# Patient Record
Sex: Female | Born: 1975
Health system: Southern US, Community
[De-identification: ages and names within clinical notes are randomized; demographics above are authoritative.]

## PROBLEM LIST (undated history)

## (undated) DIAGNOSIS — E039 Hypothyroidism, unspecified: Secondary | ICD-10-CM

## (undated) DIAGNOSIS — E119 Type 2 diabetes mellitus without complications: Secondary | ICD-10-CM

## (undated) DIAGNOSIS — C8190 Hodgkin lymphoma, unspecified, unspecified site: Secondary | ICD-10-CM

## (undated) DIAGNOSIS — J984 Other disorders of lung: Secondary | ICD-10-CM

## (undated) DIAGNOSIS — C801 Malignant (primary) neoplasm, unspecified: Secondary | ICD-10-CM

## (undated) DIAGNOSIS — I509 Heart failure, unspecified: Secondary | ICD-10-CM

## (undated) DIAGNOSIS — Z9889 Other specified postprocedural states: Secondary | ICD-10-CM

## (undated) DIAGNOSIS — B9689 Other specified bacterial agents as the cause of diseases classified elsewhere: Secondary | ICD-10-CM

## (undated) DIAGNOSIS — T4145XA Adverse effect of unspecified anesthetic, initial encounter: Secondary | ICD-10-CM

## (undated) DIAGNOSIS — T8859XA Other complications of anesthesia, initial encounter: Secondary | ICD-10-CM

## (undated) DIAGNOSIS — N760 Acute vaginitis: Secondary | ICD-10-CM

## (undated) DIAGNOSIS — I10 Essential (primary) hypertension: Secondary | ICD-10-CM

## (undated) DIAGNOSIS — R112 Nausea with vomiting, unspecified: Secondary | ICD-10-CM

## (undated) DIAGNOSIS — Z9289 Personal history of other medical treatment: Secondary | ICD-10-CM

## (undated) DIAGNOSIS — Z87898 Personal history of other specified conditions: Secondary | ICD-10-CM

## (undated) DIAGNOSIS — K635 Polyp of colon: Secondary | ICD-10-CM

## (undated) DIAGNOSIS — F329 Major depressive disorder, single episode, unspecified: Secondary | ICD-10-CM

## (undated) DIAGNOSIS — C4490 Unspecified malignant neoplasm of skin, unspecified: Secondary | ICD-10-CM

## (undated) DIAGNOSIS — E559 Vitamin D deficiency, unspecified: Secondary | ICD-10-CM

## (undated) DIAGNOSIS — F32A Depression, unspecified: Secondary | ICD-10-CM

## (undated) HISTORY — DX: Hypothyroidism, unspecified: E03.9

## (undated) HISTORY — DX: Polyp of colon: K63.5

## (undated) HISTORY — DX: Personal history of other specified conditions: Z87.898

## (undated) HISTORY — DX: Other disorders of lung: J98.4

## (undated) HISTORY — PX: INTRAUTERINE DEVICE (IUD) INSERTION: SHX5877

## (undated) HISTORY — DX: Other specified bacterial agents as the cause of diseases classified elsewhere: B96.89

## (undated) HISTORY — PX: APPENDECTOMY: SHX54

## (undated) HISTORY — DX: Other specified bacterial agents as the cause of diseases classified elsewhere: N76.0

## (undated) HISTORY — DX: Vitamin D deficiency, unspecified: E55.9

## (undated) HISTORY — PX: THYROIDECTOMY: SHX17

## (undated) HISTORY — PX: CHOLECYSTECTOMY: SHX55

## (undated) HISTORY — DX: Malignant (primary) neoplasm, unspecified: C80.1

## (undated) HISTORY — PX: SPLENECTOMY: SUR1306

## (undated) HISTORY — DX: Personal history of other medical treatment: Z92.89

## (undated) HISTORY — DX: Unspecified malignant neoplasm of skin, unspecified: C44.90

## (undated) HISTORY — DX: Depression, unspecified: F32.A

## (undated) HISTORY — DX: Major depressive disorder, single episode, unspecified: F32.9

---
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
## 2011-04-09 ENCOUNTER — Ambulatory Visit: Payer: Self-pay | Admitting: Family Medicine

## 2011-06-08 DIAGNOSIS — Z87898 Personal history of other specified conditions: Secondary | ICD-10-CM

## 2011-06-08 HISTORY — DX: Personal history of other specified conditions: Z87.898

## 2011-09-09 ENCOUNTER — Inpatient Hospital Stay: Payer: Self-pay | Admitting: Psychiatry

## 2011-09-09 LAB — CBC
HCT: 44.6 % (ref 35.0–47.0)
MCHC: 33.3 g/dL (ref 32.0–36.0)
MCV: 93 fL (ref 80–100)
Platelet: 381 10*3/uL (ref 150–440)
RDW: 13.4 % (ref 11.5–14.5)
WBC: 11.6 10*3/uL — ABNORMAL HIGH (ref 3.6–11.0)

## 2011-09-09 LAB — BASIC METABOLIC PANEL
Anion Gap: 10 (ref 7–16)
BUN: 9 mg/dL (ref 7–18)
Calcium, Total: 8.7 mg/dL (ref 8.5–10.1)
Chloride: 101 mmol/L (ref 98–107)
Creatinine: 0.45 mg/dL — ABNORMAL LOW (ref 0.60–1.30)
EGFR (African American): >60
EGFR (Non-African Amer.): >60
Glucose: 145 mg/dL — ABNORMAL HIGH (ref 65–99)
Osmolality: 273 (ref 275–301)

## 2012-09-25 ENCOUNTER — Ambulatory Visit: Payer: Self-pay | Admitting: Family Medicine

## 2013-03-15 LAB — TSH: TSH: 0.09 u[IU]/mL — AB (ref 0.41–5.90)

## 2013-04-05 DIAGNOSIS — C8110 Nodular sclerosis classical Hodgkin lymphoma, unspecified site: Secondary | ICD-10-CM | POA: Insufficient documentation

## 2013-04-05 DIAGNOSIS — E039 Hypothyroidism, unspecified: Secondary | ICD-10-CM | POA: Insufficient documentation

## 2014-03-25 ENCOUNTER — Ambulatory Visit: Payer: Self-pay | Admitting: Family Medicine

## 2014-04-08 LAB — TROPONIN I: Troponin-I: <0.02 ng/mL

## 2014-04-08 LAB — BASIC METABOLIC PANEL
Anion Gap: 9 (ref 7–16)
BUN: 12 mg/dL (ref 7–18)
CREATININE: 0.65 mg/dL (ref 0.60–1.30)
Calcium, Total: 8.6 mg/dL (ref 8.5–10.1)
Chloride: 105 mmol/L (ref 98–107)
Co2: 24 mmol/L (ref 21–32)
EGFR (African American): >60
EGFR (Non-African Amer.): >60
GLUCOSE: 173 mg/dL — AB (ref 65–99)
Osmolality: 280 (ref 275–301)
Potassium: 4.2 mmol/L (ref 3.5–5.1)
Sodium: 138 mmol/L (ref 136–145)

## 2014-04-08 LAB — CBC
HCT: 40.4 %
HGB: 13 g/dL
MCH: 29.3 pg
MCHC: 32.2 g/dL
MCV: 91 fL
Platelet: 481 x10 3/mm 3 — ABNORMAL HIGH
RBC: 4.44 X10 6/mm 3
RDW: 15 % — ABNORMAL HIGH
WBC: 20.4 x10 3/mm 3 — ABNORMAL HIGH

## 2014-04-08 LAB — D-DIMER(ARMC): D-Dimer: 1044 ng/ml

## 2014-04-09 ENCOUNTER — Inpatient Hospital Stay: Payer: Self-pay | Admitting: Internal Medicine

## 2014-04-09 LAB — INFLUENZA A,B,H1N1 - PCR (ARMC)
H1N1 flu by pcr: NOT DETECTED
Influenza A By PCR: NEGATIVE
Influenza B By PCR: NEGATIVE

## 2014-04-09 LAB — PRO B NATRIURETIC PEPTIDE: B-Type Natriuretic Peptide: 323 pg/mL — ABNORMAL HIGH (ref 0–125)

## 2014-04-10 DIAGNOSIS — I351 Nonrheumatic aortic (valve) insufficiency: Secondary | ICD-10-CM

## 2014-04-10 LAB — CBC WITH DIFFERENTIAL/PLATELET
BASOS PCT: 0.4 %
Basophil #: 0.1 10*3/uL (ref 0.0–0.1)
EOS PCT: 0 %
Eosinophil #: 0 10*3/uL (ref 0.0–0.7)
HCT: 35.8 % (ref 35.0–47.0)
HGB: 11.6 g/dL — ABNORMAL LOW (ref 12.0–16.0)
Lymphocyte #: 2.4 10*3/uL (ref 1.0–3.6)
Lymphocyte %: 6.5 %
MCH: 29.1 pg (ref 26.0–34.0)
MCHC: 32.4 g/dL (ref 32.0–36.0)
MCV: 90 fL (ref 80–100)
MONO ABS: 1.4 x10 3/mm — AB (ref 0.2–0.9)
Monocyte %: 4 %
NEUTROS ABS: 32.6 10*3/uL — AB (ref 1.4–6.5)
Neutrophil %: 89.1 %
Platelet: 444 10*3/uL — ABNORMAL HIGH (ref 150–440)
RBC: 3.99 10*6/uL (ref 3.80–5.20)
RDW: 14.9 % — ABNORMAL HIGH (ref 11.5–14.5)
WBC: 36.5 10*3/uL — AB (ref 3.6–11.0)

## 2014-04-10 LAB — COMPREHENSIVE METABOLIC PANEL
ALK PHOS: 74 U/L
ALT: 32 U/L
Albumin: 3 g/dL — ABNORMAL LOW (ref 3.4–5.0)
Anion Gap: 8 (ref 7–16)
BILIRUBIN TOTAL: 0.4 mg/dL (ref 0.2–1.0)
BUN: 14 mg/dL (ref 7–18)
CHLORIDE: 108 mmol/L — AB (ref 98–107)
Calcium, Total: 8.1 mg/dL — ABNORMAL LOW (ref 8.5–10.1)
Co2: 25 mmol/L (ref 21–32)
Creatinine: 0.65 mg/dL (ref 0.60–1.30)
EGFR (Non-African Amer.): >60
Glucose: 169 mg/dL — ABNORMAL HIGH (ref 65–99)
Osmolality: 286 (ref 275–301)
Potassium: 3.7 mmol/L (ref 3.5–5.1)
SGOT(AST): 19 U/L (ref 15–37)
Sodium: 141 mmol/L (ref 136–145)
Total Protein: 6.9 g/dL (ref 6.4–8.2)

## 2014-04-10 LAB — MAGNESIUM: Magnesium: 2 mg/dL

## 2014-04-10 LAB — PHOSPHORUS: PHOSPHORUS: 2.5 mg/dL (ref 2.5–4.9)

## 2014-04-11 ENCOUNTER — Other Ambulatory Visit: Payer: Self-pay | Admitting: Physician Assistant

## 2014-04-11 DIAGNOSIS — I5023 Acute on chronic systolic (congestive) heart failure: Secondary | ICD-10-CM

## 2014-04-11 DIAGNOSIS — J9600 Acute respiratory failure, unspecified whether with hypoxia or hypercapnia: Secondary | ICD-10-CM

## 2014-04-11 LAB — PHOSPHORUS: Phosphorus: 2.9 mg/dL (ref 2.5–4.9)

## 2014-04-11 LAB — BASIC METABOLIC PANEL
ANION GAP: 7 (ref 7–16)
BUN: 18 mg/dL (ref 7–18)
CO2: 28 mmol/L (ref 21–32)
Calcium, Total: 8.3 mg/dL — ABNORMAL LOW (ref 8.5–10.1)
Chloride: 103 mmol/L (ref 98–107)
Creatinine: 0.59 mg/dL — ABNORMAL LOW (ref 0.60–1.30)
EGFR (Non-African Amer.): >60
GLUCOSE: 297 mg/dL — AB (ref 65–99)
OSMOLALITY: 289 (ref 275–301)
Potassium: 4.3 mmol/L (ref 3.5–5.1)
SODIUM: 138 mmol/L (ref 136–145)

## 2014-04-11 LAB — EXPECTORATED SPUTUM ASSESSMENT W REFEX TO RESP CULTURE

## 2014-04-11 LAB — VANCOMYCIN, TROUGH: VANCOMYCIN, TROUGH: 9 ug/mL — AB (ref 10–20)

## 2014-04-11 LAB — MAGNESIUM: Magnesium: 1.9 mg/dL

## 2014-04-12 LAB — CBC WITH DIFFERENTIAL/PLATELET
Basophil #: 0.1 10*3/uL (ref 0.0–0.1)
Basophil %: 0.6 %
EOS PCT: 0 %
Eosinophil #: 0 10*3/uL (ref 0.0–0.7)
HCT: 36.5 % (ref 35.0–47.0)
HGB: 11.8 g/dL — ABNORMAL LOW (ref 12.0–16.0)
Lymphocyte #: 1.6 10*3/uL (ref 1.0–3.6)
Lymphocyte %: 6.5 %
MCH: 28.5 pg (ref 26.0–34.0)
MCHC: 32.2 g/dL (ref 32.0–36.0)
MCV: 89 fL (ref 80–100)
Monocyte #: 1.5 x10 3/mm — ABNORMAL HIGH (ref 0.2–0.9)
Monocyte %: 5.8 %
NEUTROS ABS: 22 10*3/uL — AB (ref 1.4–6.5)
Neutrophil %: 87.1 %
PLATELETS: 452 10*3/uL — AB (ref 150–440)
RBC: 4.12 10*6/uL (ref 3.80–5.20)
RDW: 14.5 % (ref 11.5–14.5)
WBC: 25.3 10*3/uL — ABNORMAL HIGH (ref 3.6–11.0)

## 2014-04-12 LAB — CULTURE, BLOOD (SINGLE)

## 2014-04-12 LAB — HEMOGLOBIN A1C: HEMOGLOBIN A1C: 8.3 % — AB (ref 4.2–6.3)

## 2014-04-13 LAB — CBC WITH DIFFERENTIAL/PLATELET
BASOS PCT: 0.3 %
Basophil #: 0.1 10*3/uL (ref 0.0–0.1)
EOS ABS: 0.1 10*3/uL (ref 0.0–0.7)
Eosinophil %: 0.5 %
HCT: 34 % — ABNORMAL LOW (ref 35.0–47.0)
HGB: 11.3 g/dL — AB (ref 12.0–16.0)
Lymphocyte #: 6.9 10*3/uL — ABNORMAL HIGH (ref 1.0–3.6)
Lymphocyte %: 30.1 %
MCH: 29 pg (ref 26.0–34.0)
MCHC: 33.4 g/dL (ref 32.0–36.0)
MCV: 87 fL (ref 80–100)
MONOS PCT: 11 %
Monocyte #: 2.5 x10 3/mm — ABNORMAL HIGH (ref 0.2–0.9)
NEUTROS PCT: 58.1 %
Neutrophil #: 13.3 10*3/uL — ABNORMAL HIGH (ref 1.4–6.5)
Platelet: 466 10*3/uL — ABNORMAL HIGH (ref 150–440)
RBC: 3.9 10*6/uL (ref 3.80–5.20)
RDW: 14.7 % — ABNORMAL HIGH (ref 11.5–14.5)
WBC: 22.9 10*3/uL — ABNORMAL HIGH (ref 3.6–11.0)

## 2014-04-13 LAB — BASIC METABOLIC PANEL
Anion Gap: 6 — ABNORMAL LOW (ref 7–16)
BUN: 20 mg/dL — AB (ref 7–18)
CHLORIDE: 100 mmol/L (ref 98–107)
CREATININE: 0.6 mg/dL (ref 0.60–1.30)
Calcium, Total: 8.1 mg/dL — ABNORMAL LOW (ref 8.5–10.1)
Co2: 35 mmol/L — ABNORMAL HIGH (ref 21–32)
EGFR (African American): >60
Glucose: 142 mg/dL — ABNORMAL HIGH (ref 65–99)
OSMOLALITY: 286 (ref 275–301)
POTASSIUM: 3.1 mmol/L — AB (ref 3.5–5.1)
Sodium: 141 mmol/L (ref 136–145)

## 2014-04-14 LAB — CBC WITH DIFFERENTIAL/PLATELET
Basophil #: 0.1 x10 3/mm 3
Basophil %: 0.4 %
Eosinophil #: 0.5 x10 3/mm 3
Eosinophil %: 2.7 %
HCT: 37.7 %
HGB: 12.4 g/dL
Lymphocyte %: 35.8 %
Lymphs Abs: 6.8 x10 3/mm 3 — ABNORMAL HIGH
MCH: 29.2 pg
MCHC: 33 g/dL
MCV: 89 fL
Monocyte #: 1.9 "x10 3/mm " — ABNORMAL HIGH
Monocyte %: 9.7 %
Neutrophil #: 9.8 x10 3/mm 3 — ABNORMAL HIGH
Neutrophil %: 51.4 %
Platelet: 503 x10 3/mm 3 — ABNORMAL HIGH
RBC: 4.26 X10 6/mm 3
RDW: 14.5 %
WBC: 19.1 x10 3/mm 3 — ABNORMAL HIGH

## 2014-04-14 LAB — BASIC METABOLIC PANEL WITH GFR
Anion Gap: 8
BUN: 18 mg/dL
Calcium, Total: 8.3 mg/dL — ABNORMAL LOW
Chloride: 98 mmol/L
Co2: 32 mmol/L
Creatinine: 0.61 mg/dL
EGFR (African American): >60
EGFR (Non-African Amer.): >60
Glucose: 199 mg/dL — ABNORMAL HIGH
Osmolality: 283
Potassium: 4.1 mmol/L
Sodium: 138 mmol/L

## 2014-04-14 LAB — CULTURE, BLOOD (SINGLE)

## 2014-05-20 LAB — BASIC METABOLIC PANEL
BUN: 18 mg/dL (ref 4–21)
Creatinine: 0.7 mg/dL (ref 0.5–1.1)
Glucose: 180 mg/dL
POTASSIUM: 5 mmol/L (ref 3.4–5.3)
Sodium: 137 mmol/L (ref 137–147)

## 2014-05-20 LAB — HEPATIC FUNCTION PANEL
ALT: 17 U/L (ref 7–35)
AST: 19 U/L (ref 13–35)
Alkaline Phosphatase: 92 U/L (ref 25–125)
BILIRUBIN, TOTAL: 0.2 mg/dL

## 2014-05-20 LAB — CBC AND DIFFERENTIAL
HEMATOCRIT: 39 % (ref 36–46)
HEMOGLOBIN: 13.8 g/dL (ref 12.0–16.0)
NEUTROS ABS: 11 /uL
PLATELETS: 474 10*3/uL — AB (ref 150–399)
WBC: 16.7 10*3/mL

## 2014-05-23 DIAGNOSIS — I427 Cardiomyopathy due to drug and external agent: Secondary | ICD-10-CM | POA: Insufficient documentation

## 2014-05-23 DIAGNOSIS — I5022 Chronic systolic (congestive) heart failure: Secondary | ICD-10-CM | POA: Insufficient documentation

## 2014-09-28 NOTE — Discharge Summary (Signed)
PATIENT NAME:  Lori [name], Lori [name] MR#:  [id number] DATE OF BIRTH:  07/20/1975  DATE OF ADMISSION:  04/09/2014 DATE OF DISCHARGE:  04/14/2014  PRESENTING COMPLAINT: Shortness of breath and wheezing.   DISCHARGE DIAGNOSES:  1.  Community-acquired multifocal pneumonia.  2.  Acute ventilatory-dependent respiratory failure, resolved.  3.  Congestive heart failure, systolic, with an ejection fraction of 35%.  4.  History of Hodgkin lymphoma status post radiation therapy 25 years ago, followed at [facility].  5.  Diabetes mellitus type 2. A1c 8.4.  6.  Hypothyroidism.  7.  Anxiety and depression.   CONSULTATIONS: 1.  Mariane Duval, MD, pulmonology.  2.  Minna Merritts, MD, cardiology.  3.  Muhammad A. Fletcher Anon, MD, cardiology.  4.  Minus Breeding, MD, cardiology.   PROCEDURES: 1.  Chest x-ray November 2 shows bibasilar interstitial and alveolar infiltrates suggesting pneumonia.  2.  CT angiography of the chest for PE 04/09/2014 shows no evidence for pulmonary embolus. Patchy bilateral air-space disease with small bilateral pleural effusions. Findings suggestive for multifocal pneumonia. Mediastinal, hilar, and supraclavicular lymphadenopathy. Unclear if this chest lymphadenopathy is chronic or represents an acute change. Cannot exclude recurrent neoplastic process based on this image.  3.  Chest x-ray 04/10/2014 shows significant improvement in the degree of central vascular congestion.  4.  A 2-D echocardiogram November 4 shows left ventricular ejection fraction 30% to 35%. Moderately to severely decreased global left ventricular systolic function. Elevated left atrial and left ventricular end-diastolic pressures. Pseudonormal pattern of left ventricular diastolic filling. Moderate concentric left ventricular hypertrophy. Moderate to severe mitral valve regurgitation.   HISTORY OF PRESENT ILLNESS: This very pleasant 39 year old woman with past medical history of Hodgkin lymphoma status post  radiation 25 years ago in complete remission presents with complaint of shortness of breath and wheezing ongoing for approximately 3 months. Prior to presentation, she had been given a course of oral antibiotics by her oncologist and was scheduled to have a pulmonary workup. In the Emergency Room, she was found to have an elevated white blood cell count of 21,000 and positive D-dimer at 1,044. Chest x-ray was positive for pneumonia and CT of the chest supported pneumonia with possible pulmonary vascular congestion. She was started on IV antibiotics and also received 3 L of IV fluids. She then became acutely hypoxic and in respiratory distress. She was started on BiPAP but did not tolerate BiPAP. She was placed on mechanical ventilation and admitted to the ICU. The family requested transfer to [facility], transfer request was made. Unfortunately, at the time there were no beds available.  HOSPITAL COURSE BY PROBLEM:  1.  Community-acquired multifocal pneumonia: Blood and sputum cultures were negative. The patient was treated initially with Rocephin and azithromycin and is discharged on Levaquin. She will complete a full 10-day course of antibiotics. At the time of discharge she is afebrile, oxygen saturation is greater than 90% on room air. Her white count is trending downwards. She shows no signs of continued infection. She will be following up with pulmonology at [facility] next week. 2.  Acute ventilatory-dependent respiratory failure with hypoxia: This was most likely due to volume overload in the setting of systolic congestive heart failure with an ejection fraction of 35%. She responded well to diuresis. She was intubated on November 3 and extubated on November 5 without complication. She was followed by pulmonology, critical care. At the time of discharge, she is breathing easily on room air with oxygen saturations above 90%. She does not need  supplemental oxygen. 3.  Congestive heart failure, chronic, with an  ejection fraction of 35% by 2-D echocardiogram performed this admission: The patient was followed by cardiology during her admission. She did have acute on chronic systolic heart failure, likely due to volume resuscitation in the Emergency Room. Her left ventricular function has decreased since 2014, when her EF was 50%. A recent echocardiogram on 04/04/2014 showed ejection fraction of 35%, which was consistent with the echocardiogram performed during this admission. Her systolic dysfunction is likely due to history of mantle radiation and anthracycline exposure for Hodgkin lymphoma. She will continue with Lasix 40 mg b.i.d., lisinopril 10 mg daily, Coreg 6.25 mg b.i.d. She will follow up with [facility] next week. She will likely need an ischemic evaluation as an outpatient. 4.  History of Hodgkin's lymphoma status post radiation. The patient will continue to follow up with [facility]. Note that the CTA performed during this admission showed multiple reactive lymph nodes, most likely related to community-acquired pneumonia but malignant process cannot be excluded.  5.  Diabetes mellitus type 2 with A1c of 8.4: In the hospital she was covered with Levemir and sliding scale. Due to her fairly well-controlled blood sugars as an outpatient and decreasing blood sugars at the time of discharge, she is discharged with no changes to her home regimen. She will follow up with her endocrinologist.  6.  Hypothyroidism: Stable, no changes.  7.  Anxiety and depression: Stable, no changes to home regimen.   DISCHARGE PHYSICAL EXAMINATION:  VITAL SIGNS: Temperature 97.5, pulse 97, respirations 20, blood pressure 102/68, oxygenation 96% on room air.  GENERAL: No acute distress.  CARDIOVASCULAR: Regular rate and rhythm. No murmurs, rubs, or gallops. No peripheral edema. Peripheral pulses are 2+.  PULMONARY: Lungs are clear to auscultation bilaterally with good air movement. No respiratory distress.  ABDOMEN: Soft,  nontender, nondistended. Bowel sounds are normal.  PSYCHIATRIC: Alert, oriented, good insight into her clinical condition. No signs of uncontrolled anxiety or depression.   DISCHARGE LABORATORY DATA: Sodium 138, potassium 4.1, chloride 98, bicarbonate 32, BUN 18, creatinine 0.61, blood glucose 141. White blood cells 19.1, hemoglobin 12.4, hematocrit 37.7, platelets 503,000, MCV 89.  PERTINENT LABORATORY DURING ADMISSION: Influenza A, B, H1N1 negative. Blood and sputum cultures negative. Hemoglobin A1c 8.4. BNP on presentation 323.   DISCHARGE MEDICATIONS:  1.  Levothyroxine 125 mcg 1 tablet daily.  2.  Trazodone 100 mg 1 tablet daily at bedtime.  3.  Sertraline 100 mg 2 tablets once a day.  4.  Metformin 1000 mg 1 tablet twice a day.  5.  Sitagliptin 50 mg 1 tablet twice a day.  6.  Lisinopril 10 mg 1 tablet once a day.  7.  Carvedilol 6.25 mg 1 tablet twice a day.  8.  Furosemide 40 mg 1 tablet twice a day.  9.  Guaifenesin 60 mg 1 tablet every 12 hours as needed for cough.  10.  Victoza 18 mg/3 mL subcutaneous solution 1.2 mg subcutaneously once a day.  11.  Levaquin 500 mg 1 tablet every 24 hours for 5 days.   CONDITION ON DISCHARGE: Stable.   DISPOSITION: Discharged to home with no home health needs.   DISCHARGE INSTRUCTIONS:  DIET: Low-sodium, low-fat, low-cholesterol diet.   ACTIVITY: No specific limitations: Return to work after 1 week.  TIMEFRAME FOR FOLLOWUP: The patient has followup appointments scheduled within the week at [facility] and [facility]. She will follow up within 1 to 2 weeks with her primary care  physician.   She will need to have BMP drawn this week to check renal function and electrolytes in the setting of new medications.  TIME SPENT ON DISCHARGE: 45 minutes.     ____________________________ Earleen Newport. Volanda Napoleon, MD cpw:ST D: 04/14/2014 20:58:31 ET T: 04/14/2014 22:00:44 ET JOB#: [id number]  cc: Earleen Newport. Volanda Napoleon, MD, <Dictator> Aldean Jewett MD ELECTRONICALLY SIGNED 04/19/2014 11:45

## 2014-09-28 NOTE — H&P (Signed)
PATIENT NAME:  Lori [name], Lori [name] MR#:  [id number] DATE OF BIRTH:  [birth year]  DATE OF ADMISSION:  04/09/2014  PRIMARY CARE PHYSICIAN:  Meredith Leeds, MD   ONCOLOGIST:  At [facility]  REFERRAL PHYSICIAN:  ED physician, Tally Due, MD   ADMITTING DOCTOR:  Juluis Mire, MD   CHIEF COMPLAINT: 1.  Shortness of breath with wheezing with cough and chest discomfort ongoing for the past 3 months, which worsened since yesterday.  2.  Expectoration of minimal sputum of light yellow color.   HISTORY OF PRESENT ILLNESS:  This is a 39 year old Caucasian female with a past medical history significant for Hodgkin lymphoma status post radiation therapy about 25 years ago with complete remission, history of hypothyroidism, diabetes mellitus type 2, psychiatric disorder with generalized anxiety disorder/depression, who presents to the Emergency Room accompanied by her husband with the complaints of shortness of breath with wheezing ongoing for the past 3 months. According to the patient's husband, the patient has been having shortness of breath with wheezing for the past 3 months, and she has also been having cough with minimal expectoration of light yellow sputum, for which she has seen her oncologist at [facility] last week, was given some oral antibiotics, and is scheduled to have some pulmonary workup next week. Meanwhile, her respiratory symptoms got worse, hence came to the Emergency Room for further evaluation. No history of any fever. She does have some diffuse chest pain because of coughing, otherwise denies any palpitations, dizziness, or loss of consciousness. The patient was acutely sick with acute respiratory distress, but on presentation by the ED physician's evaluation, the patient was not hypoxic. She was saturating at 94% on room air on presentation. The patient was evaluated by the ED physician and was found to have tachycardia with elevated blood pressure with a normal temperature, and  workup revealed a white blood cell count of 21,000 with D-dimer positive for 1044, chest x-ray significant for bilateral multifocal pneumonia, and  CT of the chest significant for bilateral multifocal pneumonia with some possible pulmonary vascular congestion. The patient was given Xopenex nebulizers by the ED physician and was started on IV antibiotics , Rocephin and azithromycin after blood cultures. Pt also received 3 litres of iv fluids in the ER. The hospitalist service was consulted for further evaluation and management. By the time I went to see the patient, the patient was in acute respiratory distress and having hypoxia on room air. She was getting oxygen through nasal cannula but still hypoxic, and she was anxious and agitated and her blood pressure was also elevated with tachycardia, for which she received Lopressor 2.5 mg. She continued to be agitated and hypoxic and did not tolerate the oxygen supplementation through nasal cannula. On examination, the patient was found to have diffuse rhonchi bilaterally with some rales at the bases. Hence, Solu-Medrol 125 mg IV was given, and also the patient was given 40 mg of IV Lasix. The patient was continued on Xopenex nebulizers and continued on oxygen supplementation through nasal cannula but continued to deteriorate with further respiratory distress; hence, respiratory therapist was consulted to start the patient on BiPAP. The patient was started on BiPAP, following which her oxygen saturations improved temporarily, but she could not tolerate BiPAP because of severe anxiety. Stat repeat chest x-ray was obtained in the Emergency Room, which showed bilateral pulmonary vascular congestion, and she was given 40 mg of Lasix IV as noted earlier. The patient continued to have significant respiratory distress  with hypoxia and desaturation; hence, a decision was made to intubate the patient and place on mechanical ventilation. ED physician discussed the patient's  condition with the patient's family and updated the status and they agreed for intubation. The patient's family wanted the patient to be transferred to [facility] for further management since all of her consultants are at [facility] and also up until now she has been getting her care at [facility]. ED physician contacted [facility] on-call and apprised of the patient's condition, and according to the ED physician, [facility] does not have any beds available at this time and they might have a bed available later this afternoon to accept the patient. The same was conveyed to the patient's family by the ED physician and recommended to continue care at [facility] in the ICU for now, for which the patient's family agreed. The patient is placed on mechanical ventilation. Her condition is stable at this time, and she is sedated.   PAST MEDICAL HISTORY: 1.  Hodgkin lymphoma status post radiation treatment about 25 years ago with complete resolution, under care of [facility] oncologist.  2.  Hypothyroidism.  3.  Diabetes mellitus type 2.  4.  History of psychiatric disorder with anxiety and depression.   PAST SURGICAL HISTORY:  Status post appendectomy, status post cholecystectomy, status post splenectomy, status post thyroidectomy, status post lymph node removal.   ALLERGIES:  No known drug allergies.   HOME MEDICATIONS: 1.  Levothyroxine 125 mcg tablet 1 tablet a day.  2.  Metformin 1000 mg tablet 1 tablet orally 2 times a day.  3.  Sertraline 100 mg oral tablet 2 tablets orally once a day.  4.  Sitagliptin 50 mg tablet 1 tablet orally 2 times a day.  5.  Trazodone 100 mg tablet 1 tablet orally once a day.  6.  Azithromycin and Bactrim.   SOCIAL HISTORY:  Married and lives with her husband. History of smoking about 3 to 4 cigarettes per day. Consumes alcohol on social basis. Denies any substance abuse.   FAMILY HISTORY:  Father and sister significant for psychiatric disorder.   REVIEW OF  SYSTEMS:   CONSTITUTIONAL:  Positive for weakness and fatigue for the past 3 months, which kind of worsened in the last 24 to 48 hours.  EYES:  Negative for blurred vision or double vision. No pain. No redness. No inflammation.  EARS, NOSE, AND THROAT:  Negative for tinnitus, ear pain, hearing loss, epistaxis, nasal discharge, or difficulty swallowing.  RESPIRATORY:  Ongoing cough with wheezing and shortness of breath for the past 3 months, which worsened in the past 24 to 48 hours. Chest hurts because of cough. Productive sputum with minimal sputum of light yellow color.  CARDIOVASCULAR:  Chest hurts secondary due to coughing; otherwise, denies any exertional chest pain. No pedal edema. No palpitations. No dizziness or loss of consciousness.  GASTROINTESTINAL:  Negative for nausea, vomiting, diarrhea, abdominal pain, GERD symptoms, melena, or rectal bleeding.  GENITOURINARY:  Negative for dysuria, hematuria, or frequency.  ENDOCRINE:  Negative for polyuria or polydipsia. History of hypothyroidism and takes levothyroxine.  INTEGUMENTARY:  Negative for acne, skin rash, or lesions.  MUSCULOSKELETAL:  Negative for back pain, arthritis, or swelling.  NEUROLOGICAL:  Negative for focal weakness or numbness. No history of CVA, TIA, or seizure disorder.  PSYCHIATRIC:  Positive for depression and anxiety disorder, for which she takes medications on a regular basis.  PHYSICAL EXAMINATION: VITAL SIGNS:  On arrival to the Emergency Room:  Temperature 98.1 degrees Fahrenheit, pulse rate 133 per minute, respirations 22, blood pressure 158/97, oxygen saturation 91% on room air. Further vital signs while in the Emergency Room with hypoxia:  Oxygen saturations on room air were 74% at one time with a heart rate of 152 per minute and respirations of 32 per minute. Current vital signs once intubated:  Pulse rate 129 per minute, respirations 22, blood pressure 102/71, and oxygen saturation 93% on mechanical ventilation.   GENERAL:  Well developed, well nourished. She is in acute respiratory distress, currently intubated. Examination prior to intubation as below. HEAD:  Atraumatic, normocephalic.  EYES:  Pupils are equal and react to light. No conjunctival pallor. No scleral icterus.  NOSE:  There is no drainage.  EARS:  No drainage. No external lesions.  ORAL CAVITY:  No mucosal lesions. No exudates.  NECK:  Supple. No JVD. No thyromegaly. No carotid bruit. Range of motion is normal.  RESPIRATORY:  The patient is in acute respiratory distress, using accessory muscles of respiration. Bilateral rhonchi present. Bilateral rales at the bases present.  CARDIOVASCULAR:  Tachycardia present. No murmurs appreciated. Pulses are equal at carotid, femoral, and pedal pulses. No pedal edema.  GASTROINTESTINAL:  Abdomen is soft and nontender. No hepatosplenomegaly. No tenderness, rigidity, or guarding. Bowel sounds present and equal in all 4 quadrants.  MUSCULOSKELETAL: Moving all 4 limbs well. Range of motion is normal. Strength and tone are equal bilaterally.  SKIN:  Within normal limits.  LYMPHATIC:  No lymphadenopathy.  VASCULAR:  Good dorsalis pedis and posterior tibial pulses.  NEUROLOGIC:  Cranial nerves II through XII are grossly intact. DTRs are 2+ bilaterally. No focal neurological deficits.  PSYCHIATRIC:  The patient is anxious and agitated and in respiratory distress because of hypoxia.   LABORATORY DATA:  Serum glucose 173, BNP 323, BUN 12, creatinine 0.65, sodium 138, potassium 4.2, chloride 105, bicarbonate 24, total calcium 8.6. Troponin less than 0.02. WBC 20.4, hemoglobin 13.0, hematocrit 40.4, platelet count 481,000, MCV 91. D-dimer 1044.    IMAGING:  Initial chest x-ray shows basilar interstitial and alveolar infiltrates suggestive of pneumonia or less likely edema.   Followup chest x-ray in the Emergency Room after intubation shows the endotracheal tube is 5.7 cm above the carina, vascular congestion  with bilateral airspace opacities most consistent with moderate diffuse pulmonary edema. Previously identified multifocal patchy airspace opacities are grossly stable. Small left pleural effusion.   CT of the chest shows no evidence of pulmonary embolism. Patchy bilateral airspace disease with small bilateral pleural effusions suggestive for multifocal pneumonia. Mediastinal, hilar, and supraclavicular lymphadenopathy present.   EKG:  Sinus tachycardia with ventricular rate of 134 beats per minute. No acute ST-T changes.   ASSESSMENT AND PLAN:  This is a 40 year old Caucasian female with a past medical history of Hodgkin lymphoma status post radiation treatment 25 years ago in remission, history of diabetes mellitus type 2, hypothyroidism, and generalized anxiety disorder/depression, who presents with ongoing shortness of breath, wheezing, and cough with productive sputum for the past 3 months, which worsened in the past 1 to 2 days, found to have elevated WBC with chest x-ray and CT of the chest consistent with bilateral multifocal pneumonia. While in the Emergency Room, developed acute hypoxic respiratory failure, could not tolerate BiPAP, and required intubation and mechanical ventilation.   1.  Bilateral multifocal pneumonia. Admit to the intensive care unit. Blood and sputum cultures, IV antibiotics ceftriaxone and azithromycin, oxygen supplementation, Xopenex nebulizers.  2.  Acute hypoxic respiratory failure  2/2 combinatin of b/l pneumonia, bronchospasm and fluid overload. Initially, she was maintained on nasal cannula but put on BiPAP, could not tolerate BiPAP, and had further worsening of respiratory failure with hypoxia. Hence, the patient was intubated and placed on mechanical ventilation in the Emergency Room. Continue mechanical ventilation and follow up ABGs. Pulmonary and critical care consultation for further management with the ventilator.  3.  Fluid overload, acute pulmonary edema  secondary due to IV fluids given in the Emergency Room, causing acute respiratory distress. The patient received IV Lasix. Continue IV Lasix and follow up chest x-rays.  4.  Diabetes mellitus on oral medications Victoza at home. We will continue the sliding scale insulin for now since the patient is intubated.  5.  History of Hodgkin lymphoma status post radiation treatment 25 years ago, in remission, being followed at [facility]. Continue care per oncology.  6.  Hypothyroidism on levothyroxine. Continue same.  7.  History of anxiety and depression on sertraline and trazodone. Continue same.  7.  Deep vein thrombosis prophylaxis with subcutaneous Lovenox.  8.  Gastrointestinal prophylaxis with Protonix.   CODE STATUS:  Full code.   TIME SPENT:  More than 1 hour in critical care time. Expalined to the pts family about pts critical health condition.   ____________________________ Juluis Mire, MD enr:nb D: 04/09/2014 05:59:58 ET T: 04/09/2014 06:34:47 ET JOB#: [id number]  cc: Juluis Mire, MD, <Dictator> Meredith Leeds, NP  Juluis Mire MD ELECTRONICALLY SIGNED 04/09/2014 18:29

## 2014-09-28 NOTE — Consult Note (Signed)
General Aspect Primary Cardiologist: To be established with [place] (Dr. Delora Fuel or Dr. Joeseph Amor) ____________________  39 year old female with history of stage IIB nodular Hodgkin's Lymphoma off treatment since 1988, post surgical hypothyroidism - radiation induced, DM2, multinodular goiter, HLD, chronic systolic CHF- echo 0932 EF 50%, echo 04/04/2014 EF 35% (possibly 2/2 mantle radiation vs antracycline) & depression who presented to [facility] on 11/3 with increased SOB and wheezing for the past 3 months. Cardiology is consulted for further management.  ____________________  PMH: 1. Stage IIB nodular Hodgkin's Lymphoma off treatment since 1988 2. Post surgical hypothyrodism - radiation induced 3. DM2 4. HLD 5. Chronic systolic CHF 6. Depression ____________________   Present Illness 39 year old female with the above complex problem list who presented to [facility] on 04/09/2014 with a 3 month history of progressive worsening SOB and wheezing.   Patient is s/p treatment of stage IIB nodular Hodgkin's Lymphoma off treatment since 1988 and has done well since. She did have her thyroid removed 2/2 radiation induced hypothyroidism. Her thyroid levels have remained well controlled on therapy. She is followed at [facility] regularly.   She last saw her hem/onc 10/29 and had a routine post-chemo echo done at that [facility]. Prior echo in 2014 showed EF 50% with mild LVH, and GR1DD . At her follow up on 04/04/2014 she reported a several month h/o wheezing, SOB, and a globus sensation. Echo at that time showed a further decline in LV function to EF of 35% & moderate to severe MR. She was referred to one of two [facility] cardiologists but has not yet been able to see them (Dr. Delora Fuel and Dr. Joeseph Amor). It was felt that her symptoms may be 2/2 her prior mantle radiation/anthracycline exposure. She has denied any angia type symptoms.   She presented to [facility] with the above 3 month history of SOB and DOE. Upon her  presentation she was found to have a pulse ox of 94%, WBC count 21, D-dimer 1044, CXR significant for bilateral multifocal pneumonia, and CT of the chest significant for bilateral multifocal pneumonia with some possible pulmonary vascular congestion. Blood cultures were drawn. She received IV antibiotics, inhalers, and steroids. She was placed on IV fluids. She remained tachycardic and was given Lopressor 2.5 mg IV. She also received Lasix 40 mg IV x 1. Her respiratory status continued to decline, thus she was placed on BiPAP, however she did not tolerate this and was intubated. Transfer request was made to [facility] [place]. Echo here confirms here on 04/10/2014 recent echo at [facility] on 04/04/2014 with an EF 30-35%, elevated LA and LV end-diastolic pressures, mild aortic valve sclerosis w/o stenosis, cannot adequately asses PA/RV pressures. She was extubated 11/4 AM and doing well on [place]. WBC continues to climb - on empiric ABX cultures negative. Cardiology was consulted for further management.   Physical Exam:  GEN well developed, well nourished, no acute distress, on 3L [place]   HEENT pale conjunctivae, PERRL, hearing intact to voice   NECK supple   RESP normal resp effort  rhonchi   CARD Regular rate and rhythm  Murmur   Murmur Systolic  2/6   ABD denies tenderness  no hernia  soft   EXTR negative edema   SKIN normal to palpation   NEURO cranial nerves intact   PSYCH alert, A+O to time, place, person, good insight   Review of Systems:  General: Fatigue  Weakness   Skin: No Complaints   ENT: No Complaints  Eyes: No Complaints   Neck: No Complaints   Respiratory: Short of breath  Wheezing   Cardiovascular: Dyspnea   Gastrointestinal: No Complaints   Genitourinary: No Complaints   Vascular: No Complaints   Musculoskeletal: No Complaints   Neurologic: No Complaints   Hematologic: No Complaints   Endocrine: No Complaints   Psychiatric: No Complaints   Review of Systems: All other  systems were reviewed and found to be negative   Medications/Allergies Reviewed Medications/Allergies reviewed   Family & Social History:  Family and Social History:  Family History Hypertension  psych   Social History positive tobacco (Greater than 1 year), negative ETOH, negative Illicit drugs   Place of Living Home     Extubated: Patient and order verified.  RN present during extubation   hodgkins:    diabetes:    lymphnodes removed:    appendectomy:    cholecystectomy:    spleenectomy:    thyroidectomy:   Home Medications: Medication Instructions Status  levothyroxine 125 mcg (0.125 mg) oral tablet 1 tab(s) orally once a day Active  trazodone 100 mg oral tablet 1 tab(s) orally once a day (at bedtime) Active  sertraline 100 mg oral tablet 2 tab(s) orally once a day Active  azithromycin 250 mg oral tablet 1 tab(s) orally once a day for 4 days Active  metformin 1000 mg oral tablet 1 tab(s) orally 2 times a day with meals Active  sitagliptin 50 mg oral tablet 1 tab(s) orally 2 times a day at 9:00 am and 5:00 pm Active  sulfamethoxazole-trimethoprim DS 1 tab(s) orally every 12 hours for 3 days Active   Lab Results:  Routine Micro:  03-Nov-15 00:38   Micro Text Report BLOOD CULTURE   COMMENT                   NO GROWTH IN 48 HOURS   ANTIBIOTIC                       Culture Comment NO GROWTH IN 48 HOURS  Result(s) reported on 11 Apr 2014 at 01:00AM.    00:53   Micro Text Report BLOOD CULTURE   COMMENT                   NO GROWTH IN 48 HOURS   ANTIBIOTIC                       Culture Comment NO GROWTH IN 48 HOURS  Result(s) reported on 11 Apr 2014 at 01:00AM.    05:48   Micro Text Report SPUTUM CULTURE   COMMENT                   NO GROWTH IN 48 HOURS   GRAM STAIN                FEW WHITE BLOOD CELLS   GRAM STAIN                NO ORGANISMS SEEN   GRAM STAIN                GOOD SPECIMEN-80-90% WBC   ANTIBIOTIC    Routine Chem:  05-Nov-15 04:40    Glucose, Serum  297  BUN 18  Creatinine (comp)  0.59  Sodium, Serum 138  Potassium, Serum 4.3  Chloride, Serum 103  CO2, Serum 28  Calcium (Total), Serum  8.3  Anion Gap 7  Osmolality (calc) 289  eGFR (African American) >60  eGFR (Non-African American) >60 (eGFR values <82m/min/1.73 m2 may be an indication of chronic kidney disease (CKD). Calculated eGFR, using the MRDR Study equation, is useful in  patients with stable renal function. The eGFR calculation will not be reliable in acutely ill patients when serum creatinine is changing rapidly. It is not useful in patients on dialysis. The eGFR calculation may not be applicable to patients at the low and high extremes of body sizes, pregnant women, and vegetarians.)  Magnesium, Serum 1.9 (1.8-2.4 THERAPEUTIC RANGE: 4-7 mg/dL TOXIC: > 10 mg/dL  -----------------------)  Phosphorus, Serum 2.9 (Result(s) reported on 11 Apr 2014 at 05:22AM.)  Cardiac:  02-Nov-15 22:52   Troponin I < 0.02 (0.00-0.05 0.05 ng/mL or less: NEGATIVE  Repeat testing in 3-6 hrs  if clinically indicated. >0.05 ng/mL: POTENTIAL  MYOCARDIAL INJURY. Repeat  testing in 3-6 hrs if  clinically indicated. NOTE: An increase or decrease  of 30% or more on serial  testing suggests a  clinically important change)   EKG:  EKG Interp. by me   Interpretation sinus tachycardia, 134, left atrial enlargement   Radiology Results: XRay:    04-Nov-15 06:22, Chest Portable Single View  Chest Portable Single View   REASON FOR EXAM:    vent, respiratory failure  COMMENTS:       PROCEDURE: DXR - DXR PORTABLE CHEST SINGLE VIEW  - Apr 10 2014  6:22AM     CLINICAL DATA:  Respiratory failure, current history of bilateral  pneumonia    EXAM:  PORTABLE CHEST - 1 VIEW    COMPARISON:  04/09/2014    FINDINGS:  Cardiac shadow is stable. An endotracheal tube and nasogastric  catheter are again seen. The endotracheal tube lies 5.3 cm above the  carina. The central  vascular congestion seen on the prior exam has  improved significantly. Mild bibasilar atelectatic changes are  noted. There is likely small pleural fluid present as well.     IMPRESSION:  Tubes and lines as described.    Bibasilar changes.    Significant improvement in the degree of central vascular  congestion.      Electronically Signed    By: MInez CatalinaM.D.    On: 04/10/2014 08:16         Verified By: MEverlene Farrier M.D.,  Cardiology:    04-Nov-15 09:20, Echo Doppler  Echo Doppler   REASON FOR EXAM:      COMMENTS:       [facility]TRANSTHOR)  - Apr 10 2014  9:20AM     RESULT: Echocardiogram Report    Patient Name:   Lori CrumblyDate of Exam: 04/10/2014  Medical Rec #:  [id number]       Custom1:  Date ofBirth:  [birth year]    Height:       66.0 in  Patient Age:    89years      Weight:       155.0 lb  Patient Gender: F             BSA:          1.79 m??    Indications: Respiratory Failure  Sonographer:    JSherrie SportRDCS  Referring Phys: RAzucena Freed N    Summary:   1. Sinus tachycardia - makes true estimation of EF & wall motion   difficult.   2. Left ventricular ejection fraction, by visual estimation, is 30 to  35%.   3. Moderately to severely decreased global left ventricular systolic   function.   4. Elevated left atrial and left ventricular end-diastolic pressures.   5. Moderate concentric left ventricular hypertrophy.   6. Pseudonormal pattern of LV diastolic filling.   7. Mildly dilated left atrium.   8. Moderate to severe mitral valve regurgitation.   9. Mildly increased left ventricular internal cavity size.  10. Mild aortic regurgitation.  11. Mild aortic valve sclerosis without stenosis.  12. Not visualized pulmonary artery.  13. Mildly increased left ventricular posteriorwall thickness.  14. Cannot adequately assess PA / RV pressures due to inadequate TR jet.  2D AND M-MODE MEASUREMENTS (normal ranges within  parentheses):  Left Ventricle:          Normal  IVSd (2D):      0.91 cm (0.7-1.1)  LVPWd (2D):     1.20 cm (0.7-1.1) Aorta/LA:                  Normal  LVIDd (2D):     5.10 cm (3.4-5.7) Aortic Root (2D): 2.70 cm (2.4-3.7)  LVIDs (2D):     4.19 cm           Left Atrium (2D): 3.80 cm (1.9-4.0)  LV FS (2D):     17.8 %   (>25%)  LV EF (2D):     36.9 %   (>50%)                                  Right Ventricle:                                    RVd (2D):        6.78 cm  LV SYSTOLIC FUNCTION BY 2D PLANIMETRY (MOD):  EF-A4C View: 30.2 % EF-A2C View: 30.0 % EF-Biplane: 93.8 %  LV DIASTOLIC FUNCTION:  MV Peak E:1.65 m/s E/e' Ratio: 31.40                      Decel Time: 144 msec  SPECTRAL DOPPLER ANALYSIS (where applicable):  Mitral Valve:  MV P1/2 Time: 41.76 msec  MV Area, PHT: 5.27 cm??  Aortic Valve: AoV Max Vel: 1.40 m/s AoV Peak PG: 7.9 mmHg AoV Mean PG:  LVOT Vmax: 0.95 m/s LVOT VTI:  LVOT Diameter: 2.00 cm  AoV Area, Vmax: 2.12 cm?? AoV Area, VTI:  AoV Area, Vmn:  Tricuspid Valve and PA/RV Systolic Pressure: TR Max Velocity: 2.08 m/s RA   Pressure: 10 mmHg RVSP/PASP: 27.2 mmHg  Pulmonic Valve:  PVMax Velocity: 1.09 m/s PV Max PG: 4.8 mmHg PV Mean PG:    PHYSICIAN INTERPRETATION:  Left Ventricle: The left ventricular internal cavity size was mildly   increased. LV septal wall thickness was normal. LV posterior wall   thickness was mildly increased. Moderate concentric left ventricular   hypertrophy. The left ventricular hypertrophy involves Based upon LV Mass   Index and posterior walls. Global LV systolic function was moderately to   severely decreased. Left ventricular ejection fraction,by visual   estimation, is 30 to 35%. LV Diastology was not fully evaluated. Spectral   Doppler shows pseudonormal pattern of LV diastolic filling. Elevated left   atrial and left ventricular end-diastolic pressures.  Right Ventricle: The right ventricle was not well seen. The right   ventricular  size  is normal. Global RV systolic function is hyperdynamic.  Left Atrium: The left atrium is mildly dilated.  Right Atrium: The right atrium is normal in size.  Mitral Valve: Mobility is mildly decreased for the anterior leaflet. No   evidence of mitral valve stenosis. Moderate to severe mitral valve   regurgitation is seen.  Aortic Valve: Mild aortic valve sclerosis is present, with no evidence of   aortic valve stenosis. Mild aortic valve regurgitation is seen.  Pulmonic Valve: The pulmonic valve is not well seen. Trace pulmonic valve   regurgitation.  Aorta: The aortic root is normal in size and structure.  Pulmonary Artery: The pulmonary artery is not visualized. Cannot   adequately assess PA / RV pressures due to inadequate TR jet.  Venous: The inferior vena cava was normal sized with respiratory size   variation less than 50%.    [place]  Electronically signed by [id number] Glenetta Hew  Signature Date/Time: 04/10/2014/7:01:56 PM    *** Final ***    IMPRESSION: .        Verified By: Leonie Green. HARDING, M.D.,    No Known Allergies:   Vital Signs/Nurse's Notes: **Vital Signs.:   05-Nov-15 10:00  Vital Signs Type Routine  Pulse Pulse 130  Respirations Respirations 30  Systolic BP Systolic BP 062  Diastolic BP (mmHg) Diastolic BP (mmHg) 376  Mean BP 121  Pulse Ox % Pulse Ox % 94  Pulse Ox Activity Level  At rest  Oxygen Delivery 3L; Nasal Cannula    Impression 39 year old female with history of stage IIB nodular Hodgkin's Lymphoma off treatment since 1988, post surgical hypothyroidism - radiation induced, DM2, multinodular goiter, HLD, chronic systolic CHF- echo 2831 EF 50%, echo 04/04/2014 EF 35% (possibly 2/2 mantle radiation vs antracycline) & depression who presented to [facility] on 11/3 with increased SOB and wheezing for the past 3 months. Cardiology is consulted for further management.   1. Acute on chronic systolic CHF: -Patient has had a progressive decline  in her LV function since 2014 (echo 2014, EF 50%; echo 04/04/2014, EF 35%; echo 04/10/2014, EF 30-35%) -She is s/p mantle radiation and anthracycline exposure for her nodular stage IIB Hodgkin's Lymphoma, off treatment since 1988  -She has been referred to two different [facility] cardiologist's (Dr. Delora Fuel and Dr. Joeseph Amor) for the above - yet to see either -She was placed on IV fluid upon her admisson and received 3 L IV fluids in the ED, currently -2222 -SCr is good at 0.59 -Treat with IV Lasix 40 mg bid -Limit PO intake, no IV fluids, no salt -TnI negative x 1, no angina symptoms - she will ultimately need an ischemic evaluation to r/o ischemia as a cause for her low EF, however would need to clear up infection prior  -Follow up with [facility] cardiology  2. Acute respiratory failure: -Status post extubation on 11/4 -2/2 bilateral PNA, bronchospasm, and volume overload -Doing well today -Continue diuresis as above -Inhalers and ABX per IM  3. PNA: -ABX per IM -Negative culture  4. DM: -SSI per IM  5. History of Hodgkin lymphoma status post radiation and chemo: -Being followed at [facility] per oncology  6. Post surgical hypothyroidism 2/2 radiation: -On levothyroxine   Electronic Signatures for Addendum Section:  Kathlyn Sacramento (MD) (Signed Addendum [phone] 16:44)  The patient was seen and examined. Agree with the above. 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 I switched Metoprolol to Coreg and added small dose Lisinopril.  I switched Lasix to po.  Outpatient stress test can be considered but the chance of CAD is very low.   Electronic Signatures: Kathlyn Sacramento (MD)  (Signed [phone] 16:44)  Co-Signer: General Aspect/Present Illness, History and Physical Exam, Review of System, Family & Social History, Past Medical History, Home Medications, Labs, EKG , Radiology, Allergies, Vital Signs/Nurse's Notes, Impression/Plan Rise Mu (PA-C)  (Signed  [phone] 11:06)  Authored: General Aspect/Present Illness, History and Physical Exam, Review of System, Family & Social History, Past Medical History, Home Medications, Labs, EKG , Radiology, Allergies, Vital Signs/Nurse's Notes, Impression/Plan   Last Updated: 05-Nov-15 16:44 by Kathlyn Sacramento (MD)

## 2014-09-29 NOTE — H&P (Signed)
PATIENT NAME:  Lori [name], Lori [name] [name] MR#:  [id number] DATE OF BIRTH:  04-05-1976  DATE OF ADMISSION:  09/09/2011  IDENTIFYING INFORMATION AND CHIEF COMPLAINT: This is a 39 year old woman who came voluntarily into the Emergency Room for somatic complaints as well as her psychiatric complaints.   CHIEF COMPLAINT: "I just can't stop the racing thoughts."   HISTORY OF PRESENT ILLNESS: She says that she actually was thinking she came into the Emergency Room because she was having a tingling feeling in her arm but the larger problem which she quickly admitted was that she has been having severe anxiety and depressive symptoms for at least the last few weeks. She has had racing and intrusive obsessive thoughts. These have varied from topic to topic but have included obsessive self negating thoughts, obsessive worries about her home, specific obsessions that if she were to vary her daily routine that something bad would happen to her family, Judithe Modest. They have been getting more persistent and taking up the majority of her day and preventing her normal activities. Her mood is getting more desperate, depressed and anxious. She has been having crying spells. Feeling fatigued. Has felt sick to her stomach and has not been able to eat. Not sleeping well. Only about four hours a night the last few days and not feeling rested. She has had some passive suicidal thoughts and wishes that she would just not wake up. She denies having auditory or visual hallucinations but says that at times her obsessions are so severe they almost feels like a voice in her head. She denies any homicidal thoughts. She does not know of any specific stress that might have triggered this acutely. She and her husband agree that her job has been more stressful for the past year or so but it is not clear that there is anything more recent than that that has triggered this. She has not been on any psychiatric medicine in probably nearly a year. She has been  compliant with her usual medications for her other medical problems.   PAST PSYCHIATRIC HISTORY: No previous psychiatric hospitalizations. No history of suicide attempts. She has been diagnosed with obsessive compulsive disorder in the past and has seen Dr. Nicolasa Ducking for outpatient treatment which was at least partially effective. Patient was getting both therapy and medication management. She remembers several medicines including Effexor, Zoloft, possibly Seroquel in the past. She cannot tell me which ones or which doses were most effective. She stopped seeing Dr. Nicolasa Ducking because she felt like she was better to the point of not needing any further treatment. She then discontinued her medication and her symptoms have obviously worsened since then.   PAST MEDICAL HISTORY: Patient has diabetes controlled by diet and oral medication. This developed when she was pregnant and did not get better. She is functionally hypothyroid and takes Synthroid. This is secondary to developing nodules in her thyroid which was removed. That itself could be related to a history of Hodgkin's disease several years ago successfully treated with radiation. No other specific known medical problems.   CURRENT MEDICATIONS:  1. Synthroid 125 mcg per day.  2. Janumet 50/1000 pill twice a day.   FAMILY HISTORY: Rather significant family history of mental illness. Father diagnosed with bipolar disorder and committed suicide about 10 years ago. Has at least one sister who has severe depression. Thinks that she has other uncles on her father's side who also have had severe mood disorders.   SOCIAL HISTORY: Married. Her husband is present  with her and their relationship at least from what I can see on the surface seems to be reasonably good. She has two teenage children, ages 52 and 55, who live at home. 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 She says she has had this job for years  and that generally she likes it. In the last year she got a new boss with whom she has had some conflicts but it does not sound like it has been acute or terribly severe.   SUBSTANCE ABUSE HISTORY: She says that she drinks alcohol about 4 times a week. She claims that it has never been a problem for her and no one else has ever thought it is a problem. She does admit that she has probably been drinking more frequently recently than she normally does. She denies the use of any other intoxicating drugs. She says she smokes "occasionally".   REVIEW OF SYSTEMS: Complains of fatigue, mild nausea, feeling not hungry. Feeling generally run down. Mood is feeling sad and tearful and somewhat hopeless. Has obsessive worries. Denies hallucinations. Denies suicidal intent or plan but has passive suicidal thoughts with hopelessness. Has some tingling and pain in her left arm of unclear etiology.   MENTAL STATUS EXAM: Patient was cooperative. Interviewed in the Emergency Room. Good eye contact. Psychomotor activity normal in the circumstances. Speech normal rate, tone, and volume. Affect tearful, sad, anxious. Mood stated as being anxious and bad. Thoughts appear generally lucid. A little bit halting at times. She does a pretty good job describing her symptoms. No evidence of bizarre thinking or loosening of associations. No evidence of paranoia. Denies suicidal or homicidal ideation acutely but has had some passive wishes that she was dead. Denies hallucinations. Judgment and insight seem pretty good.   PHYSICAL EXAMINATION:  GENERAL: Patient appears to be feeling a little bit sick but not in severe distress.   CURRENT VITAL SIGNS: Pulse 111, respirations 22, blood pressure 135/93, temperature 98.   GENERAL: On general examination I note that her face, neck and upper trunk are all flushed. There is no acute specific skin lesion but overall her skin appears to be dry and a little bit thickened seeming.    HEENT:  Pupils are equal and reactive. Face is symmetric. Cranial nerves all appear intact. Mucosa appeared dry.   MUSCULOSKELETAL: Strength is normal and symmetric throughout as are reflexes. She has normal sensation in her hands but a subjective feeling of tingling still in her left hand and forearm. Gait within normal limits.   LUNGS: Clear to auscultation throughout.   HEART: Tachycardic but normal sounds.   ABDOMEN: Bowel sounds normal. Nontender abdomen.    ASSESSMENT: 39 year old woman with severe depression and obsessive-compulsive symptoms. Worsening to the point of extreme loss of function, suicidal ideation passively. Not currently getting outpatient treatment and lacking immediate resources for it. Feeling out of control. Significant family history of suicide. Needs hospitalization for treatment initiation and stabilization.   TREATMENT PLAN: Admit to psychiatry. I am going to continue her on her thyroid medicine and Janumet. Labs will be checked. Blood sugars followed. For her probable diabetes we will follow her blood sugars, get a dietary consultation, continue her current diabetes medicine. For her thyroid problem, check thyroid tests, continue her current thyroid medicine. For her OCD I am going to start Zoloft 100 mg a day, also will start p.r.n. doses of Ativan and trazodone at night to help with  sleep and anxiety. She will be engaged in groups and activities on the unit, receive supportive and cognitive behavioral therapy and we will work on discharge planning. I think I am also going to go ahead and get a head CT just because of her history of Hodgkin's disease and tumors and the more acute severe onset of this illness.   DIAGNOSIS PRINCIPLE AND PRIMARY:   AXIS I: Obsessive-compulsive disorder.   SECONDARY DIAGNOSES:  AXIS I: Major depression, moderate to severe, recurrent.   AXIS II: No diagnosis.   AXIS III: Diabetes, hypothyroidism, nausea and anorexia of unclear etiology.    AXIS IV: Moderate-Chronic stress from burden of illness.   AXIS V: Functioning at time of assessment 30.    ____________________________ Gonzella Lex, MD jtc:cms D: 09/09/2011 11:07:29 ET T: 09/09/2011 11:51:02 ET JOB#: [id number]  cc: Gonzella Lex, MD, <Dictator> Gonzella Lex MD ELECTRONICALLY SIGNED 09/09/2011 14:53

## 2014-09-29 NOTE — Discharge Summary (Signed)
PATIENT NAME:  Lori [name], Lori [name] MR#:  [id number] DATE OF BIRTH:  May 15, 1976  DATE OF ADMISSION:  09/09/2011 DATE OF DISCHARGE:  09/13/2011  HOSPITAL COURSE: See the dictated History and Physical for details of admission. This 39 year old woman with a history of obsessive-compulsive disorder was admitted to the hospital with recent worsening of OCD symptoms which had become disabling to her, taking up large parts of her day, causing her a great deal of distress. In the hospital she was started back on serotonin reuptake inhibitors and the dose was titrated up quickly. She tolerated this without any side effects. She was given trazodone to assist with sleep. She attended groups and interacted appropriately. She reports now that her symptoms have greatly improved. She is having very little in the way of intrusive obsessive thoughts and no longer feels frightened or upset by them. We did inquire about her home situation and relationship with her family as sources of stress. The patient acknowledges that these have been problems in the past but currently feels like things are going fairly well. Medically she was continued on her usual medications for her diabetes and also thyroid. Blood sugars have run slightly high but not too bad. The patient will be discharged home back with her family with followup to be arranged by an outpatient psychiatrist, Dr. Ansel Bong, as well as going back to see a therapist.   LABORATORY, DIAGNOSTIC, AND RADIOLOGICAL DATA: Labs done on the fourth showed TSH 1.97, thyroxine 1.16, glucose 145, creatinine low at 0.45. CBC showed just a slightly high white count at 11.6. EKG: Possible left atrial enlargement, somewhat tachycardic. Follow-up TSH done today was elevated at 6.14.   DISCHARGE MEDICATIONS:  1. Trazodone 100 mg at bedtime.  2. Zoloft 200 mg per day.  3. Zithromax 250 mg a day for four more days.  4. Metformin 1000 mg twice a day.  5. Januvia 50 mg twice a day.   I should  mention that the Zithromax was started because of complaints of a sinus infection, which seems to have improved.   DIAGNOSIS PRINCIPLE AND PRIMARY:  AXIS I:  Obsessive-compulsive disorder.   SECONDARY DIAGNOSES:  AXIS I: Depression, not otherwise specified.   AXIS II: Deferred.   AXIS III: Diabetes, hypothyroidism, past history of Hodgkin's disease, sinus infection.   AXIS IV: Moderate. Chronic stress from burden of illness and usual family routine.   AXIS V: Functioning at time of discharge is 60.   ____________________________ Gonzella Lex, MD jtc:bjt D: 09/13/2011 11:40:00 ET T: 09/14/2011 10:27:10 ET JOB#: [id number]  cc: Gonzella Lex, MD, <Dictator> Gonzella Lex MD ELECTRONICALLY SIGNED 09/14/2011 23:02

## 2015-03-08 DIAGNOSIS — Z9289 Personal history of other medical treatment: Secondary | ICD-10-CM

## 2015-03-08 HISTORY — DX: Personal history of other medical treatment: Z92.89

## 2015-06-03 DIAGNOSIS — F329 Major depressive disorder, single episode, unspecified: Secondary | ICD-10-CM | POA: Insufficient documentation

## 2015-06-03 DIAGNOSIS — K645 Perianal venous thrombosis: Secondary | ICD-10-CM | POA: Insufficient documentation

## 2015-06-03 DIAGNOSIS — C8190 Hodgkin lymphoma, unspecified, unspecified site: Secondary | ICD-10-CM | POA: Insufficient documentation

## 2015-06-03 DIAGNOSIS — F32A Depression, unspecified: Secondary | ICD-10-CM | POA: Insufficient documentation

## 2015-06-03 DIAGNOSIS — J309 Allergic rhinitis, unspecified: Secondary | ICD-10-CM | POA: Insufficient documentation

## 2015-06-03 DIAGNOSIS — I5021 Acute systolic (congestive) heart failure: Secondary | ICD-10-CM | POA: Insufficient documentation

## 2015-06-03 DIAGNOSIS — R000 Tachycardia, unspecified: Secondary | ICD-10-CM | POA: Insufficient documentation

## 2015-06-03 DIAGNOSIS — J45909 Unspecified asthma, uncomplicated: Secondary | ICD-10-CM | POA: Insufficient documentation

## 2015-06-03 DIAGNOSIS — K219 Gastro-esophageal reflux disease without esophagitis: Secondary | ICD-10-CM | POA: Insufficient documentation

## 2015-06-03 DIAGNOSIS — Z9889 Other specified postprocedural states: Secondary | ICD-10-CM | POA: Insufficient documentation

## 2015-06-03 DIAGNOSIS — F419 Anxiety disorder, unspecified: Secondary | ICD-10-CM | POA: Insufficient documentation

## 2015-06-03 DIAGNOSIS — E119 Type 2 diabetes mellitus without complications: Secondary | ICD-10-CM | POA: Insufficient documentation

## 2015-06-04 ENCOUNTER — Encounter: Payer: Self-pay | Admitting: Physician Assistant

## 2015-06-04 ENCOUNTER — Ambulatory Visit (INDEPENDENT_AMBULATORY_CARE_PROVIDER_SITE_OTHER): Payer: BLUE CROSS/BLUE SHIELD | Admitting: Physician Assistant

## 2015-06-04 VITALS — BP 98/64 | HR 84 | Temp 98.1°F | Resp 14 | Wt 144.0 lb

## 2015-06-04 DIAGNOSIS — K219 Gastro-esophageal reflux disease without esophagitis: Secondary | ICD-10-CM | POA: Diagnosis not present

## 2015-06-04 DIAGNOSIS — F329 Major depressive disorder, single episode, unspecified: Secondary | ICD-10-CM | POA: Diagnosis not present

## 2015-06-04 DIAGNOSIS — F32A Depression, unspecified: Secondary | ICD-10-CM

## 2015-06-04 MED ORDER — ESCITALOPRAM OXALATE 10 MG PO TABS
ORAL_TABLET | ORAL | Status: DC
Start: 2015-06-04 — End: 2015-07-30

## 2015-06-04 MED ORDER — ESOMEPRAZOLE MAGNESIUM 40 MG PO CPDR: 40.0000 mg | DELAYED_RELEASE_CAPSULE | Freq: Every day | ORAL | Status: DC

## 2015-06-04 NOTE — Patient Instructions (Signed)
Major Depressive Disorder Major depressive disorder is a mental illness. It also may be called clinical depression or unipolar depression. Major depressive disorder usually causes feelings of sadness, hopelessness, or helplessness. Some people with this disorder do not feel particularly sad but lose interest in doing things they used to enjoy (anhedonia). Major depressive disorder also can cause physical symptoms. It can interfere with work, school, relationships, and other normal everyday activities. The disorder varies in severity but is longer lasting and more serious than the sadness we all feel from time to time in our lives. Major depressive disorder often is triggered by stressful life events or major life changes. Examples of these triggers include divorce, loss of your job or home, a move, and the death of a family member or close friend. Sometimes this disorder occurs for no obvious reason at all. People who have family members with major depressive disorder or bipolar disorder are at higher risk for developing this disorder, with or without life stressors. Major depressive disorder can occur at any age. It may occur just once in your life (single episode major depressive disorder). It may occur multiple times (recurrent major depressive disorder). SYMPTOMS People with major depressive disorder have either anhedonia or depressed mood on nearly a daily basis for at least 2 weeks or longer. Symptoms of depressed mood include:  Feelings of sadness (blue or down in the dumps) or emptiness.  Feelings of hopelessness or helplessness.  Tearfulness or episodes of crying (may be observed by others).  Irritability (children and adolescents). In addition to depressed mood or anhedonia or both, people with this disorder have at least four of the following symptoms:  Difficulty sleeping or sleeping too much.   Significant change (increase or decrease) in appetite or weight.   Lack of energy or  motivation.  Feelings of guilt and worthlessness.   Difficulty concentrating, remembering, or making decisions.  Unusually slow movement (psychomotor retardation) or restlessness (as observed by others).   Recurrent wishes for death, recurrent thoughts of self-harm (suicide), or a suicide attempt. People with major depressive disorder commonly have persistent negative thoughts about themselves, other people, and the world. People with severe major depressive disorder may experiencedistorted beliefs or perceptions about the world (psychotic delusions). They also may see or hear things that are not real (psychotic hallucinations). DIAGNOSIS Major depressive disorder is diagnosed through an assessment by your health care provider. Your health care provider will ask aboutaspects of your daily life, such as mood,sleep, and appetite, to see if you have the diagnostic symptoms of major depressive disorder. Your health care provider may ask about your medical history and use of alcohol or drugs, including prescription medicines. Your health care provider also may do a physical exam and blood work. This is because certain medical conditions and the use of certain substances can cause major depressive disorder-like symptoms (secondary depression). Your health care provider also may refer you to a mental health specialist for further evaluation and treatment. TREATMENT It is important to recognize the symptoms of major depressive disorder and seek treatment. The following treatments can be prescribed for this disorder:   Medicine. Antidepressant medicines usually are prescribed. Antidepressant medicines are thought to correct chemical imbalances in the brain that are commonly associated with major depressive disorder. Other types of medicine may be added if the symptoms do not respond to antidepressant medicines alone or if psychotic delusions or hallucinations occur.  Talk therapy. Talk therapy can be  helpful in treating major depressive disorder by providing   support, education, and guidance. Certain types of talk therapy also can help with negative thinking (cognitive behavioral therapy) and with relationship issues that trigger this disorder (interpersonal therapy). A mental health specialist can help determine which treatment is best for you. Most people with major depressive disorder do well with a combination of medicine and talk therapy. Treatments involving electrical stimulation of the brain can be used in situations with extremely severe symptoms or when medicine and talk therapy do not work over time. These treatments include electroconvulsive therapy, transcranial magnetic stimulation, and vagal nerve stimulation.   This information is not intended to replace advice given to you by your health care provider. Make sure you discuss any questions you have with your health care provider.   Document Released: 09/18/2012 Document Revised: 06/14/2014 Document Reviewed: 09/18/2012 Elsevier Interactive Patient Education 2016 [employer]  Escitalopram tablets What is this medicine? ESCITALOPRAM (es sye TAL oh pram) is used to treat depression and certain types of anxiety. This medicine may be used for other purposes; ask your health care provider or pharmacist if you have questions. What should I tell my health care provider before I take this medicine? They need to know if you have any of these conditions: -bipolar disorder or a family history of bipolar disorder -diabetes -glaucoma -heart disease -kidney or liver disease -receiving electroconvulsive therapy -seizures (convulsions) -suicidal thoughts, plans, or attempt by you or a family member -an unusual or allergic reaction to escitalopram, the related drug citalopram, other medicines, foods, dyes, or preservatives -pregnant or trying to become pregnant -breast-feeding How should I use this medicine? Take this medicine by mouth  with a glass of water. Follow the directions on the prescription label. You can take it with or without food. If it upsets your stomach, take it with food. Take your medicine at regular intervals. Do not take it more often than directed. Do not stop taking this medicine suddenly except upon the advice of your doctor. Stopping this medicine too quickly may cause serious side effects or your condition may worsen. A special MedGuide will be given to you by the pharmacist with each prescription and refill. Be sure to read this information carefully each time. Talk to your pediatrician regarding the use of this medicine in children. Special care may be needed. Overdosage: If you think you have taken too much of this medicine contact a poison control center or emergency room at once. NOTE: This medicine is only for you. Do not share this medicine with others. What if I miss a dose? If you miss a dose, take it as soon as you can. If it is almost time for your next dose, take only that dose. Do not take double or extra doses. What may interact with this medicine? Do not take this medicine with any of the following medications: -certain medicines for fungal infections like fluconazole, itraconazole, ketoconazole, posaconazole, voriconazole -cisapride -citalopram -dofetilide -dronedarone -linezolid -MAOIs like Carbex, Eldepryl, Marplan, Nardil, and Parnate -methylene blue (injected into a vein) -pimozide -thioridazine -ziprasidone This medicine may also interact with the following medications: -alcohol -aspirin and aspirin-like medicines -carbamazepine -certain medicines for depression, anxiety, or psychotic disturbances -certain medicines for migraine headache like almotriptan, eletriptan, frovatriptan, naratriptan, rizatriptan, sumatriptan, zolmitriptan -certain medicines for sleep -certain medicines that treat or prevent blood clots like warfarin, enoxaparin,  dalteparin -cimetidine -diuretics -fentanyl -furazolidone -isoniazid -lithium -metoprolol -NSAIDs, medicines for pain and inflammation, like ibuprofen or naproxen -other medicines that prolong the QT interval (cause an abnormal heart rhythm) -  procarbazine -rasagiline -supplements like St. John's wort, kava kava, valerian -tramadol -tryptophan This list may not describe all possible interactions. Give your health care provider a list of all the medicines, herbs, non-prescription drugs, or dietary supplements you use. Also tell them if you smoke, drink alcohol, or use illegal drugs. Some items may interact with your medicine. What should I watch for while using this medicine? Tell your doctor if your symptoms do not get better or if they get worse. Visit your doctor or health care professional for regular checks on your progress. Because it may take several weeks to see the full effects of this medicine, it is important to continue your treatment as prescribed by your doctor. Patients and their families should watch out for new or worsening thoughts of suicide or depression. Also watch out for sudden changes in feelings such as feeling anxious, agitated, panicky, irritable, hostile, aggressive, impulsive, severely restless, overly excited and hyperactive, or not being able to sleep. If this happens, especially at the beginning of treatment or after a change in dose, call your health care professional. You may get drowsy or dizzy. Do not drive, use machinery, or do anything that needs mental alertness until you know how this medicine affects you. Do not stand or sit up quickly, especially if you are an older patient. This reduces the risk of dizzy or fainting spells. Alcohol may interfere with the effect of this medicine. Avoid alcoholic drinks. Your mouth may get dry. Chewing sugarless gum or sucking hard candy, and drinking plenty of water may help. Contact your doctor if the problem does not go  away or is severe. What side effects may I notice from receiving this medicine? Side effects that you should report to your doctor or health care professional as soon as possible: -allergic reactions like skin rash, itching or hives, swelling of the face, lips, or tongue -confusion -feeling faint or lightheaded, falls -fast talking and excited feelings or actions that are out of control -hallucination, loss of contact with reality -seizures -suicidal thoughts or other mood changes -unusual bleeding or bruising Side effects that usually do not require medical attention (report to your doctor or health care professional if they continue or are bothersome): -blurred vision -changes in appetite -change in sex drive or performance -headache -increased sweating -nausea This list may not describe all possible side effects. Call your doctor for medical advice about side effects. You may report side effects to FDA at 1-800-FDA-1088. Where should I keep my medicine? Keep out of reach of children. Store at room temperature between 15 and 30 degrees C (59 and 86 degrees F). Throw away any unused medicine after the expiration date. NOTE: This sheet is a summary. It may not cover all possible information. If you have questions about this medicine, talk to your doctor, pharmacist, or health care provider.    2016, Elsevier/Gold Standard. (2012-12-19 12:32:55)   

## 2015-06-04 NOTE — Progress Notes (Signed)
Patient ID: Cindie Crumbly, female   DOB: [birth year], 39 y.o.   MRN: [id number]       Patient: Keitha Reichwein [facility] Female    DOB: Nov 05, 1975   39 y.o.   MRN: [id number] Visit Date: 06/04/2015  Today's Provider: Mar Daring, PA-C   Chief Complaint  Patient presents with  . Depression  . Emesis   Subjective:    HPI  Patient is here to discuss 2 concerns.  Depression: Patient use to be on Cymbalta which she stopped several months ago (around June) because she felt like she was on a lot of medication and wanted to see if she would be ok without this medication. She has noticed in the last month or so symptoms getting worse and thinks she probably needs to get back on it. Symptoms include: withdrawn from things and people, fatigue, excessive sleeping, no interest in things.  Depression screen PHQ 2/9 06/04/2015  Decreased Interest 3  Down, Depressed, Hopeless 3  PHQ - 2 Score 6  Altered sleeping 3  Tired, decreased energy 3  Change in appetite 3  Feeling bad or failure about yourself  2  Trouble concentrating 2  Moving slowly or fidgety/restless 2  Suicidal thoughts 1  PHQ-9 Score 22  Difficult doing work/chores Extremely dIfficult     Vomiting: In the past month especially she has noticed that she will vomit her food at least once a week, no pattern to wether it is with large portion or small portion of foods. She feels nauseas at times, decreased appetite therefore not drinking a lot of fluids. Denies any other stomach related symptoms. She has started to take OTC Prilosec-store brand about 2 weeks ago but is not sure if she has noticed a difference.    No Known Allergies Previous Medications   FARXIGA 10 MG TABS TABLET       FUROSEMIDE (LASIX) 40 MG TABLET    Take by mouth. Reported on 06/04/2015   GLIPIZIDE (GLUCOTROL XL) 5 MG 24 HR TABLET    Take by mouth.   LEVOTHYROXINE (SYNTHROID, LEVOTHROID) 100 MCG TABLET    TAKE 1 TABLET BY MOUTH ONCE DAILY ON EMPTY STOMACH 30 TO  60 MINUTES BEFORE BREAKFAST   LIRAGLUTIDE (VICTOZA) 18 MG/3ML SOPN    Inject into the skin.   LISINOPRIL (PRINIVIL,ZESTRIL) 2.5 MG TABLET    Take by mouth.   METFORMIN (GLUCOPHAGE) 1000 MG TABLET    Take by mouth.   METOPROLOL SUCCINATE (TOPROL-XL) 25 MG 24 HR TABLET    Take by mouth.   PRAVASTATIN (PRAVACHOL) 20 MG TABLET    Take by mouth.    Review of Systems  Constitutional: Positive for appetite change (not eating) and fatigue. Negative for fever, chills and unexpected weight change.  Respiratory: Negative.   Cardiovascular: Negative.   Gastrointestinal: Positive for nausea and vomiting. Negative for abdominal pain, diarrhea and constipation.  Neurological: Negative.   Psychiatric/Behavioral: Positive for sleep disturbance, dysphoric mood and agitation. Negative for suicidal ideas and self-injury. The patient is nervous/anxious.     Social History  Substance Use Topics  . Smoking status: Current Some Day Smoker -- 0.25 packs/day for 10 years    Types: Cigarettes  . Smokeless tobacco: Never Used  . Alcohol Use: Yes     Comment: occasional   Objective:   BP 98/64 mmHg  Pulse 84  Temp(Src) 98.1 F (36.7 C)  Resp 14  Wt 144 lb (65.318 kg)  LMP 06/01/2015  Physical Exam  Constitutional:  She is oriented to person, place, and time. She appears well-developed and well-nourished. No distress.  Cardiovascular: Normal rate, regular rhythm and normal heart sounds.  Exam reveals no gallop and no friction rub.   No murmur heard. Pulmonary/Chest: Effort normal and breath sounds normal. No respiratory distress. She has no wheezes. She has no rales.  Abdominal: Soft. Normal appearance and bowel sounds are normal. She exhibits no distension and no mass. There is no hepatosplenomegaly. There is no tenderness. There is no rebound, no guarding and no CVA tenderness. No hernia.  Neurological: She is alert and oriented to person, place, and time.  Skin: Skin is warm and dry. She is not  diaphoretic.  Psychiatric: Her speech is normal and behavior is normal. Judgment and thought content normal. Her mood appears not anxious. Cognition and memory are normal. She exhibits a depressed mood.        Assessment & Plan:     1. Gastroesophageal reflux disease, esophagitis presence not specified  she states that when she has the symptoms of the nausea she develops substernal epigastric pain. She states that this will occur why she is lying in bed but does occur after eating as well. She states that this sensation plus a gagging sensation in the back of her throat or what caused her to vomit. She states that she does not like to vomit so she has just abstain from eating like she normally does. This also has caused her to decrease her fluid intake as well. She feels that these things along with her depression have made her feel extremely fatigued and tired. She has been trying over-the-counter Prilosec but states she has still been having symptoms. I did discuss with her that also with not eating she may be causing worse consequences to her stomach it has the stomach still produces acid in expectation of food and without the food for the acid to start breakdown the acid just irritates the stomach lining. I will switch her from Prilosec to Nexium as below. This is so if she has started any gastric irritation there may be some healing qualities that are offered with the Nexium that or not there with the Prilosec. She voices understanding and agrees to the switch. I will see her back in 4 weeks to reevaluate. She is to call the office in the meantime if she has any adverse reactions to the medication, acute issues, questions or concerns. - esomeprazole (NEXIUM) 40 MG capsule; Take 1 capsule (40 mg total) by mouth daily.  Dispense: 30 capsule; Refill: 3  2. Depression She has been having worsening depression symptoms over the last month. She states that she has gotten where she does not like to go out,  she does not want to be around people. She states she does still continues to get up and go to work daily but that she is just faking it until she can get home and then she just wants to put on her pajamas and lay on the couch. She does state that she felt she was better controlled on the  Cymbalta but that she was still not 100% controlled. She also has tried Zoloft in the past. We did discuss that since she did not feel she was controlled completely that it may be beneficial to try another medication at this time. We will try Lexapro as below. I will slowly titrate her up on this medication to avoid adverse reactions. I will see her back in 4 weeks to reevaluate  how she is doing. She is to call the office in the meantime if she has any adverse reactions to the medications, worsening symptoms, questions or concerns. - escitalopram (LEXAPRO) 10 MG tablet; Take 1/2 tab PO q h.s x 1 week, take 1 tab PO q h.s x 1 week, then 2 tabs PO q h.s. thereafter  Dispense: 60 tablet; Refill: [place], PA-C  [facility]

## 2015-07-02 ENCOUNTER — Ambulatory Visit: Payer: BLUE CROSS/BLUE SHIELD | Admitting: Physician Assistant

## 2015-07-30 ENCOUNTER — Other Ambulatory Visit: Payer: Self-pay | Admitting: Physician Assistant

## 2015-07-31 ENCOUNTER — Ambulatory Visit: Payer: BLUE CROSS/BLUE SHIELD | Admitting: Physician Assistant

## 2015-08-04 LAB — HM PAP SMEAR: HM PAP: NEGATIVE

## 2015-08-06 ENCOUNTER — Ambulatory Visit: Payer: BLUE CROSS/BLUE SHIELD | Admitting: Physician Assistant

## 2015-09-30 ENCOUNTER — Ambulatory Visit (INDEPENDENT_AMBULATORY_CARE_PROVIDER_SITE_OTHER): Payer: BLUE CROSS/BLUE SHIELD | Admitting: Physician Assistant

## 2015-09-30 ENCOUNTER — Encounter: Payer: Self-pay | Admitting: Physician Assistant

## 2015-09-30 VITALS — BP 98/64 | HR 82 | Temp 98.2°F | Resp 16 | Wt 151.0 lb

## 2015-09-30 DIAGNOSIS — F419 Anxiety disorder, unspecified: Secondary | ICD-10-CM | POA: Diagnosis not present

## 2015-09-30 MED ORDER — ALPRAZOLAM 0.5 MG PO TABS: ORAL_TABLET | ORAL | Status: DC

## 2015-09-30 NOTE — Progress Notes (Signed)
Patient: Lori [name] Female    DOB: 17-Mar-1976   40 y.o.   MRN: [id number] Visit Date: 09/30/2015  Today's Provider: Mar Daring, PA-C   Chief Complaint  Patient presents with  . Anxiety   Subjective:    Anxiety Presents for initial visit. Onset was 1 to 4 weeks ago (She has a history of Anxiety.She has not been in any medication in past two yeats). The problem has been gradually worsening. Symptoms include confusion, decreased concentration, depressed mood, dizziness, dry mouth, excessive worry, insomnia, irritability, muscle tension, nausea, nervous/anxious behavior and restlessness. Patient reports no chest pain, feeling of choking, hyperventilation, palpitations, panic, shortness of breath or suicidal ideas. Symptoms occur most days. The severity of symptoms is severe. Nothing (No known cause she can pinpoint; worries about everyday stuff she has never worried about before) aggravates the symptoms. The patient sleeps 4 hours per night. The quality of sleep is poor. Nighttime awakenings: several (3-4).   There are no known risk factors. Her past medical history is significant for anxiety/panic attacks and depression. There is no history of anemia, arrhythmia, bipolar disorder or suicide attempts. Past treatments include benzodiazephines, lifestyle changes and SSRIs (previously used benzodiazepines but has been many years; currently on lexapro 20mg ). The treatment provided no relief. Compliance with prior treatments has been good. Compliance with medications is 76-100%.       No Known Allergies Previous Medications   ESCITALOPRAM (LEXAPRO) 10 MG TABLET    Take 2 tablets (20 mg total) by mouth at bedtime.   ESOMEPRAZOLE (NEXIUM) 40 MG CAPSULE    Take 1 capsule (40 mg total) by mouth daily.   FARXIGA 10 MG TABS TABLET       FUROSEMIDE (LASIX) 40 MG TABLET    Take by mouth. Reported on 06/04/2015   GLIPIZIDE (GLUCOTROL XL) 5 MG 24 HR TABLET    Take by mouth.   LANCET  DEVICES (ONE TOUCH DELICA LANCING DEV) MISC       LEVOTHYROXINE (SYNTHROID, LEVOTHROID) 100 MCG TABLET    TAKE 1 TABLET BY MOUTH ONCE DAILY ON EMPTY STOMACH 30 TO 60 MINUTES BEFORE BREAKFAST   LIRAGLUTIDE (VICTOZA) 18 MG/3ML SOPN    Inject into the skin.   LISINOPRIL (PRINIVIL,ZESTRIL) 2.5 MG TABLET    Take by mouth.   METFORMIN (GLUCOPHAGE) 1000 MG TABLET    Take by mouth.   METOPROLOL SUCCINATE (TOPROL-XL) 25 MG 24 HR TABLET    Take by mouth.   PRAVASTATIN (PRAVACHOL) 20 MG TABLET    Take by mouth.    Review of Systems  Constitutional: Positive for irritability. Negative for fever, appetite change and fatigue.  Respiratory: Negative for cough, chest tightness and shortness of breath.   Cardiovascular: Negative for chest pain, palpitations and leg swelling.  Gastrointestinal: Positive for nausea. Negative for vomiting and abdominal pain.  Neurological: Positive for dizziness and headaches.  Psychiatric/Behavioral: Positive for confusion, sleep disturbance and decreased concentration. Negative for suicidal ideas, self-injury and dysphoric mood. The patient is nervous/anxious and has insomnia.     Social History  Substance Use Topics  . Smoking status: Current Some Day Smoker -- 0.25 packs/day for 10 years    Types: Cigarettes  . Smokeless tobacco: Never Used  . Alcohol Use: Yes     Comment: occasional   Objective:   BP 98/64 mmHg  Pulse 82  Temp(Src) 98.2 F (36.8 C) (Oral)  Resp 16  Wt 151 lb (68.493 kg)  LMP 09/27/2015  Physical Exam  Constitutional: She appears well-developed and well-nourished. No distress.  Neck: Normal range of motion. Neck supple. No JVD present. No tracheal deviation present. No thyromegaly present.  Cardiovascular: Normal rate, regular rhythm and normal heart sounds.  Exam reveals no gallop and no friction rub.   No murmur heard. Pulmonary/Chest: Effort normal and breath sounds normal. No respiratory distress. She has no wheezes. She has no rales.    Lymphadenopathy:    She has no cervical adenopathy.  Skin: She is not diaphoretic.  Psychiatric: Her speech is normal and behavior is normal. Judgment and thought content normal. Her mood appears anxious. Cognition and memory are normal. She does not exhibit a depressed mood.  Vitals reviewed.       Assessment & Plan:     1. Acute anxiety Currently on lexapro 20mg  and feels well with exception of the high anxiety and constant worries. This is a new onset. No precipitating cause. She has had this in the past and had some medication but cannot remember the name. She states she has not had any medication for her anxiety for "a long time." Will give xanax as below. She is to take 1 full tab at bedtime. She will have another tablet that she can break in half for anxiety/panic attack during the day. She may take the second half of that tablet if needed. I will see her back in 4 weeks to evaluate how she is doing with the addition. - ALPRAZolam (XANAX) 0.5 MG tablet; Take one tab PO q h.s and 1/2 tab-1 tab PO prn for panic attack  Dispense: 60 tablet; Refill: 0       Mar Daring, PA-C  [facility]

## 2015-09-30 NOTE — Patient Instructions (Signed)
Generalized Anxiety Disorder Generalized anxiety disorder (GAD) is a mental disorder. It interferes with life functions, including relationships, work, and school. GAD is different from normal anxiety, which everyone experiences at some point in their lives in response to specific life events and activities. Normal anxiety actually helps [place] prepare for and get through these life events and activities. Normal anxiety goes away after the event or activity is over.  GAD causes anxiety that is not necessarily related to specific events or activities. It also causes excess anxiety in proportion to specific events or activities. The anxiety associated with GAD is also difficult to control. GAD can vary from mild to severe. People with severe GAD can have intense waves of anxiety with physical symptoms (panic attacks).  SYMPTOMS The anxiety and worry associated with GAD are difficult to control. This anxiety and worry are related to many life events and activities and also occur more days than not for 6 months or longer. People with GAD also have three or more of the following symptoms (one or more in children):  Restlessness.   Fatigue.  Difficulty concentrating.   Irritability.  Muscle tension.  Difficulty sleeping or unsatisfying sleep. DIAGNOSIS GAD is diagnosed through an assessment by your health care provider. Your health care provider will ask you questions aboutyour mood,physical symptoms, and events in your life. Your health care provider may ask you about your medical history and use of alcohol or drugs, including prescription medicines. Your health care provider may also do a physical exam and blood tests. Certain medical conditions and the use of certain substances can cause symptoms similar to those associated with GAD. Your health care provider may refer you to a mental health specialist for further evaluation. TREATMENT The following therapies are usually used to treat GAD:    Medication. Antidepressant medication usually is prescribed for long-term daily control. Antianxiety medicines may be added in severe cases, especially when panic attacks occur.   Talk therapy (psychotherapy). Certain types of talk therapy can be helpful in treating GAD by providing support, education, and guidance. A form of talk therapy called cognitive behavioral therapy can teach you healthy ways to think about and react to daily life events and activities.  Stress managementtechniques. These include yoga, meditation, and exercise and can be very helpful when they are practiced regularly. A mental health specialist can help determine which treatment is best for you. Some people see improvement with one therapy. However, other people require a combination of therapies.   This information is not intended to replace advice given to you by your health care provider. Make sure you discuss any questions you have with your health care provider.   Document Released: 09/18/2012 Document Revised: 06/14/2014 Document Reviewed: 09/18/2012 Elsevier Interactive Patient Education 2016 Elsevier Inc. Alprazolam tablets What is this medicine? ALPRAZOLAM (al PRAY zoe lam) is a benzodiazepine. It is used to treat anxiety and panic attacks. This medicine may be used for other purposes; ask your health care provider or pharmacist if you have questions. What should I tell my health care provider before I take this medicine? They need to know if you have any of these conditions: -an alcohol or drug abuse problem -bipolar disorder, depression, psychosis or other mental health conditions -glaucoma -kidney or liver disease -lung or breathing disease -myasthenia gravis -Parkinson's disease -porphyria -seizures or a history of seizures -suicidal thoughts -an unusual or allergic reaction to alprazolam, other benzodiazepines, foods, dyes, or preservatives -pregnant or trying to get  pregnant -breast-feeding How  should I use this medicine? Take this medicine by mouth with a glass of water. Follow the directions on the prescription label. Take your medicine at regular intervals. Do not take it more often than directed. If you have been taking this medicine regularly for some time, do not suddenly stop taking it. You must gradually reduce the dose or you may get severe side effects. Ask your doctor or health care professional for advice. Even after you stop taking this medicine it can still affect your body for several days. Talk to your pediatrician regarding the use of this medicine in children. Special care may be needed. Overdosage: If you think you have taken too much of this medicine contact a poison control center or emergency room at once. NOTE: This medicine is only for you. Do not share this medicine with others. What if I miss a dose? If you miss a dose, take it as soon as you can. If it is almost time for your next dose, take only that dose. Do not take double or extra doses. What may interact with this medicine? Do not take this medicine with any of the following medications: -certain medicines for HIV infection or AIDS -ketoconazole -itraconazole This medicine may also interact with the following medications: -birth control pills -certain macrolide antibiotics like clarithromycin, erythromycin, troleandomycin -cimetidine -cyclosporine -ergotamine -grapefruit juice -herbal or dietary supplements like kava kava, melatonin, dehydroepiandrosterone, DHEA, St. John's Wort or valerian -imatinib, STI-571 -isoniazid -levodopa -medicines for depression, anxiety, or psychotic disturbances -prescription pain medicines -rifampin, rifapentine, or rifabutin -some medicines for blood pressure or heart problems -some medicines for seizures like carbamazepine, oxcarbazepine, phenobarbital, phenytoin, primidone This list may not describe all possible interactions. Give  your health care provider a list of all the medicines, herbs, non-prescription drugs, or dietary supplements you use. Also tell them if you smoke, drink alcohol, or use illegal drugs. Some items may interact with your medicine. What should I watch for while using this medicine? Visit your doctor or health care professional for regular checks on your progress. Your body can become dependent on this medicine. Ask your doctor or health care professional if you still need to take it. You may get drowsy or dizzy. Do not drive, use machinery, or do anything that needs mental alertness until you know how this medicine affects you. To reduce the risk of dizzy and fainting spells, do not stand or sit up quickly, especially if you are an older patient. Alcohol may increase dizziness and drowsiness. Avoid alcoholic drinks. Do not treat yourself for coughs, colds or allergies without asking your doctor or health care professional for advice. Some ingredients can increase possible side effects. What side effects may I notice from receiving this medicine? Side effects that you should report to your doctor or health care professional as soon as possible: -allergic reactions like skin rash, itching or hives, swelling of the face, lips, or tongue -confusion, forgetfulness -depression -difficulty sleeping -difficulty speaking -feeling faint or lightheaded, falls -mood changes, excitability or aggressive behavior -muscle cramps -trouble passing urine or change in the amount of urine -unusually weak or tired Side effects that usually do not require medical attention (report to your doctor or health care professional if they continue or are bothersome): -change in sex drive or performance -changes in appetite This list may not describe all possible side effects. Call your doctor for medical advice about side effects. You may report side effects to FDA at 1-800-FDA-1088. Where should I keep my medicine? Keep  out of  the reach of children. This medicine can be abused. Keep your medicine in a safe place to protect it from theft. Do not share this medicine with anyone. Selling or giving away this medicine is dangerous and against the law. Store at room temperature between 20 and 25 degrees C (68 and 77 degrees F). This medicine may cause accidental overdose and death if taken by other adults, children, or pets. Mix any unused medicine with a substance like cat litter or coffee grounds. Then throw the medicine away in a sealed container like a sealed bag or a coffee can with a lid. Do not use the medicine after the expiration date. NOTE: This sheet is a summary. It may not cover all possible information. If you have questions about this medicine, talk to your doctor, pharmacist, or health care provider.    2016, Elsevier/Gold Standard. (2014-02-12 14:51:36)

## 2015-10-07 ENCOUNTER — Other Ambulatory Visit: Payer: Self-pay | Admitting: Physician Assistant

## 2015-10-07 DIAGNOSIS — K219 Gastro-esophageal reflux disease without esophagitis: Secondary | ICD-10-CM

## 2015-10-21 ENCOUNTER — Telehealth: Payer: Self-pay | Admitting: Physician Assistant

## 2015-10-21 DIAGNOSIS — F32A Depression, unspecified: Secondary | ICD-10-CM

## 2015-10-21 DIAGNOSIS — F329 Major depressive disorder, single episode, unspecified: Secondary | ICD-10-CM

## 2015-10-21 NOTE — Telephone Encounter (Signed)
Pt would like to speak with Jenni's nurse b/c she has a few questions about ALPRAZolam (XANAX) 0.5 MG tablet & escitalopram (LEXAPRO) 10 MG tablet. Thanks TNP

## 2015-10-21 NOTE — Telephone Encounter (Signed)
Spoke with Lori [name] and she is concern that she is not having any sexual desires. She reports she was already having this problem but now is worst and she is having problems at home do to this. Also with the Xanax she is taking an extra 1/2 dose to help with her sleep. She reports she take the whole tablet of Xanax before bedtime but is awake again a few hour later so that is when she takes the other half. She wants to know if you can switched her from the Lexapro to something else. I tried to scheduled patient to come and see Tawanna Sat but she declined and wants me to talk with Tawanna Sat first.Per patient she needs to work around her work schedule.  FYI: Last office visit for Depression was 06/04/15 And Anxiety was 09/30/15  Thanks,  -Joseline

## 2015-10-22 ENCOUNTER — Telehealth: Payer: Self-pay | Admitting: Physician Assistant

## 2015-10-22 MED ORDER — BUPROPION HCL ER (SR) 150 MG PO TB12: 150.0000 mg | ORAL_TABLET | Freq: Two times a day (BID) | ORAL | Status: DC

## 2015-10-22 NOTE — Telephone Encounter (Signed)
Patient advised as directed below. Voiced understanding.  Thanks,  -Zakia Sainato 

## 2015-10-22 NOTE — Telephone Encounter (Signed)
[facility] advised Lexapro was discontinued on 10/21/2015 and patient is switching to Wellbutrin.

## 2015-10-22 NOTE — Telephone Encounter (Signed)
[facility] called saying pt has Rx's for Lexapro and Wellbutrin.  Which is duplicate therapy.  Is she switching  Medication?  Please advise  [facility]  Thanks Con Memos

## 2015-10-22 NOTE — Telephone Encounter (Signed)
The only one that may not cause sexual side effects or less side effect is wellbutrin. I will have her stop taking lexapro and switch to wellbutrin. She needs to call or f/u in 2-4 weeks so we can make sure that medication is working better and that she is tolerating. I will send to pharmacy on file.

## 2015-10-24 ENCOUNTER — Encounter: Payer: Self-pay | Admitting: Physician Assistant

## 2015-10-24 ENCOUNTER — Ambulatory Visit (INDEPENDENT_AMBULATORY_CARE_PROVIDER_SITE_OTHER): Payer: BLUE CROSS/BLUE SHIELD | Admitting: Physician Assistant

## 2015-10-24 VITALS — BP 110/70 | HR 76 | Temp 97.8°F | Resp 16 | Wt 150.6 lb

## 2015-10-24 DIAGNOSIS — J0140 Acute pansinusitis, unspecified: Secondary | ICD-10-CM

## 2015-10-24 MED ORDER — AZITHROMYCIN 250 MG PO TABS: ORAL_TABLET | ORAL | Status: DC

## 2015-10-24 NOTE — Progress Notes (Signed)
Patient: Lori [name] Female    DOB: 26-Sep-1975   40 y.o.   MRN: [id number] Visit Date: 10/24/2015  Today's Provider: Mar Daring, PA-C   Chief Complaint  Patient presents with  . Sinusitis   Subjective:    Sinusitis This is a new problem. The current episode started in the past 7 days. The problem has been gradually worsening since onset. There has been no fever. Associated symptoms include congestion, coughing, headaches, a hoarse voice, sinus pressure, sneezing and a sore throat. Pertinent negatives include no chills, ear pain or shortness of breath. Treatments tried: Sudafed. The treatment provided no relief.   Her son did have similar symptoms as well. He was treated on Sunday and has improved.    No Known Allergies Previous Medications   ALPRAZOLAM (XANAX) 0.5 MG TABLET    Take one tab PO q h.s and 1/2 tab-1 tab PO prn for panic attack   BUPROPION (WELLBUTRIN SR) 150 MG 12 HR TABLET    Take 1 tablet (150 mg total) by mouth 2 (two) times daily.   ESOMEPRAZOLE (NEXIUM) 40 MG CAPSULE    TAKE 1 CAPSULE BY MOUTH DAILY.   FARXIGA 10 MG TABS TABLET       FUROSEMIDE (LASIX) 40 MG TABLET    Take by mouth. Reported on 06/04/2015   GLIPIZIDE (GLUCOTROL XL) 5 MG 24 HR TABLET    Take by mouth.   LANCET DEVICES (ONE TOUCH DELICA LANCING DEV) MISC       LEVOTHYROXINE (SYNTHROID, LEVOTHROID) 100 MCG TABLET    TAKE 1 TABLET BY MOUTH ONCE DAILY ON EMPTY STOMACH 30 TO 60 MINUTES BEFORE BREAKFAST   LIRAGLUTIDE (VICTOZA) 18 MG/3ML SOPN    Inject into the skin.   LISINOPRIL (PRINIVIL,ZESTRIL) 2.5 MG TABLET    Take by mouth.   METFORMIN (GLUCOPHAGE) 1000 MG TABLET    Take by mouth.   METOPROLOL SUCCINATE (TOPROL-XL) 25 MG 24 HR TABLET    Take by mouth.   PRAVASTATIN (PRAVACHOL) 20 MG TABLET    Take by mouth.    Review of Systems  Constitutional: Positive for fatigue. Negative for fever and chills.  HENT: Positive for congestion, hoarse voice, postnasal drip, sinus pressure,  sneezing and sore throat. Negative for ear pain and rhinorrhea.   Respiratory: Positive for cough. Negative for chest tightness, shortness of breath and wheezing.   Cardiovascular: Negative for chest pain.  Gastrointestinal: Negative for nausea, vomiting and abdominal pain.  Neurological: Positive for headaches. Negative for dizziness.    Social History  Substance Use Topics  . Smoking status: Current Some Day Smoker -- 0.25 packs/day for 10 years    Types: Cigarettes  . Smokeless tobacco: Never Used  . Alcohol Use: Yes     Comment: occasional   Objective:   BP 110/70 mmHg  Pulse 76  Temp(Src) 97.8 F (36.6 C) (Oral)  Resp 16  Wt 150 lb 9.6 oz (68.312 kg)  LMP 09/27/2015  Physical Exam  Constitutional: She appears well-developed and well-nourished. No distress.  HENT:  Head: Normocephalic and atraumatic.  Right Ear: Hearing, tympanic membrane, external ear and ear canal normal.  Left Ear: Hearing, external ear and ear canal normal. Tympanic membrane is not erythematous and not bulging. A middle ear effusion is present.  Nose: Mucosal edema and rhinorrhea present. Right sinus exhibits maxillary sinus tenderness and frontal sinus tenderness. Left sinus exhibits maxillary sinus tenderness and frontal sinus tenderness.  Mouth/Throat: Uvula is midline, oropharynx  is clear and moist and mucous membranes are normal. No oropharyngeal exudate, posterior oropharyngeal edema or posterior oropharyngeal erythema.  Neck: Normal range of motion. Neck supple. No tracheal deviation present. No thyromegaly present.  Cardiovascular: Normal rate, regular rhythm and normal heart sounds.  Exam reveals no gallop and no friction rub.   No murmur heard. Pulmonary/Chest: Effort normal and breath sounds normal. No stridor. No respiratory distress. She has no wheezes. She has no rales.  Lymphadenopathy:    She has no cervical adenopathy.  Skin: She is not diaphoretic.  Vitals reviewed.         Assessment & Plan:     1. Acute pansinusitis, recurrence not specified Worsening symptoms. I will treat with a Z-Pak as below. We also discussed use of Mucinex-D for congestion. Also discussed Delsym or Robitussin for nighttime cough if needed. She needs to make sure to stay well-hydrated and get plenty of rest. She is to call the office if symptoms fail to improve or worsen. - azithromycin (ZITHROMAX) 250 MG tablet; Take 2 tablets PO on day one, and one tablet PO daily thereafter until completed.  Dispense: 6 tablet; Refill: 0       Mar Daring, PA-C  [facility]

## 2015-10-24 NOTE — Patient Instructions (Signed)

## 2015-11-07 ENCOUNTER — Telehealth: Payer: Self-pay | Admitting: Physician Assistant

## 2015-11-07 NOTE — Telephone Encounter (Signed)
Pt called saying she is having side effects form the Wellbutrin,  Dizziness, dazed.  Please advise.[phone]  Thanks Con Memos

## 2015-11-07 NOTE — Telephone Encounter (Signed)
Pt advised-aa 

## 2015-11-07 NOTE — Telephone Encounter (Signed)
Have her take only one pill daily to see if this lessens that sensation for about a week. If not she needs to discontinue.

## 2015-11-07 NOTE — Telephone Encounter (Signed)
Please review-aa 

## 2015-11-08 ENCOUNTER — Other Ambulatory Visit: Payer: Self-pay | Admitting: Physician Assistant

## 2015-11-08 DIAGNOSIS — F419 Anxiety disorder, unspecified: Secondary | ICD-10-CM

## 2015-11-20 ENCOUNTER — Telehealth: Payer: Self-pay | Admitting: Physician Assistant

## 2015-11-20 DIAGNOSIS — F32A Depression, unspecified: Secondary | ICD-10-CM

## 2015-11-20 DIAGNOSIS — F329 Major depressive disorder, single episode, unspecified: Secondary | ICD-10-CM

## 2015-11-20 NOTE — Telephone Encounter (Signed)
Please advise. Thanks.  

## 2015-11-20 NOTE — Telephone Encounter (Signed)
LMTCB  Thanks,  -Lili Harts 

## 2015-11-20 NOTE — Telephone Encounter (Signed)
Have her stop wellbutrin. If she is already at half dose she can go to every other day x 1 week then stop. See if she is interested in starting any others or what she would like. I would probably try effexor with her next if she wants.

## 2015-11-20 NOTE — Telephone Encounter (Signed)
Pt called saying she thinks the Wellbutrin is still making her feel dizzy and in a fog, upset stomach.  She reduced the dose but still having side effects.  Her call back is [phone]  Thanks, Con Memos

## 2015-11-21 MED ORDER — SERTRALINE HCL 50 MG PO TABS: 50.0000 mg | ORAL_TABLET | Freq: Every day | ORAL | Status: DC

## 2015-11-21 NOTE — Telephone Encounter (Signed)
LMTCB  Thanks,  -Dameshia Seybold 

## 2015-11-21 NOTE — Telephone Encounter (Signed)
Patient advised as directed below by Rondall Allegra.  Thanks,  -Kasandra Fehr

## 2015-11-21 NOTE — Telephone Encounter (Signed)
Spoke with Joby advised as directed below. She said yes she would like  To try Effexor. She also wanted Tawanna Sat to know that several years ago she took Prozac and Zoloft and she did well with either of them and she got better and that's why she stopped taking those.  Thanks,  -Tyeson Tanimoto

## 2015-11-21 NOTE — Telephone Encounter (Signed)
Ok we can try zoloft again instead. I was just trying to find one with less sexual side effects. I will send zoloft to pharmacy on file for her.

## 2016-01-13 DIAGNOSIS — B373 Candidiasis of vulva and vagina: Secondary | ICD-10-CM | POA: Diagnosis not present

## 2016-01-23 ENCOUNTER — Other Ambulatory Visit: Payer: Self-pay | Admitting: Physician Assistant

## 2016-01-23 DIAGNOSIS — F32A Depression, unspecified: Secondary | ICD-10-CM

## 2016-01-23 DIAGNOSIS — F329 Major depressive disorder, single episode, unspecified: Secondary | ICD-10-CM

## 2016-01-23 NOTE — Telephone Encounter (Signed)
Last FU for anxiety was 09/30/2015. Renaldo Fiddler, CMA

## 2016-02-19 ENCOUNTER — Ambulatory Visit (INDEPENDENT_AMBULATORY_CARE_PROVIDER_SITE_OTHER): Payer: BLUE CROSS/BLUE SHIELD | Admitting: Family Medicine

## 2016-02-19 ENCOUNTER — Encounter: Payer: Self-pay | Admitting: Family Medicine

## 2016-02-19 VITALS — BP 98/58 | HR 86 | Temp 97.6°F | Resp 16 | Wt 149.2 lb

## 2016-02-19 DIAGNOSIS — J069 Acute upper respiratory infection, unspecified: Secondary | ICD-10-CM

## 2016-02-19 NOTE — Patient Instructions (Signed)
Switch to Mucinex D and add a nasal decongestant spray (3 days only). May use Delsym for cough. Let me know in 4 days if sinuses not improving. Expect cough to start soon.

## 2016-02-19 NOTE — Progress Notes (Signed)
Subjective:     Patient ID: Lori [name], female   DOB: [birth year], 40 y.o.   MRN: [id number]  HPI  Chief Complaint  Patient presents with  . Sinus Problem    Patient comes in office today with complaints of sinus pain and pressure over her entire head for the past 3 days. Patient reports that she has been taking otc Mucinex Sinus relief.   Reports scratchy throat with minimal cough. No fever or chills. States allergies not bothering her at the onset of symptoms.   Review of Systems     Objective:   Physical Exam  Constitutional: She appears well-developed and well-nourished. No distress.  Ears: T.M's intact without inflammation Sinuses: mild frontal sinus tenderness Throat: no tonsillar enlargement or exudate Neck: no cervical adenopathy Lungs: clear     Assessment:    1. Upper respiratory infection    Plan:    Discussed use of Mucinex D, nasal decongestant spray, and Delsym.

## 2016-02-23 ENCOUNTER — Other Ambulatory Visit: Payer: Self-pay | Admitting: Family Medicine

## 2016-02-23 ENCOUNTER — Telehealth: Payer: Self-pay | Admitting: Physician Assistant

## 2016-02-23 DIAGNOSIS — J019 Acute sinusitis, unspecified: Secondary | ICD-10-CM

## 2016-02-23 MED ORDER — AMOXICILLIN-POT CLAVULANATE 875-125 MG PO TABS: 1.0000 | ORAL_TABLET | Freq: Two times a day (BID) | ORAL | 0 refills | Status: DC

## 2016-02-23 NOTE — Telephone Encounter (Signed)
Update, please review over last office note and advise. KW

## 2016-02-23 NOTE — Telephone Encounter (Signed)
Reports purulent sinus drainage with PND and accompanying cough. Have sent in Augmentin.

## 2016-02-23 NOTE — Telephone Encounter (Signed)
Pt states she was seen last week for cough and congestion.  Pt states she is not feeling any better and is asking if there is something else she can take. [facility]  [id number]

## 2016-03-01 DIAGNOSIS — E890 Postprocedural hypothyroidism: Secondary | ICD-10-CM | POA: Diagnosis not present

## 2016-03-01 DIAGNOSIS — R350 Frequency of micturition: Secondary | ICD-10-CM | POA: Diagnosis not present

## 2016-03-01 DIAGNOSIS — R1031 Right lower quadrant pain: Secondary | ICD-10-CM | POA: Diagnosis not present

## 2016-03-01 DIAGNOSIS — N898 Other specified noninflammatory disorders of vagina: Secondary | ICD-10-CM | POA: Diagnosis not present

## 2016-03-01 DIAGNOSIS — E119 Type 2 diabetes mellitus without complications: Secondary | ICD-10-CM | POA: Diagnosis not present

## 2016-03-01 DIAGNOSIS — N760 Acute vaginitis: Secondary | ICD-10-CM | POA: Diagnosis not present

## 2016-03-09 DIAGNOSIS — E119 Type 2 diabetes mellitus without complications: Secondary | ICD-10-CM | POA: Diagnosis not present

## 2016-03-09 DIAGNOSIS — F17200 Nicotine dependence, unspecified, uncomplicated: Secondary | ICD-10-CM | POA: Diagnosis not present

## 2016-03-09 DIAGNOSIS — E890 Postprocedural hypothyroidism: Secondary | ICD-10-CM | POA: Diagnosis not present

## 2016-03-23 ENCOUNTER — Ambulatory Visit: Payer: BLUE CROSS/BLUE SHIELD | Admitting: Physician Assistant

## 2016-03-24 ENCOUNTER — Encounter: Payer: Self-pay | Admitting: Physician Assistant

## 2016-03-24 ENCOUNTER — Ambulatory Visit (INDEPENDENT_AMBULATORY_CARE_PROVIDER_SITE_OTHER): Payer: BLUE CROSS/BLUE SHIELD | Admitting: Physician Assistant

## 2016-03-24 VITALS — BP 112/74 | HR 88 | Temp 98.6°F | Resp 16 | Wt 151.0 lb

## 2016-03-24 DIAGNOSIS — C8110 Nodular sclerosis classical Hodgkin lymphoma, unspecified site: Secondary | ICD-10-CM

## 2016-03-24 DIAGNOSIS — R519 Headache, unspecified: Secondary | ICD-10-CM

## 2016-03-24 DIAGNOSIS — R51 Headache: Secondary | ICD-10-CM

## 2016-03-24 MED ORDER — KETOROLAC TROMETHAMINE 60 MG/2ML IM SOLN
60.0000 mg | Freq: Once | INTRAMUSCULAR | Status: AC
  Administered 2016-03-24: 60 mg via INTRAMUSCULAR

## 2016-03-24 NOTE — Patient Instructions (Signed)

## 2016-03-24 NOTE — Progress Notes (Signed)
Patient: Lori [name] Female    DOB: [birth year]   40 y.o.   MRN: [id number] Visit Date: 03/24/2016  Today's Provider: Trinna Post, PA-C   Chief Complaint  Patient presents with  . Headache   Subjective:    Headache   This is a new problem. The current episode started in the past 7 days (Started Early Sunday morning.). The problem occurs constantly. The problem has been unchanged. The pain is located in the frontal and bilateral (Also the back of her head/upper neck.) region. The pain radiates to the face (Below pt's eyes.). The pain quality is similar to prior headaches (It is a similar headache, but it has not eased up.). The quality of the pain is described as aching. The pain is at a severity of 7/10. Associated symptoms include sinus pressure. Pertinent negatives include no back pain, blurred vision, dizziness, ear pain, eye pain, eye redness, eye watering, fever, insomnia, loss of balance, neck pain, numbness, phonophobia, photophobia, scalp tenderness, seizures or weakness. Nothing aggravates the symptoms. She has tried acetaminophen, NSAIDs and cold packs for the symptoms. The treatment provided no relief.   Patient is a 40 year old female with remote history of Hodgkins lymphoma 28 years ago, 2.5 pack year smoking history quit two weeks ago, presenting today with headache not responding to OTC medications. Patient woke up at 2am on Sunday from headache. Patient reports history of headaches that respond to OTC medications, but reports having them more frequently recently. Patient thought it might be sinus infection, Took Augmentin 800/125 twice a day for three days. Iburofen 400 mg QD to TID. Goodys powder on Sunday/Monday. No relief from these interventions. Patient denies neurological symptoms to include vision change, loss of balance. No neck pain or stiffness. No incontinence. No nausea or vomiting. CT head from 2012 was negative for intracranial abnormalities. Patient  reports she sees her oncologist yearly.     No Known Allergies   Current Outpatient Prescriptions:  .  ALPRAZolam (XANAX) 0.5 MG tablet, TAKE 1 TABLET BY MOUTH AT BEDTIME AND HALF TO 1 TAB AS NEEDED FOR PANIC ATTACK, Disp: 60 tablet, Rfl: 3 .  amoxicillin-clavulanate (AUGMENTIN) 875-125 MG tablet, Take 1 tablet by mouth 2 (two) times daily., Disp: 20 tablet, Rfl: 0 .  esomeprazole (NEXIUM) 40 MG capsule, TAKE 1 CAPSULE BY MOUTH DAILY., Disp: 30 capsule, Rfl: 6 .  FARXIGA 10 MG TABS tablet, , Disp: , Rfl: 10 .  glipiZIDE (GLUCOTROL XL) 5 MG 24 hr tablet, Take by mouth., Disp: , Rfl:  .  Lancet Devices (ONE TOUCH DELICA LANCING DEV) MISC, , Disp: , Rfl:  .  levothyroxine (SYNTHROID, LEVOTHROID) 100 MCG tablet, TAKE 1 TABLET BY MOUTH ONCE DAILY ON EMPTY STOMACH 30 TO 60 MINUTES BEFORE BREAKFAST, Disp: , Rfl: 2 .  Liraglutide (VICTOZA) 18 MG/3ML SOPN, Inject into the skin., Disp: , Rfl:  .  lisinopril (PRINIVIL,ZESTRIL) 2.5 MG tablet, Take by mouth., Disp: , Rfl:  .  metFORMIN (GLUCOPHAGE-XR) 500 MG 24 hr tablet, TAKE 4 TABLETS (2,000 MG TOTAL) BY MOUTH ONCE DAILY., Disp: , Rfl: 12 .  metoprolol succinate (TOPROL-XL) 25 MG 24 hr tablet, Take by mouth., Disp: , Rfl:  .  pravastatin (PRAVACHOL) 20 MG tablet, Take by mouth., Disp: , Rfl:  .  sertraline (ZOLOFT) 50 MG tablet, TAKE 1 TABLET (50 MG TOTAL) BY MOUTH AT BEDTIME., Disp: 30 tablet, Rfl: 5  Review of Systems  Constitutional: Negative.  Negative for  fever.  HENT: Positive for sinus pressure. Negative for ear pain.   Eyes: Negative for blurred vision, photophobia, pain and redness.  Musculoskeletal: Negative for arthralgias, back pain, gait problem, joint swelling, myalgias, neck pain and neck stiffness.  Neurological: Positive for headaches. Negative for dizziness, tremors, seizures, syncope, speech difficulty, weakness, light-headedness, numbness and loss of balance.  Psychiatric/Behavioral: The patient does not have insomnia.      Social History  Substance Use Topics  . Smoking status: Former Smoker    Packs/day: 0.25    Years: 10.00    Types: Cigarettes    Quit date: 03/10/2016  . Smokeless tobacco: Never Used  . Alcohol use Yes     Comment: occasional   Objective:   BP 112/74 (BP Location: Left Arm, Patient Position: Sitting, Cuff Size: Normal)   Pulse 88   Temp 98.6 F (37 C) (Oral)   Resp 16   Wt 151 lb (68.5 kg)   LMP 03/17/2016   BMI 23.65 kg/m   Physical Exam  Constitutional: She is oriented to person, place, and time. Vital signs are normal. She appears well-developed and well-nourished.  Non-toxic appearance. No distress.  HENT:  Head: Normocephalic.  Mouth/Throat: Oropharynx is clear and moist. No oropharyngeal exudate.  Eyes: Conjunctivae and EOM are normal.  Neck: Normal range of motion and full passive range of motion without pain. Neck supple. No spinous process tenderness present. No neck rigidity. No edema and normal range of motion present. No Brudzinski's sign noted.  Cardiovascular: Normal rate and regular rhythm.   Pulmonary/Chest: Effort normal and breath sounds normal.  Neurological: She is alert and oriented to person, place, and time. She has normal reflexes. She displays normal reflexes. No cranial nerve deficit or sensory deficit. She exhibits normal muscle tone. Coordination and gait normal.  Reflex Scores:      Bicep reflexes are 2+ on the right side and 2+ on the left side.      Patellar reflexes are 2+ on the right side and 2+ on the left side. Skin: Skin is warm and dry. She is not diaphoretic.  Psychiatric: She has a normal mood and affect. Her behavior is normal.        Assessment & Plan:      Problem List Items Addressed This Visit    None    Visit Diagnoses    Nonintractable headache, unspecified chronicity pattern, unspecified headache type    -  Primary   Relevant Medications   ketorolac (TORADOL) injection 60 mg (Completed)   Other Relevant Orders    CT HEAD W & WO CONTRAST     Patient is 40 y/o female with history of Stage IIB Hodgkins lymphoma 28 years ago presenting with headache unresponsive to normal interventions. Neurological exam normal. Patient given Toradol in office. Patient counseled on alarm signs that would prompt emergency treatment. Ordered Head CT in context of history of cancer and persistent headache not responding to normal measures.   Return if symptoms worsen or fail to improve.   The entirety of the information documented in the History of Present Illness, Review of Systems and Physical Exam were personally obtained by me. Portions of this information were initially documented by Ashley Royalty, CMA and reviewed by me for thoroughness and accuracy.     Patient Instructions  General Headache Without Cause A headache is pain or discomfort felt around the head or neck area. The specific cause of a headache may not be found. There are many causes and  types of headaches. A few common ones are:  Tension headaches.  Migraine headaches.  Cluster headaches.  Chronic daily headaches. HOME CARE INSTRUCTIONS  Watch your condition for any changes. Take these steps to help with your condition: Managing Pain  Take over-the-counter and prescription medicines only as told by your health care provider.  Lie down in a dark, quiet room when you have a headache.  If directed, apply ice to the head and neck area:  Put ice in a plastic bag.  Place a towel between your skin and the bag.  Leave the ice on for 20 minutes, 2-3 times per day.  Use a heating pad or hot shower to apply heat to the head and neck area as told by your health care provider.  Keep lights dim if bright lights bother you or make your headaches worse. Eating and Drinking  Eat meals on a regular schedule.  Limit alcohol use.  Decrease the amount of caffeine you drink, or stop drinking caffeine. General Instructions  Keep all follow-up visits as  told by your health care provider. This is important.  Keep a headache journal to help find out what may trigger your headaches. For example, write down:  What you eat and drink.  How much sleep you get.  Any change to your diet or medicines.  Try massage or other relaxation techniques.  Limit stress.  Sit up straight, and do not tense your muscles.  Do not use tobacco products, including cigarettes, chewing tobacco, or e-cigarettes. If you need help quitting, ask your health care provider.  Exercise regularly as told by your health care provider.  Sleep on a regular schedule. Get 7-9 hours of sleep, or the amount recommended by your health care provider. SEEK MEDICAL CARE IF:   Your symptoms are not helped by medicine.  You have a headache that is different from the usual headache.  You have nausea or you vomit.  You have a fever. SEEK IMMEDIATE MEDICAL CARE IF:   Your headache becomes severe.  You have repeated vomiting.  You have a stiff neck.  You have a loss of vision.  You have problems with speech.  You have pain in the eye or ear.  You have muscular weakness or loss of muscle control.  You lose your balance or have trouble walking.  You feel faint or pass out.  You have confusion.   This information is not intended to replace advice given to you by your health care provider. Make sure you discuss any questions you have with your health care provider.   Document Released: 05/24/2005 Document Revised: 02/12/2015 Document Reviewed: 09/16/2014 Elsevier Interactive Patient Education 2016 [place], [facility]

## 2016-03-25 ENCOUNTER — Telehealth: Payer: Self-pay | Admitting: Physician Assistant

## 2016-03-25 NOTE — Telephone Encounter (Signed)
Spoke with patient. Patient has persistent headache despite tx in office, waking out of sleep, different H/A than normal. Advised to go to ER. Thank you.

## 2016-03-25 NOTE — Telephone Encounter (Signed)
Pt states she seen Adriana yesterday for headaches.  Pt is asking if she can get something either a Rx or  something over the counter to help with the pain from the headaches.  Pt states the pain is waking her up during the night and she is also having pain during the day. [facility].  [id number]

## 2016-03-25 NOTE — Telephone Encounter (Signed)
Pt is returning call from [place].  [id number]

## 2016-03-25 NOTE — Telephone Encounter (Signed)
Spoke with pt. Pt having persistent headache despite in office treatment with Toradol IM. Headache waking patient up out of sleep at night. History of Hodgkins. Advised patient to go to ER.

## 2016-03-31 ENCOUNTER — Ambulatory Visit
Admission: RE | Admit: 2016-03-31 | Discharge: 2016-03-31 | Disposition: A | Discharge status: Home / Self Care | Payer: BLUE CROSS/BLUE SHIELD | Source: Ambulatory Visit | Attending: Physician Assistant | Admitting: Physician Assistant

## 2016-03-31 DIAGNOSIS — R51 Headache: Secondary | ICD-10-CM | POA: Insufficient documentation

## 2016-03-31 DIAGNOSIS — R519 Headache, unspecified: Secondary | ICD-10-CM

## 2016-03-31 HISTORY — DX: Heart failure, unspecified: I50.9

## 2016-03-31 HISTORY — DX: Hodgkin lymphoma, unspecified, unspecified site: C81.90

## 2016-03-31 HISTORY — DX: Essential (primary) hypertension: I10

## 2016-03-31 HISTORY — DX: Type 2 diabetes mellitus without complications: E11.9

## 2016-03-31 LAB — POCT I-STAT CREATININE: Creatinine, Ser: 0.7 mg/dL (ref 0.44–1.00)

## 2016-03-31 MED ORDER — IOPAMIDOL (ISOVUE-300) INJECTION 61%
75.0000 mL | Freq: Once | INTRAVENOUS | Status: AC | PRN
  Administered 2016-03-31: 75 mL via INTRAVENOUS

## 2016-04-01 ENCOUNTER — Other Ambulatory Visit: Payer: Self-pay | Admitting: Physician Assistant

## 2016-04-01 ENCOUNTER — Telehealth: Payer: Self-pay

## 2016-04-01 DIAGNOSIS — R51 Headache: Principal | ICD-10-CM

## 2016-04-01 DIAGNOSIS — R0981 Nasal congestion: Secondary | ICD-10-CM

## 2016-04-01 DIAGNOSIS — R519 Headache, unspecified: Secondary | ICD-10-CM

## 2016-04-01 NOTE — Telephone Encounter (Signed)
LMTCB 04/01/2016   Thanks,   -Mickel Baas

## 2016-04-01 NOTE — Progress Notes (Signed)
Patient is 40 y.o with remote history of lymphoma seen in clinic recently for persistent headache. CT head w/wo contrast was negative for acute abnormality or lesion and stable in comparison to 2012 CT Head. Patient offered triptan, but wishes to address her sinus congestion as a possible cause of headaches. There is some mild opacification of the left mastoid air cells that seem comparable between two head Cts, but this may be cause for further evaluation by specialist. Will refer to ENT.

## 2016-04-01 NOTE — Telephone Encounter (Signed)
Pt advised.  She reports she is still having headaches.  She is not ready to start a medicine right now, but she does want to know the cause of her headaches.  She is thinking it could be her sinuses and is wondering if you would consider a referral to ENT.  She is also concerned about the neck pain as well.    Thanks,   -Mickel Baas

## 2016-04-01 NOTE — Telephone Encounter (Signed)
-----   Message from Trinna Post, [place] sent at 04/01/2016  8:19 AM EDT ----- CT Head was stable in comparison to 2012 image, no acute findings or intracranial explanation for headache. May try triptan if patient is continuing to have persistent symptoms. Please advise, thank you.

## 2016-04-05 ENCOUNTER — Telehealth: Payer: Self-pay | Admitting: Physician Assistant

## 2016-04-05 NOTE — Telephone Encounter (Signed)
Pt returned call regarding headaches/  Call back is [phone]  Thanks, Con Memos

## 2016-04-06 NOTE — Telephone Encounter (Signed)
Pt missed call from Lori [name].  Please call her back at work until 5.  Con Memos

## 2016-04-06 NOTE — Telephone Encounter (Signed)
Pt is requesting a call back.  [id number]

## 2016-04-06 NOTE — Telephone Encounter (Signed)
Left message advising pt of referral to ENT.   Thanks,   -Mickel Baas

## 2016-04-08 ENCOUNTER — Ambulatory Visit (INDEPENDENT_AMBULATORY_CARE_PROVIDER_SITE_OTHER): Payer: BLUE CROSS/BLUE SHIELD | Admitting: Physician Assistant

## 2016-04-08 ENCOUNTER — Encounter: Payer: Self-pay | Admitting: Physician Assistant

## 2016-04-08 VITALS — BP 104/64 | HR 88 | Temp 98.0°F | Resp 16 | Wt 149.0 lb

## 2016-04-08 DIAGNOSIS — N3090 Cystitis, unspecified without hematuria: Secondary | ICD-10-CM | POA: Diagnosis not present

## 2016-04-08 DIAGNOSIS — R519 Headache, unspecified: Secondary | ICD-10-CM

## 2016-04-08 DIAGNOSIS — R51 Headache: Secondary | ICD-10-CM | POA: Diagnosis not present

## 2016-04-08 LAB — POCT URINALYSIS DIPSTICK
Bilirubin, UA: NEGATIVE
Glucose, UA: 1000
Ketones, UA: NEGATIVE
Nitrite, UA: NEGATIVE
Protein, UA: NEGATIVE
Spec Grav, UA: <=1.005
Urobilinogen, UA: 0.2
pH, UA: 6.5

## 2016-04-08 MED ORDER — SULFAMETHOXAZOLE-TRIMETHOPRIM 800-160 MG PO TABS: 1.0000 | ORAL_TABLET | Freq: Two times a day (BID) | ORAL | 0 refills | Status: AC

## 2016-04-08 MED ORDER — BUTALBITAL-APAP-CAFF-COD 50-325-40-30 MG PO CAPS: 1.0000 | ORAL_CAPSULE | ORAL | 0 refills | Status: DC | PRN

## 2016-04-08 NOTE — Patient Instructions (Signed)

## 2016-04-08 NOTE — Progress Notes (Signed)
Lori [name]  Chief Complaint  Patient presents with  . Urinary Tract Infection    Started yesterday    Subjective:    Patient ID: Lori [name], female    DOB: 1975-07-05, 40 y.o.   MRN: [id number]   Urinary Tract Infection: Patient complains of burning with urination, frequency, hematuria, nausea and pain in the lower abdomen She has had symptoms for 1 day. Patient also complains of headache and stomach ache. Patient denies back pain and vaginal discharge. Patient does not have a history of recurrent UTI.  Patient does not have a history of pyelonephritis or other renal issues. Patient denies vaginal discharge and denies new sexual partners. The patient denies recent travel outside of the [place]. Patient is Type II DM on Farxiga.   Headache: Patient is still having headaches. Head CT was negative for acute intracranial abnormality. Patient has ENT appointment on 04/26/2016 for possible sinus etiologies. No new changes to pattern of headaches or neurological deficits.   Review of Systems  Constitutional: Positive for chills, diaphoresis and malaise/fatigue. Negative for fever and weight loss.  Gastrointestinal: Positive for abdominal pain and nausea. Negative for blood in stool, constipation, diarrhea, heartburn, melena and vomiting.  Genitourinary: Positive for dysuria, frequency and urgency. Negative for flank pain and hematuria.  Musculoskeletal: Negative for back pain.  Skin: Negative for itching and rash.  Neurological: Positive for headaches. Negative for weakness.       Objective:   BP 104/64 (BP Location: Left Arm, Patient Position: Sitting, Cuff Size: Normal)   Pulse 88   Temp 98 F (36.7 C) (Oral)   Resp 16   Wt 149 lb (67.6 kg)   LMP 03/17/2016   BMI 23.34 kg/m   Patient Active Problem List   Diagnosis Date Noted  . Allergic rhinitis 06/03/2015  . Anxiety 06/03/2015  . Acute systolic heart failure ([place]) 06/03/2015  . Clinical depression  06/03/2015  . Diabetes mellitus, type 2 ([place]) 06/03/2015  . Acid reflux 06/03/2015  . History of biliary T-tube placement 06/03/2015  . HD (Hodgkin's disease) ([place]) 06/03/2015  . RAD (reactive airway disease) 06/03/2015  . External hemorrhoid, thrombosed 06/03/2015  . Fast heart beat 06/03/2015  . Chronic systolic heart failure ([place]) 05/23/2014  . 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 Acquired hypothyroidism 04/05/2013    Outpatient Encounter Prescriptions as of 04/08/2016  Medication Sig Note  . ALPRAZolam (XANAX) 0.5 MG tablet TAKE 1 TABLET BY MOUTH AT BEDTIME AND HALF TO 1 TAB AS NEEDED FOR PANIC ATTACK   . esomeprazole (NEXIUM) 40 MG capsule TAKE 1 CAPSULE BY MOUTH DAILY.   Marland Kitchen FARXIGA 10 MG TABS tablet  06/04/2015: Received from: External Pharmacy  . glipiZIDE (GLUCOTROL XL) 5 MG 24 hr tablet Take by mouth. 06/03/2015: Received from: [facility]  . Lancet Devices (ONE TOUCH DELICA LANCING DEV) MISC  09/30/2015: Received from: [facility]  . levothyroxine (SYNTHROID, LEVOTHROID) 100 MCG tablet TAKE 1 TABLET BY MOUTH ONCE DAILY ON EMPTY STOMACH 30 TO 60 MINUTES BEFORE BREAKFAST 06/04/2015: Received from: External Pharmacy  . Liraglutide (VICTOZA) 18 MG/3ML SOPN Inject into the skin. 06/03/2015: prescribed by Dr. Gabriel Carina Received from: [facility]  . lisinopril (PRINIVIL,ZESTRIL) 2.5 MG tablet Take by mouth. 06/03/2015: Received from: [facility]  . metFORMIN (GLUCOPHAGE-XR) 500 MG 24 hr tablet TAKE 4 TABLETS (2,000 MG TOTAL) BY MOUTH ONCE DAILY. 02/19/2016: Received from: External Pharmacy  . metoprolol succinate (TOPROL-XL) 25 MG 24 hr  tablet Take by mouth. 06/03/2015: per cardiology Received from: [facility]  . pravastatin (PRAVACHOL) 20 MG tablet Take by mouth. 06/03/2015: Received from: [facility]  . sertraline (ZOLOFT) 50 MG tablet TAKE 1 TABLET (50 MG TOTAL)  BY MOUTH AT BEDTIME.   . butalbital-acetaminophen-caffeine (FIORICET WITH CODEINE) 50-325-40-30 MG capsule Take 1 capsule by mouth every 4 (four) hours as needed for headache.   . sulfamethoxazole-trimethoprim (BACTRIM DS) 800-160 MG tablet Take 1 tablet by mouth 2 (two) times daily.   . [DISCONTINUED] amoxicillin-clavulanate (AUGMENTIN) 875-125 MG tablet Take 1 tablet by mouth 2 (two) times daily.    No facility-administered encounter medications on file as of 04/08/2016.     No Known Allergies     Physical Exam  Constitutional: She is oriented to person, place, and time. She appears well-developed and well-nourished. No distress.  HENT:  Head: Normocephalic.  Eyes: Conjunctivae are normal. Pupils are equal, round, and reactive to light.  Cardiovascular: Normal rate and regular rhythm.   Pulmonary/Chest: Effort normal and breath sounds normal.  Abdominal: Soft. Bowel sounds are normal. There is tenderness in the suprapubic area. There is no CVA tenderness.  Neurological: She is alert and oriented to person, place, and time.  Skin: Skin is warm and dry. She is not diaphoretic.       Assessment & Plan:   Problem List Items Addressed This Visit    None    Visit Diagnoses    Cystitis    -  Primary   Relevant Medications   sulfamethoxazole-trimethoprim (BACTRIM DS) 800-160 MG tablet   Other Relevant Orders   POCT urinalysis dipstick (Completed)   Urine Culture   Nonintractable headache, unspecified chronicity pattern, unspecified headache type       Relevant Medications   butalbital-acetaminophen-caffeine (FIORICET WITH CODEINE) 50-325-40-30 MG capsule      1. Ordered in office urinalysis with culture.   Urinalysis    Component Value Date/Time   BILIRUBINUR Negative 04/08/2016 1024   PROTEINUR Negative 04/08/2016 1024   UROBILINOGEN 0.2 04/08/2016 1024   NITRITE Negative 04/08/2016 1024   LEUKOCYTESUR moderate (2+) (A) 04/08/2016 1024    2. Instructed the patient to  complete the following course of antibiotics:  Bactrim 160/800 mg BID x 3 days, may take pyridium for symptoms for up to two days with abx.   3. Patient should report back to clinic or to emergency room should they experience extreme low back pain, fever, nausea, vomiting.  4. Persistent headaches. Patient willing to try fioricet. Counseled patient on sedation, rebound headaches, addiction potential. Patient agrees to use medication as needed, no more than 6 capsules daily. Patient should keep appointment with ENT.  Return if symptoms worsen or fail to improve.   The entirety of the information documented in the History of Present Illness, Review of Systems and Physical Exam were personally obtained by me. Portions of this information were initially documented by Ashley Royalty, CMA and reviewed by me for thoroughness and accuracy.      Patient Instructions  Urinary Tract Infection Urinary tract infections (UTIs) can develop anywhere along your urinary tract. Your urinary tract is your body's drainage system for removing wastes and extra water. Your urinary tract includes two kidneys, two ureters, a bladder, and a urethra. Your kidneys are a pair of bean-shaped organs. Each kidney is about the size of your fist. They are located below your ribs, one on each side of your spine. CAUSES Infections are caused by microbes, which are  microscopic organisms, including fungi, viruses, and bacteria. These organisms are so small that they can only be seen through a microscope. Bacteria are the microbes that most commonly cause UTIs. SYMPTOMS  Symptoms of UTIs may vary by age and gender of the patient and by the location of the infection. Symptoms in young women typically include a frequent and intense urge to urinate and a painful, burning feeling in the bladder or urethra during urination. Older women and men are more likely to be tired, shaky, and weak and have muscle aches and abdominal pain. A fever may  mean the infection is in your kidneys. Other symptoms of a kidney infection include pain in your back or sides below the ribs, nausea, and vomiting. DIAGNOSIS To diagnose a UTI, your caregiver will ask you about your symptoms. Your caregiver will also ask you to provide a urine sample. The urine sample will be tested for bacteria and white blood cells. White blood cells are made by your body to help fight infection. TREATMENT  Typically, UTIs can be treated with medication. Because most UTIs are caused by a bacterial infection, they usually can be treated with the use of antibiotics. The choice of antibiotic and length of treatment depend on your symptoms and the type of bacteria causing your infection. HOME CARE INSTRUCTIONS  If you were prescribed antibiotics, take them exactly as your caregiver instructs you. Finish the medication even if you feel better after you have only taken some of the medication.  Drink enough water and fluids to keep your urine clear or pale yellow.  Avoid caffeine, tea, and carbonated beverages. They tend to irritate your bladder.  Empty your bladder often. Avoid holding urine for long periods of time.  Empty your bladder before and after sexual intercourse.  After a bowel movement, women should cleanse from front to back. Use each tissue only once. SEEK MEDICAL CARE IF:   You have back pain.  You develop a fever.  Your symptoms do not begin to resolve within 3 days. SEEK IMMEDIATE MEDICAL CARE IF:   You have severe back pain or lower abdominal pain.  You develop chills.  You have nausea or vomiting.  You have continued burning or discomfort with urination. MAKE SURE YOU:   Understand these instructions.  Will watch your condition.  Will get help right away if you are not doing well or get worse.   This information is not intended to replace advice given to you by your health care provider. Make sure you discuss any questions you have with your  health care provider.   Document Released: 03/03/2005 Document Revised: 02/12/2015 Document Reviewed: 07/02/2011 Elsevier Interactive Patient Education [employer].

## 2016-04-09 LAB — URINE CULTURE

## 2016-04-09 LAB — PLEASE NOTE

## 2016-04-12 ENCOUNTER — Telehealth: Payer: Self-pay

## 2016-04-12 NOTE — Telephone Encounter (Signed)
Patient was advised of culture she states that not of her symptoms have improved, she reports dysuria, frequency and inability to completely empty her bladder. Please advise if you want patient to return back to office. KW

## 2016-04-12 NOTE — Telephone Encounter (Signed)
-----   Message from Trinna Post, [place] sent at 04/12/2016  8:16 AM EST ----- Culture did come back negative, but patient symptoms consistent with cystitis. Likely has finished Bactrim by now. No more antibiotics needed unless patient still symptomatic.

## 2016-04-12 NOTE — Telephone Encounter (Signed)
[email or web address]-KW 

## 2016-04-12 NOTE — Telephone Encounter (Signed)
Patient has been advised, awaiting call back from [email or web address]

## 2016-04-12 NOTE — Telephone Encounter (Signed)
Have sent message to her endocrinologist, as this may be side effect of her diabetes medication. Waiting on this for how to proceed.

## 2016-04-13 NOTE — Telephone Encounter (Deleted)
-----   Message from Trinna Post, [place] sent at 04/13/2016  1:29 PM EST ----- No response from Dr. Gabriel Carina, but discussed with Dr. Rosanna Randy. Can we have patient please stop her Wilder Glade and see if this helps resolution of symptoms. Would like patient to check her blood sugar every day and followup in clinic in two weeks with blood sugar log. Please advise, thank you.

## 2016-04-13 NOTE — Telephone Encounter (Signed)
No response from Dr. Gabriel Carina, but discussed with Dr. Rosanna Randy. Can we have patient please stop her Wilder Glade and see if this helps resolution of symptoms. Would like patient to check her blood sugar every day and followup in clinic in two weeks with blood sugar log. Please advise, thank you.

## 2016-04-13 NOTE — Telephone Encounter (Signed)
[email or web address]-KW 

## 2016-04-14 ENCOUNTER — Telehealth: Payer: Self-pay | Admitting: Physician Assistant

## 2016-04-14 NOTE — Telephone Encounter (Signed)
Pt called wanting [place] to fax the paper to [phone] that she faxed to [place] today regarding the icd9 code for the CT that Adriana order for her..   Pt's call back is [phone]  Thanks Con Memos

## 2016-04-15 ENCOUNTER — Other Ambulatory Visit: Payer: Self-pay

## 2016-04-15 DIAGNOSIS — K219 Gastro-esophageal reflux disease without esophagitis: Secondary | ICD-10-CM

## 2016-04-15 DIAGNOSIS — F339 Major depressive disorder, recurrent, unspecified: Secondary | ICD-10-CM

## 2016-04-15 MED ORDER — ESOMEPRAZOLE MAGNESIUM 40 MG PO CPDR: 40.0000 mg | DELAYED_RELEASE_CAPSULE | Freq: Every day | ORAL | 3 refills | Status: DC

## 2016-04-15 MED ORDER — SERTRALINE HCL 50 MG PO TABS: 50.0000 mg | ORAL_TABLET | Freq: Every day | ORAL | 3 refills | Status: DC

## 2016-04-15 NOTE — Telephone Encounter (Signed)
"  Pt called wanting [place] to fax the paper to [phone] that she faxed to [place] today regarding the icd9 code for the CT that Adriana order for her..   Pt's call back is [phone]"   ICD 9 code: 784.0  ICD 10 code: R51  "nonintractable headache, unspecified chronicity pattern, unspecified headache type"  Please advise patient, thank you.

## 2016-04-15 NOTE — Addendum Note (Signed)
Addended by: Minette Headland on: 04/15/2016 11:45 AM   Modules accepted: Orders

## 2016-04-16 NOTE — Telephone Encounter (Signed)
Patient has been advised she states that she will call back to arrange appt in office. KW

## 2016-04-16 NOTE — Telephone Encounter (Signed)
Patient form has been completed with ICD and CPT codes. Advised patient that this is not being released to [place] [facility] and is being faxed directly to patient via secure fax. Thank you.

## 2016-04-16 NOTE — Telephone Encounter (Signed)
This message was still in the nurse box. Have this been done if you know?-aa

## 2016-05-03 ENCOUNTER — Telehealth: Payer: Self-pay | Admitting: Physician Assistant

## 2016-05-03 DIAGNOSIS — D105 Benign neoplasm of other parts of oropharynx: Secondary | ICD-10-CM | POA: Diagnosis not present

## 2016-05-03 DIAGNOSIS — R51 Headache: Secondary | ICD-10-CM | POA: Diagnosis not present

## 2016-05-03 NOTE — Telephone Encounter (Signed)
Pt contacted office for refill request on the following medications:butalbital-acetaminophen-caffeine (FIORICET WITH CODEINE) 50-325-40-30 MG capsule.  [facility].  BM:[id number]

## 2016-05-03 NOTE — Telephone Encounter (Signed)
Patient seen in clinic for headaches. Negative CT scan. Patient saw ENT on 04/26/2016 for what she thought might be sinusitis contributing to her headaches. What did they say? Fioricet not meant to be preventive medication as it is addictive and may cause rebound headaches when used long term. If patient is continuing to have headaches, we might do neurology referral. Please advise, thank you.

## 2016-05-03 NOTE — Telephone Encounter (Signed)
Please review. KW 

## 2016-05-03 NOTE — Telephone Encounter (Signed)
[id number] 

## 2016-05-06 NOTE — Telephone Encounter (Signed)
Perfect, thank you

## 2016-05-06 NOTE — Telephone Encounter (Signed)
Patient states that she did not see ENT till 11/27 and he believed that symptoms were migraine related. Patient states that doctor instructed her to keep a food diary to rule out possible food allergy and to follow up with him in 2 weeks. Patient states that she was started on Imitrex 50mg .Amparo [place]

## 2016-06-03 ENCOUNTER — Other Ambulatory Visit: Payer: Self-pay | Admitting: Physician Assistant

## 2016-06-03 DIAGNOSIS — F419 Anxiety disorder, unspecified: Secondary | ICD-10-CM

## 2016-06-03 NOTE — Telephone Encounter (Signed)
Called into [facility]. Renaldo Fiddler, CMA

## 2016-06-09 DIAGNOSIS — E119 Type 2 diabetes mellitus without complications: Secondary | ICD-10-CM | POA: Diagnosis not present

## 2016-06-15 DIAGNOSIS — F17200 Nicotine dependence, unspecified, uncomplicated: Secondary | ICD-10-CM | POA: Diagnosis not present

## 2016-06-15 DIAGNOSIS — E119 Type 2 diabetes mellitus without complications: Secondary | ICD-10-CM | POA: Diagnosis not present

## 2016-06-15 DIAGNOSIS — E890 Postprocedural hypothyroidism: Secondary | ICD-10-CM | POA: Diagnosis not present

## 2016-06-15 LAB — HEMOGLOBIN A1C: Hemoglobin A1C: 7.5

## 2016-07-07 ENCOUNTER — Telehealth: Payer: Self-pay | Admitting: Physician Assistant

## 2016-07-07 DIAGNOSIS — H5203 Hypermetropia, bilateral: Secondary | ICD-10-CM | POA: Diagnosis not present

## 2016-07-07 DIAGNOSIS — E118 Type 2 diabetes mellitus with unspecified complications: Secondary | ICD-10-CM

## 2016-07-07 NOTE — Telephone Encounter (Signed)
Ok to place referral.

## 2016-07-07 NOTE — Telephone Encounter (Signed)
Pt stated that she no longer wants to see the endocrinologist she was seeing in [place] and is requesting a referral to [facility]. Dr. Chalmers Cater Phone# [phone]. Please advise. Thanks TNP

## 2016-07-12 ENCOUNTER — Other Ambulatory Visit: Payer: Self-pay | Admitting: Physician Assistant

## 2016-07-12 DIAGNOSIS — F419 Anxiety disorder, unspecified: Secondary | ICD-10-CM

## 2016-07-12 NOTE — Telephone Encounter (Signed)
Called in. Lori [name], CMA  

## 2016-07-15 ENCOUNTER — Encounter: Payer: Self-pay | Admitting: Physician Assistant

## 2016-07-22 DIAGNOSIS — D105 Benign neoplasm of other parts of oropharynx: Secondary | ICD-10-CM | POA: Diagnosis not present

## 2016-07-26 DIAGNOSIS — R51 Headache: Secondary | ICD-10-CM | POA: Diagnosis not present

## 2016-07-26 DIAGNOSIS — D105 Benign neoplasm of other parts of oropharynx: Secondary | ICD-10-CM | POA: Diagnosis not present

## 2016-07-30 DIAGNOSIS — E039 Hypothyroidism, unspecified: Secondary | ICD-10-CM | POA: Diagnosis not present

## 2016-07-30 DIAGNOSIS — I427 Cardiomyopathy due to drug and external agent: Secondary | ICD-10-CM | POA: Diagnosis not present

## 2016-07-30 DIAGNOSIS — T451X5A Adverse effect of antineoplastic and immunosuppressive drugs, initial encounter: Secondary | ICD-10-CM | POA: Diagnosis not present

## 2016-07-30 DIAGNOSIS — I5022 Chronic systolic (congestive) heart failure: Secondary | ICD-10-CM | POA: Diagnosis not present

## 2016-09-01 DIAGNOSIS — I10 Essential (primary) hypertension: Secondary | ICD-10-CM | POA: Diagnosis not present

## 2016-09-01 DIAGNOSIS — E1165 Type 2 diabetes mellitus with hyperglycemia: Secondary | ICD-10-CM | POA: Diagnosis not present

## 2016-09-01 DIAGNOSIS — E039 Hypothyroidism, unspecified: Secondary | ICD-10-CM | POA: Diagnosis not present

## 2016-09-01 DIAGNOSIS — E7800 Pure hypercholesterolemia, unspecified: Secondary | ICD-10-CM | POA: Diagnosis not present

## 2016-09-06 ENCOUNTER — Telehealth: Payer: Self-pay

## 2016-09-06 NOTE — Telephone Encounter (Signed)
Pt of [place] states that she is having issues with periods. She previously had a cycle for 17 days sometimes heavy with a lot of clotting and stopped for 4 days then started again. This is the pt's 44rd mirena IUD and she has not experienced these sxs before. Advised pt to schedule appt due to unknown cause and transferred to front desk.

## 2016-09-16 ENCOUNTER — Ambulatory Visit: Payer: Self-pay | Admitting: Obstetrics and Gynecology

## 2016-09-17 DIAGNOSIS — J309 Allergic rhinitis, unspecified: Secondary | ICD-10-CM | POA: Diagnosis not present

## 2016-09-17 DIAGNOSIS — J029 Acute pharyngitis, unspecified: Secondary | ICD-10-CM | POA: Diagnosis not present

## 2016-09-20 ENCOUNTER — Encounter: Payer: Self-pay | Admitting: Family Medicine

## 2016-09-20 ENCOUNTER — Ambulatory Visit (INDEPENDENT_AMBULATORY_CARE_PROVIDER_SITE_OTHER): Payer: BLUE CROSS/BLUE SHIELD | Admitting: Family Medicine

## 2016-09-20 VITALS — BP 118/68 | HR 87 | Temp 98.2°F | Resp 16 | Wt 158.2 lb

## 2016-09-20 DIAGNOSIS — R300 Dysuria: Secondary | ICD-10-CM

## 2016-09-20 DIAGNOSIS — J039 Acute tonsillitis, unspecified: Secondary | ICD-10-CM | POA: Diagnosis not present

## 2016-09-20 LAB — POCT URINALYSIS DIPSTICK
BILIRUBIN UA: NEGATIVE
Blood, UA: NEGATIVE
GLUCOSE UA: 100
Ketones, UA: NEGATIVE
Leukocytes, UA: NEGATIVE
Nitrite, UA: NEGATIVE
Protein, UA: NEGATIVE
UROBILINOGEN UA: 0.2 U/dL
pH, UA: 6.5 (ref 5.0–8.0)

## 2016-09-20 LAB — POCT RAPID STREP A (OFFICE): Rapid Strep A Screen: NEGATIVE

## 2016-09-20 MED ORDER — AMOXICILLIN-POT CLAVULANATE 875-125 MG PO TABS: 1.0000 | ORAL_TABLET | Freq: Two times a day (BID) | ORAL | 0 refills | Status: DC

## 2016-09-20 NOTE — Patient Instructions (Signed)
Let me know if new symptoms or not getting better. 

## 2016-09-20 NOTE — Progress Notes (Signed)
Subjective:     Patient ID: Lori [name], female   DOB: 11-29-1975, 41 y.o.   MRN: [id number]  HPI  Chief Complaint  Patient presents with  . Sore Throat    Patient comes in office today with complaints of sore throat and body aches that began on 4/13/8. Patient states that on 4/13 she was seen at [facility] with a temperature of 102, both strep test and flu came back negative. Patient was advised to take otc nasal spray, zyrtec and Ibuprofen. Patient reports today swelling of lymph glands and yellow green mucous coming up when clearing her throat.   . Dysuria    Patient complains of burning with urination for the past 24hrs.   Has been using Zyrtec and ibuprofen for her sx. Minimal sinus congestion without cough.   Review of Systems     Objective:   Physical Exam  Constitutional: She appears well-developed and well-nourished. No distress.  Ears: T.M's intact without inflammation Throat: mild tonsillar enlargement with exudate Neck: right anterior cervical tenderness Lungs: clear     Assessment:    1. Dysuria - POCT urinalysis dipstick  2. Tonsillitis - POCT rapid strep A - amoxicillin-clavulanate (AUGMENTIN) 875-125 MG tablet; Take 1 tablet by mouth 2 (two) times daily.  Dispense: 20 tablet; Refill: 0    Plan:    Further f/u if not improving

## 2016-09-21 ENCOUNTER — Ambulatory Visit: Payer: Self-pay | Admitting: Obstetrics and Gynecology

## 2016-09-22 ENCOUNTER — Ambulatory Visit: Payer: Self-pay | Admitting: Obstetrics and Gynecology

## 2016-10-04 ENCOUNTER — Telehealth: Payer: Self-pay | Admitting: Physician Assistant

## 2016-10-04 NOTE — Telephone Encounter (Signed)
Pt states she was seen 2 weeks ago.  Pt is is having burning and itching in her vaginal area.  Pt is requesting a Rx to help with this.  [facility].  EC#[phone]/[id number]

## 2016-10-05 ENCOUNTER — Other Ambulatory Visit: Payer: Self-pay | Admitting: Family Medicine

## 2016-10-05 MED ORDER — FLUCONAZOLE 150 MG PO TABS: 150.0000 mg | ORAL_TABLET | Freq: Once | ORAL | 1 refills | Status: DC

## 2016-10-05 NOTE — Telephone Encounter (Signed)
Diflucan has been sent in  

## 2016-10-05 NOTE — Telephone Encounter (Signed)
lov 09/20/16, please review and advise.KW

## 2016-10-05 NOTE — Telephone Encounter (Signed)
Patient has been advised. KW 

## 2016-10-07 ENCOUNTER — Ambulatory Visit: Payer: Self-pay | Admitting: Obstetrics and Gynecology

## 2016-10-07 DIAGNOSIS — C8110 Nodular sclerosis classical Hodgkin lymphoma, unspecified site: Secondary | ICD-10-CM | POA: Diagnosis not present

## 2016-10-07 DIAGNOSIS — F39 Unspecified mood [affective] disorder: Secondary | ICD-10-CM | POA: Diagnosis not present

## 2016-10-07 DIAGNOSIS — T451X5A Adverse effect of antineoplastic and immunosuppressive drugs, initial encounter: Secondary | ICD-10-CM | POA: Diagnosis not present

## 2016-10-07 DIAGNOSIS — I5022 Chronic systolic (congestive) heart failure: Secondary | ICD-10-CM | POA: Diagnosis not present

## 2016-10-07 DIAGNOSIS — E119 Type 2 diabetes mellitus without complications: Secondary | ICD-10-CM | POA: Diagnosis not present

## 2016-10-07 DIAGNOSIS — I427 Cardiomyopathy due to drug and external agent: Secondary | ICD-10-CM | POA: Diagnosis not present

## 2016-10-07 DIAGNOSIS — N938 Other specified abnormal uterine and vaginal bleeding: Secondary | ICD-10-CM | POA: Diagnosis not present

## 2016-10-07 DIAGNOSIS — F17290 Nicotine dependence, other tobacco product, uncomplicated: Secondary | ICD-10-CM | POA: Diagnosis not present

## 2016-10-07 DIAGNOSIS — D649 Anemia, unspecified: Secondary | ICD-10-CM | POA: Diagnosis not present

## 2016-10-07 DIAGNOSIS — E039 Hypothyroidism, unspecified: Secondary | ICD-10-CM | POA: Diagnosis not present

## 2016-10-07 DIAGNOSIS — N920 Excessive and frequent menstruation with regular cycle: Secondary | ICD-10-CM | POA: Diagnosis not present

## 2016-10-07 DIAGNOSIS — E890 Postprocedural hypothyroidism: Secondary | ICD-10-CM | POA: Diagnosis not present

## 2016-10-14 ENCOUNTER — Encounter: Payer: Self-pay | Admitting: Obstetrics and Gynecology

## 2016-10-14 ENCOUNTER — Ambulatory Visit (INDEPENDENT_AMBULATORY_CARE_PROVIDER_SITE_OTHER): Payer: BLUE CROSS/BLUE SHIELD | Admitting: Obstetrics and Gynecology

## 2016-10-14 VITALS — BP 100/60 | HR 88 | Ht 66.0 in | Wt 157.0 lb

## 2016-10-14 DIAGNOSIS — Z30431 Encounter for routine checking of intrauterine contraceptive device: Secondary | ICD-10-CM

## 2016-10-14 DIAGNOSIS — N938 Other specified abnormal uterine and vaginal bleeding: Secondary | ICD-10-CM | POA: Diagnosis not present

## 2016-10-14 LAB — POCT URINE PREGNANCY: Preg Test, Ur: NEGATIVE

## 2016-10-14 NOTE — Progress Notes (Signed)
Chief Complaint  Patient presents with  . aub    HPI:      [name] is a 41 y.o. [id number] who LMP was Patient's last menstrual period was 09/14/2016., presents today for DUB with IUD. Pt's menses started 09/14/16 and lasted 18 days. Flow was med-heavy with quarter and half-dollar sized clots. Bleeding stopped for about 4 days and then restarted and lasted 7 days, again heavier and with clots. She had dysmen. Menses are usually monthly, lasting 6 days, med flow. This is the first time this has happened. Mirena IUD placed 12/12/13. She denies any pelvic pain, dysparuenia.  She has a hx of hypothyroidism but had euthyroid labs last wk. She was found to be mildly anemic. She has recently started on insulin for DM.  She is past due for her annual.    Patient Active Problem List   Diagnosis Date Noted  . Allergic rhinitis 06/03/2015  . Anxiety 06/03/2015  . Acute systolic heart failure ([place]) 06/03/2015  . Clinical depression 06/03/2015  . Diabetes mellitus, type 2 ([place]) 06/03/2015  . Acid reflux 06/03/2015  . History of biliary T-tube placement 06/03/2015  . HD (Hodgkin's disease) ([place]) 06/03/2015  . RAD (reactive airway disease) 06/03/2015  . External hemorrhoid, thrombosed 06/03/2015  . Fast heart beat 06/03/2015  . Chronic systolic heart failure ([place]) 05/23/2014  . 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 Acquired hypothyroidism 04/05/2013    Family History  Problem Relation Age of Onset  . Hypertension Mother     Social History   Social History  . Marital status: Married    Spouse name: N/A  . Number of children: N/A  . Years of education: 47   Occupational History  . [employer]   Social History Main Topics  . Smoking status: Former Smoker    Packs/day: 0.25    Years: 10.00    Types: Cigarettes    Quit date: 03/10/2016  . Smokeless tobacco: Never Used  . Alcohol use Yes     Comment: occasional  . Drug use: No  .  Sexual activity: Yes    Birth control/ protection: IUD   Other Topics Concern  . Not on file   Social History Narrative  . No narrative on file     Current Outpatient Prescriptions:  .  ALPRAZolam (XANAX) 0.5 MG tablet, TAKE 1/2 TO 1 TABLET BY MOUTH AT BEDTIME AS NEEDED FOR PANIC ATTACKS., Disp: 30 tablet, Rfl: 3 .  esomeprazole (NEXIUM) 40 MG capsule, Take 1 capsule (40 mg total) by mouth daily., Disp: 90 capsule, Rfl: 3 .  glucose blood (CONTOUR NEXT TEST) test strip, Use once daily. \, Disp: , Rfl:  .  levothyroxine (SYNTHROID, LEVOTHROID) 100 MCG tablet, TAKE 1 TABLET BY MOUTH ONCE DAILY ON EMPTY STOMACH 30 TO 60 MINUTES BEFORE BREAKFAST, Disp: , Rfl: 2 .  lisinopril (PRINIVIL,ZESTRIL) 2.5 MG tablet, Take by mouth., Disp: , Rfl:  .  metoprolol succinate (TOPROL-XL) 25 MG 24 hr tablet, Take by mouth., Disp: , Rfl:  .  NOVOLOG MIX 70/30 FLEXPEN (70-30) 100 UNIT/ML FlexPen, INJECT 8 UNITS TWICE DAILY BEFORE MEALS, Disp: , Rfl: 6 .  pravastatin (PRAVACHOL) 20 MG tablet, Take by mouth., Disp: , Rfl:  .  sertraline (ZOLOFT) 50 MG tablet, Take 1 tablet (50 mg total) by mouth at bedtime., Disp: 90 tablet, Rfl: 3 .  amoxicillin-clavulanate (AUGMENTIN) 875-125 MG tablet, Take 1 tablet by mouth 2 (two) times daily. (Patient not  taking: Reported on 10/14/2016), Disp: 20 tablet, Rfl: 0 .  butalbital-acetaminophen-caffeine (FIORICET WITH CODEINE) 50-325-40-30 MG capsule, Take 1 capsule by mouth every 4 (four) hours as needed for headache. (Patient not taking: Reported on 10/14/2016), Disp: 14 capsule, Rfl: 0 .  ferrous sulfate 325 (65 FE) MG tablet, TAKE 1 TABLET (325 MG TOTAL) BY MOUTH 2 (TWO) TIMES DAILY WITH MEALS., Disp: , Rfl: 11 .  KOMBIGLYZE XR 2.10-998 MG TB24, TAKE 2 TABLETS BY MOUTH ONCE DAILY. TAKE WITH EVENING MEAL., Disp: , Rfl: 6 .  SUMAtriptan (IMITREX) 50 MG tablet, TAKE 1 TABLET AT TIME OF HEADACHE. IF NOT RESOLVED IN 2 HOURS, MAY REPEAT ONCE. MAX 4 ABS/24 HOURS, Disp: , Rfl:  0  Review of Systems  Constitutional: Negative for fever.  Gastrointestinal: Negative for blood in stool, constipation, diarrhea, nausea and vomiting.  Genitourinary: Positive for vaginal bleeding. Negative for dyspareunia, dysuria, flank pain, frequency, hematuria, urgency, vaginal discharge and vaginal pain.  Musculoskeletal: Negative for back pain.  Skin: Negative for rash.     OBJECTIVE:   Vitals:  BP 100/60   Pulse 88   Ht 5\' 6"  (1.676 m)   Wt 157 lb (71.2 kg)   LMP 09/14/2016   BMI 25.34 kg/m   Physical Exam  Constitutional: She is oriented to person, place, and time and well-developed, well-nourished, and in no distress. Vital signs are normal.  Genitourinary: Vagina normal, uterus normal, cervix normal, right adnexa normal, left adnexa normal and vulva normal. Uterus is not enlarged. Cervix exhibits no motion tenderness and no tenderness. Right adnexum displays no mass and no tenderness. Left adnexum displays no mass and no tenderness. Vulva exhibits no erythema, no exudate, no lesion, no rash and no tenderness. Vagina exhibits no lesion.  Genitourinary Comments: IUD STRINGS NOT VISIBLE/PALPABLE  Neurological: She is oriented to person, place, and time.  Vitals reviewed.   Results: Results for orders placed or performed in visit on 10/14/16 (from the past 24 hour(s))  POCT urine pregnancy     Status: Normal   Collection Time: 10/14/16 12:08 PM  Result Value Ref Range   Preg Test, Ur Negative Negative       Assessment/Plan: DUB (dysfunctional uterine bleeding) - Sx resolved. Neg UPT. IUD strings not visible. Check u/s for IUD placement. Pt will see me after for annual and f/u.  - Plan: POCT urine pregnancy, US Transvaginal Non-OB  Encounter for routine checking of intrauterine contraceptive device (IUD) - Plan: US Transvaginal Non-OB    Return in about 1 week (around 10/21/2016) for GYN u/s for IUD check/annual.  Elmo Putt B. Jeanise Durfey, PA-C 10/14/2016 12:10  PM

## 2016-11-16 DIAGNOSIS — R0602 Shortness of breath: Secondary | ICD-10-CM | POA: Diagnosis not present

## 2016-11-16 DIAGNOSIS — R635 Abnormal weight gain: Secondary | ICD-10-CM | POA: Diagnosis not present

## 2016-11-16 DIAGNOSIS — I427 Cardiomyopathy due to drug and external agent: Secondary | ICD-10-CM | POA: Diagnosis not present

## 2016-11-16 DIAGNOSIS — I5022 Chronic systolic (congestive) heart failure: Secondary | ICD-10-CM | POA: Diagnosis not present

## 2016-11-16 DIAGNOSIS — R931 Abnormal findings on diagnostic imaging of heart and coronary circulation: Secondary | ICD-10-CM | POA: Diagnosis not present

## 2016-11-16 DIAGNOSIS — I340 Nonrheumatic mitral (valve) insufficiency: Secondary | ICD-10-CM | POA: Diagnosis not present

## 2016-11-16 DIAGNOSIS — T451X5A Adverse effect of antineoplastic and immunosuppressive drugs, initial encounter: Secondary | ICD-10-CM | POA: Diagnosis not present

## 2016-11-24 ENCOUNTER — Ambulatory Visit: Payer: BLUE CROSS/BLUE SHIELD | Admitting: Obstetrics and Gynecology

## 2016-11-24 ENCOUNTER — Other Ambulatory Visit: Payer: BLUE CROSS/BLUE SHIELD

## 2016-12-09 ENCOUNTER — Ambulatory Visit (INDEPENDENT_AMBULATORY_CARE_PROVIDER_SITE_OTHER): Payer: BLUE CROSS/BLUE SHIELD | Admitting: Family Medicine

## 2016-12-09 VITALS — BP 108/74 | HR 88 | Temp 97.8°F | Resp 14 | Wt 161.0 lb

## 2016-12-09 DIAGNOSIS — M545 Low back pain, unspecified: Secondary | ICD-10-CM

## 2016-12-09 DIAGNOSIS — K529 Noninfective gastroenteritis and colitis, unspecified: Secondary | ICD-10-CM

## 2016-12-09 LAB — POCT URINALYSIS DIPSTICK
Bilirubin, UA: NEGATIVE
Glucose, UA: NEGATIVE
Ketones, UA: NEGATIVE
Leukocytes, UA: NEGATIVE
Nitrite, UA: NEGATIVE
PROTEIN UA: NEGATIVE
RBC UA: NEGATIVE
UROBILINOGEN UA: 0.2 U/dL
pH, UA: 7 (ref 5.0–8.0)

## 2016-12-09 NOTE — Patient Instructions (Signed)
Discussed taking two Aleve twice daily with food or Tylenol for back pain. Discussed use of heating pad for up to twenty minutes several x day. Add Gatorade while stomach is getting better.

## 2016-12-09 NOTE — Progress Notes (Signed)
Subjective:     Patient ID: Lori [name], female   DOB: 07/27/1975, 41 y.o.   MRN: [id number]  HPI  Chief Complaint  Patient presents with  . Back Pain    Patient started to have right mid/lower back pain since saturday June 30th. Constant ache sensation with sharp pain at times. Pain does not radiate to other locations. She does not recall injuring herself or doing anything extreneous. She also had upset stomach Tuesday night and vomited, had cold sweats. Nausea Wednesday July 4th and just does not feel well. Some urinary pressure is present also. No fever that she knows off. She does not know if all the symptoms she is having are related.  States her fasting glucose was 105 this AM  Feels that her stomach is starting to feel better. States she had a little diarrhea earlier in the week. She has been around and lifting her 20 # grandchild. She is followed by [facility] for DUB and IUD.   Review of Systems     Objective:   Physical Exam  Constitutional: She appears well-developed and well-nourished. No distress.  Pulmonary/Chest: Breath sounds normal.  Abdominal: Soft. There is no tenderness. There is no guarding.  Musculoskeletal:  Muscle strength in lower extremities 5/5. SLR's to 90 degrees without radiation of back pain. Localizes to lateral lower thoracic back area. No tenderness to touch or rash noted.       Assessment:    1. Acute right-sided low back pain without sciatica - POCT urinalysis dipstick  2. Gastroenteritis: resolving    Plan:   Discussed use of Gatorade while ill. May add nsaid's or Tylenol if stomach upset for back pain. Warm compresses for 20 minutes several x day.

## 2016-12-16 ENCOUNTER — Telehealth: Payer: Self-pay | Admitting: Physician Assistant

## 2016-12-16 NOTE — Telephone Encounter (Signed)
Please review-aa 

## 2016-12-16 NOTE — Telephone Encounter (Signed)
Patient advised as below, pain does not radiate down the leg at this time. It is staying in one spot-aa

## 2016-12-16 NOTE — Telephone Encounter (Signed)
Pt husband called stating pt is still having lower back pain.  He is asking if pt can take other than over the counter pain medication.  [facility] is the pharmacy.  IF#[phone]/[id number]

## 2016-12-16 NOTE — Telephone Encounter (Signed)
May take Tylenol extra strength up to 3000 mg/day along with two Aleve twice daily with food. If back pain is getting worse or radiating down her leg I need to see her again.

## 2016-12-21 ENCOUNTER — Encounter: Payer: Self-pay | Admitting: Dietician

## 2016-12-21 ENCOUNTER — Encounter: Payer: BLUE CROSS/BLUE SHIELD | Attending: Endocrinology | Admitting: Dietician

## 2016-12-21 VITALS — BP 106/78 | Ht 66.0 in | Wt 160.3 lb

## 2016-12-21 DIAGNOSIS — E109 Type 1 diabetes mellitus without complications: Secondary | ICD-10-CM | POA: Diagnosis not present

## 2016-12-21 DIAGNOSIS — Z713 Dietary counseling and surveillance: Secondary | ICD-10-CM | POA: Diagnosis not present

## 2016-12-21 NOTE — Patient Instructions (Addendum)
Check blood sugars 2 x day before breakfast and before supper and some 2 hr after supper every day  Bring blood sugar records to the next appointment/class  Exercise:  Walk 15 min 2 times/day 5 days a week  Eat 3 meals day,   2  snacks a day in afternoon and at bedtime  Space meals 4-5 hours apart  Avoid sugar sweetened drinks (soda, tea, coffee, sports drinks, juices)  Eat 2-3 carbohydrate servings/meal + protein  Eat 1 carbohydrate serving/snack + protein  Complete 3 Day Food Record and bring to next appt  Get a Sharps container  Carry fast acting glucose and a snack at all times  Rotate injection sites  [place] alert ID at all times  Return for appointment/classes on: 01-05-17 at 1:15p

## 2016-12-21 NOTE — Progress Notes (Signed)
Diabetes Self-Management Education  Visit Type: First/Initial  Appt. Start Time: 1130  Appt. End Time: 1610  12/21/2016  Ms. [facility], identified by name and date of birth, is a 41 y.o. female with a diagnosis of Diabetes:  (? type 1).  Pt reports having diabetes for 23 years after having GDM and now MD thinks she has evolved into Type 1. MD referral is marked type 1  ASSESSMENT  Blood pressure 106/78, height 5\' 6"  (1.676 m), weight 160 lb 4.8 oz (72.7 kg). Body mass index is 25.87 kg/m.      Diabetes Self-Management Education - 12/21/16 1258      Visit Information   Visit Type First/Initial     Initial Visit   Diabetes Type --  ? type 1     Health Coping   How would you rate your overall health? Fair     Psychosocial Assessment   Patient Belief/Attitude about Diabetes Motivated to manage diabetes   Self-care barriers None   Self-management support Doctor's office;Family   Patient Concerns Glycemic Control;Weight Control;Healthy Lifestyle;Medication  prevent complications   Special Needs None   Preferred Learning Style Auditory   Learning Readiness Ready   What is the last grade level you completed in school? 12     Pre-Education Assessment   Patient understands the diabetes disease and treatment process. Needs Review   Patient understands incorporating nutritional management into lifestyle. Needs Review   Patient undertands incorporating physical activity into lifestyle. Needs Review   Patient understands using medications safely. Needs Review   Patient understands monitoring blood glucose, interpreting and using results Needs Review   Patient understands prevention, detection, and treatment of acute complications. Needs Review   Patient understands prevention, detection, and treatment of chronic complications. Needs Review   Patient understands how to develop strategies to address psychosocial issues. Needs Review   Patient understands how to develop strategies  to promote health/change behavior. Needs Review     Complications   Last HgB A1C per patient/outside source 9 %  10-07-16   How often do you check your blood sugar? 1-2 times/day   Fasting Blood glucose range (mg/dL) 130-179   Have you had a dilated eye exam in the past 12 months? Yes  06-2016   Have you had a dental exam in the past 12 months? Yes  07-2016   Are you checking your feet? Yes   How many days per week are you checking your feet? 1     Dietary Intake   Breakfast --  eats at 9a=granola bar or english muffin with cheese or peanut butter   Snack (morning) --  occasional snack at 11a=carrots and celery with hummus or nabs or yogurt or nuts   Lunch --  eats at 12:30p; eats fried foods 2-3x/wk   Snack (afternoon) --  none   Dinner --  eats at 6p-6:30p   Snack (evening) --  none   Beverage(s) --  drinks water 2-5x/day and diet drinks 2-3x/day     Exercise   Exercise Type ADL's     Patient Education   Previous Diabetes Education Yes (please comment)   Disease state  Definition of diabetes, type 1 and 2, and the diagnosis of diabetes;Explored patient's options for treatment of their diabetes;Factors that contribute to the development of diabetes   Nutrition management  Role of diet in the treatment of diabetes and the relationship between the three main macronutrients and blood glucose level;Food label reading, portion sizes and measuring food.;Carbohydrate  counting   Physical activity and exercise  Role of exercise on diabetes management, blood pressure control and cardiac health.;Helped patient identify appropriate exercises in relation to his/her diabetes, diabetes complications and other health issue.   Medications Taught/reviewed insulin injection, site rotation, insulin storage and needle disposal.;Reviewed patients medication for diabetes, action, purpose, timing of dose and side effects.   Monitoring Purpose and frequency of SMBG.;Identified appropriate SMBG and/or A1C  goals.;Yearly dilated eye exam;Taught/discussed recording of test results and interpretation of SMBG.   Acute complications Taught treatment of hypoglycemia - the 15 rule.  gave pt medical alert ID card and 1 tube glucose tablets for PRN use   Chronic complications Relationship between chronic complications and blood glucose control;Dental care;Retinopathy and reason for yearly dilated eye exams;Reviewed with patient heart disease, higher risk of, and prevention  pt has CHF from chemo treatments for Hodgkins in past   Personal strategies to promote health Lifestyle issues that need to be addressed for better diabetes care;Helped patient develop diabetes management plan for (enter comment)     Outcomes   Expected Outcomes Demonstrated interest in learning. Expect positive outcomes      Individualized Plan for Diabetes Self-Management Training:   Learning Objective:  Patient will have a greater understanding of diabetes self-management. Patient education plan is to attend individual and/or group sessions per assessed needs and concerns.   Plan:   Patient Instructions  Check blood sugars 2 x day before breakfast and before supper and some 2 hr after supper every day  Bring blood sugar records to the next appointment/class  Exercise:  Walk 15 min 2 times/day 5 days a week  Eat 3 meals day,   2  snacks a day in afternoon and at bedtime  Space meals 4-5 hours apart  Avoid sugar sweetened drinks (soda, tea, coffee, sports drinks, juices)  Eat 2-3 carbohydrate servings/meal + protein  Eat 1 carbohydrate serving/snack + protein  Complete 3 Day Food Record and bring to next appt  Get a Sharps container  Carry fast acting glucose and a snack at all times  Rotate injection sites  [place] alert ID at all times  Return for appointment/classes on: 01-05-17 at 1:15p   Expected Outcomes:  Demonstrated interest in learning. Expect positive outcomes  Education material provided:  General meal planning guidelines, Living Well With Diabetes booklet, Type 1 Diabetes 101 booklet, low BG handout, 1 tube glucose tablets, medical alert ID card and coupon  If problems or questions, patient to contact team via:  [phone]  Future DSME appointment:  01-05-17

## 2016-12-29 ENCOUNTER — Ambulatory Visit: Payer: BLUE CROSS/BLUE SHIELD | Admitting: Obstetrics and Gynecology

## 2016-12-29 ENCOUNTER — Other Ambulatory Visit: Payer: BLUE CROSS/BLUE SHIELD

## 2017-01-05 ENCOUNTER — Ambulatory Visit: Payer: BLUE CROSS/BLUE SHIELD | Admitting: Dietician

## 2017-01-06 ENCOUNTER — Ambulatory Visit: Payer: BLUE CROSS/BLUE SHIELD | Admitting: Obstetrics and Gynecology

## 2017-01-06 ENCOUNTER — Ambulatory Visit: Payer: BLUE CROSS/BLUE SHIELD

## 2017-01-17 ENCOUNTER — Other Ambulatory Visit: Payer: Self-pay | Admitting: Obstetrics and Gynecology

## 2017-01-17 DIAGNOSIS — N938 Other specified abnormal uterine and vaginal bleeding: Secondary | ICD-10-CM

## 2017-01-18 ENCOUNTER — Other Ambulatory Visit: Payer: BLUE CROSS/BLUE SHIELD

## 2017-01-18 ENCOUNTER — Ambulatory Visit (INDEPENDENT_AMBULATORY_CARE_PROVIDER_SITE_OTHER): Payer: BLUE CROSS/BLUE SHIELD

## 2017-01-18 ENCOUNTER — Encounter: Payer: Self-pay | Admitting: Obstetrics and Gynecology

## 2017-01-18 ENCOUNTER — Other Ambulatory Visit: Payer: Self-pay | Admitting: Obstetrics and Gynecology

## 2017-01-18 ENCOUNTER — Ambulatory Visit (INDEPENDENT_AMBULATORY_CARE_PROVIDER_SITE_OTHER): Payer: BLUE CROSS/BLUE SHIELD | Admitting: Obstetrics and Gynecology

## 2017-01-18 VITALS — BP 110/70 | HR 85 | Ht 66.0 in | Wt 161.0 lb

## 2017-01-18 DIAGNOSIS — R8290 Unspecified abnormal findings in urine: Secondary | ICD-10-CM | POA: Diagnosis not present

## 2017-01-18 DIAGNOSIS — Z30431 Encounter for routine checking of intrauterine contraceptive device: Secondary | ICD-10-CM

## 2017-01-18 DIAGNOSIS — B9689 Other specified bacterial agents as the cause of diseases classified elsewhere: Secondary | ICD-10-CM | POA: Diagnosis not present

## 2017-01-18 DIAGNOSIS — Z01419 Encounter for gynecological examination (general) (routine) without abnormal findings: Secondary | ICD-10-CM | POA: Diagnosis not present

## 2017-01-18 DIAGNOSIS — N938 Other specified abnormal uterine and vaginal bleeding: Secondary | ICD-10-CM

## 2017-01-18 DIAGNOSIS — Z1151 Encounter for screening for human papillomavirus (HPV): Secondary | ICD-10-CM | POA: Diagnosis not present

## 2017-01-18 DIAGNOSIS — N9489 Other specified conditions associated with female genital organs and menstrual cycle: Secondary | ICD-10-CM

## 2017-01-18 DIAGNOSIS — N760 Acute vaginitis: Secondary | ICD-10-CM

## 2017-01-18 DIAGNOSIS — Z124 Encounter for screening for malignant neoplasm of cervix: Secondary | ICD-10-CM

## 2017-01-18 DIAGNOSIS — Z1231 Encounter for screening mammogram for malignant neoplasm of breast: Secondary | ICD-10-CM | POA: Diagnosis not present

## 2017-01-18 DIAGNOSIS — Z1239 Encounter for other screening for malignant neoplasm of breast: Secondary | ICD-10-CM

## 2017-01-18 LAB — POCT URINALYSIS DIPSTICK
Bilirubin, UA: NEGATIVE
GLUCOSE UA: NEGATIVE
Ketones, UA: NEGATIVE
Leukocytes, UA: NEGATIVE
Nitrite, UA: NEGATIVE
RBC UA: NEGATIVE
SPEC GRAV UA: 1.01 (ref 1.010–1.025)
pH, UA: 5 (ref 5.0–8.0)

## 2017-01-18 LAB — POCT WET PREP WITH KOH
CLUE CELLS WET PREP PER HPF POC: POSITIVE
KOH Prep POC: POSITIVE — AB
TRICHOMONAS UA: NEGATIVE
Yeast Wet Prep HPF POC: NEGATIVE

## 2017-01-18 MED ORDER — METRONIDAZOLE 500 MG PO TABS: ORAL_TABLET | ORAL | 0 refills | Status: DC

## 2017-01-18 NOTE — Progress Notes (Signed)
Chief Complaint  Patient presents with  . Gynecologic Exam     HPI:      [name] is a 41 y.o. [id number] who LMP was Patient's last menstrual period was 01/03/2017., presents today for her annual examination.  Her menses are regular every 28-30 days, lasting 6 days, med flow with small clots.  Dysmenorrhea mild. She has not had intermenstrual bleeding since 4/18. She was seen 5/18 for DUB sx with an 18 day heavy period and then bleeding again 7 days later. IUD strings not visible at that time. Pt sched for u/s 1 wk later with annual. Pt had u/s today.   Sex activity: single partner, contraception - IUD. Mirena placed 01/01/14 (IUD strings not visible 5/18) Last Pap: Oct 08, 2013  Results were: no abnormalities /neg HPV DNA  Hx of STDs: none  She complains of increased vaginal d/c and odor without irritation recently. She has also had an odor to her urine but had a neg urine check with her PCP a few wks ago. She started taking MVIs recently and wonders if related to sx. No recent abx use.   Last mammogram:pt gets MRIs at [facility] yearly for hx of  Breast mass/bx. There is no FH of breast cancer. There is no FH of ovarian cancer. The patient does do self-breast exams.  Tobacco use: The patient denies current or previous tobacco use. Alcohol use: none Exercise: not active  She does get adequate calcium and Vitamin D in her diet.  She has labs with PCP.  Past Medical History:  Diagnosis Date  . Bacterial vaginitis   . CHF (congestive heart failure) ([facility])   . Depression   . Diabetes mellitus without complication ([place])   . History of abnormal mammogram 2013   @[facility] - NEG MRI AND BX OCT 2014 NORMAL  . History of mammogram 03/2015   BREAST MRI AT [facility] WNL  . History of Papanicolaou smear of cervix 10/08/2013; 08/04/15   -/-; ASCUS/HPV NEG;  . Hodgkin's disease ([place]) 1987   She had surgical resection of Lymph nodes and chemo + rad tx's.  Marland Kitchen Hypertension   . Hypothyroidism    2ND TO  [place]    Past Surgical History:  Procedure Laterality Date  . INTRAUTERINE DEVICE (IUD) INSERTION  [id number]; 02/06/09; 12/12/13  . SPLENECTOMY    . THYROIDECTOMY      Family History  Problem Relation Age of Onset  . Hypertension Mother     Social History   Social History  . Marital status: Married    Spouse name: N/A  . Number of children: N/A  . Years of education: 49   Occupational History  . QUOTATION [employer]   Social History Main Topics  . Smoking status: Former Smoker    Packs/day: 0.25    Years: 10.00    Types: Cigarettes    Quit date: 03/10/2016  . Smokeless tobacco: Never Used  . Alcohol use 1.2 oz/week    2 Glasses of wine per week     Comment: occasional  . Drug use: No  . Sexual activity: Yes    Birth control/ protection: IUD   Other Topics Concern  . Not on file   Social History Narrative  . No narrative on file     Current Outpatient Prescriptions:  .  metoprolol succinate (TOPROL-XL) 25 MG 24 hr tablet, Take by mouth., Disp: , Rfl:  .  ALPRAZolam (XANAX) 0.5 MG tablet, TAKE 1/2 TO 1  TABLET BY MOUTH AT BEDTIME AS NEEDED FOR PANIC ATTACKS. (Patient not taking: Reported on 12/21/2016), Disp: 30 tablet, Rfl: 3 .  esomeprazole (NEXIUM) 40 MG capsule, Take 1 capsule (40 mg total) by mouth daily., Disp: 90 capsule, Rfl: 3 .  ferrous sulfate 325 (65 FE) MG tablet, TAKE 1 TABLET (325 MG TOTAL) BY MOUTH 2 (TWO) TIMES DAILY WITH MEALS., Disp: , Rfl: 11 .  glucose blood (CONTOUR NEXT TEST) test strip, Use once daily. \, Disp: , Rfl:  .  ibuprofen (ADVIL,MOTRIN) 200 MG tablet, Take 400 mg by mouth as needed., Disp: , Rfl:  .  KOMBIGLYZE XR 2.10-998 MG TB24, TAKE 1 TABLETS BY MOUTH ONCE DAILY. TAKE WITH EVENING MEAL., Disp: , Rfl: 6 .  levothyroxine (SYNTHROID, LEVOTHROID) 100 MCG tablet, TAKE 1 TABLET BY MOUTH ONCE DAILY ON EMPTY STOMACH 30 TO 60 MINUTES BEFORE BREAKFAST, Disp: , Rfl: 2 .  levothyroxine (SYNTHROID, LEVOTHROID) 112 MCG tablet, TAKE  1 TABLET EVERY DAY ON AN EMPTY STOMACH WITH A GLASS OF WATER AT LEAST 30-60 MINS BEFORE BFAST, Disp: , Rfl: 3 .  lisinopril (PRINIVIL,ZESTRIL) 2.5 MG tablet, Take 2.5 mg by mouth daily. , Disp: , Rfl:  .  metoprolol succinate (TOPROL-XL) 25 MG 24 hr tablet, Take 25 mg by mouth daily. , Disp: , Rfl:  .  metroNIDAZOLE (FLAGYL) 500 MG tablet, Take 2 tabs BID for 1 day, Disp: 4 tablet, Rfl: 0 .  NOVOLOG MIX 70/30 FLEXPEN (70-30) 100 UNIT/ML FlexPen, INJECT 55 UNITS in the morning and 50 units in the evening, Disp: , Rfl: 6 .  pravastatin (PRAVACHOL) 20 MG tablet, Take 20 mg by mouth daily. , Disp: , Rfl:  .  sertraline (ZOLOFT) 50 MG tablet, Take 1 tablet (50 mg total) by mouth at bedtime., Disp: 90 tablet, Rfl: 3  ROS:  Review of Systems  Constitutional: Negative for fatigue, fever and unexpected weight change.  Respiratory: Negative for cough, shortness of breath and wheezing.   Cardiovascular: Negative for chest pain, palpitations and leg swelling.  Gastrointestinal: Negative for blood in stool, constipation, diarrhea, nausea and vomiting.  Endocrine: Negative for cold intolerance, heat intolerance and polyuria.  Genitourinary: Positive for frequency, menstrual problem and vaginal bleeding. Negative for dyspareunia, dysuria, flank pain, genital sores, hematuria, pelvic pain, urgency, vaginal discharge and vaginal pain.  Musculoskeletal: Negative for back pain, joint swelling and myalgias.  Skin: Negative for rash.  Neurological: Negative for dizziness, syncope, light-headedness, numbness and headaches.  Hematological: Negative for adenopathy.  Psychiatric/Behavioral: Negative for agitation, confusion, sleep disturbance and suicidal ideas. The patient is not nervous/anxious.      Objective: BP 110/70   Pulse 85   Ht 5\' 6"  (1.676 m)   Wt 161 lb (73 kg)   LMP 01/03/2017   BMI 25.99 kg/m    Physical Exam  Constitutional: She is oriented to person, place, and time. She appears  well-developed and well-nourished.  Genitourinary: Vagina normal and uterus normal. There is no rash or tenderness on the right labia. There is no rash or tenderness on the left labia. No erythema or tenderness in the vagina. No vaginal discharge found. Right adnexum does not display mass and does not display tenderness. Left adnexum does not display mass and does not display tenderness. Cervix does not exhibit motion tenderness or polyp. Uterus is not enlarged or tender.  Neck: Normal range of motion. No thyromegaly present.  Cardiovascular: Normal rate, regular rhythm and normal heart sounds.   No murmur heard. Pulmonary/Chest: Effort normal  and breath sounds normal. Right breast exhibits no mass, no nipple discharge, no skin change and no tenderness. Left breast exhibits no mass, no nipple discharge, no skin change and no tenderness.  Abdominal: Soft. There is no tenderness. There is no guarding.  Musculoskeletal: Normal range of motion.  Neurological: She is alert and oriented to person, place, and time. No cranial nerve deficit.  Psychiatric: She has a normal mood and affect. Her behavior is normal.  Vitals reviewed.   Results: Results for orders placed or performed in visit on 01/18/17 (from the past 24 hour(s))  POCT Wet Prep with KOH     Status: Abnormal   Collection Time: 01/18/17  6:11 PM  Result Value Ref Range   Trichomonas, UA Negative    Clue Cells Wet Prep HPF POC pos    Epithelial Wet Prep HPF POC  Few, Moderate, Many, Too numerous to count   Yeast Wet Prep HPF POC neg    Bacteria Wet Prep HPF POC  Few   RBC Wet Prep HPF POC     WBC Wet Prep HPF POC     KOH Prep POC Positive (A) Negative  POCT Urinalysis Dipstick     Status: Normal   Collection Time: 01/18/17  6:11 PM  Result Value Ref Range   Color, UA yellow    Clarity, UA     Glucose, UA neg    Bilirubin, UA neg    Ketones, UA neg    Spec Grav, UA 1.010 1.010 - 1.025   Blood, UA neg    pH, UA 5.0 5.0 - 8.0    Protein, UA trace    Urobilinogen, UA  0.2 or 1.0 E.U./dL   Nitrite, UA neg    Leukocytes, UA Negative Negative   GYN U/S-->EM=2.59 MM; NO FF IN CDS; IUD NOT IN UTERINE CAVITY; BILAT OVAR WNL; EM ECHOGENIC VASCULAR MASS, QUESTION POLYP, 2.86 X 1.2 CM   Assessment/Plan: Encounter for annual routine gynecological examination  Cervical cancer screening - Plan: IGP, Aptima HPV  Screening for HPV (human papillomavirus) - Plan: IGP, Aptima HPV  Encounter for routine checking of intrauterine contraceptive device (IUD) - IUD not in place on u/s. Pt would like another one once EM mass eval.  Screening for breast cancer - Pt [facility] MR[facility].  Bacterial vaginosis - Rx flagyl. F/u prn. - Plan: metroNIDAZOLE (FLAGYL) 500 MG tablet, POCT Wet Prep with KOH  Urine malodor - Neg dip. Question BV odor vs MVI use. F/u prn. - Plan: POCT Urinalysis Dipstick  Endometrial mass - On u/s. Glean Salen with MD for further eval/mgmt.   DUB (dysfunctional uterine bleeding)             GYN counsel adequate intake of calcium and vitamin D     F/U  Return for with MD for [facility].  Delman Goshorn B. Adlee Paar, PA-C 01/18/2017 6:21 PM

## 2017-01-20 ENCOUNTER — Encounter: Payer: Self-pay | Admitting: Dietician

## 2017-01-20 LAB — IGP, APTIMA HPV
HPV Aptima: NEGATIVE
PAP SMEAR COMMENT: 0

## 2017-01-20 NOTE — Progress Notes (Signed)
Patient has not responded to phone call (left by office manager) to reschedule. Sent discharge letter to MD.

## 2017-01-24 ENCOUNTER — Telehealth: Payer: Self-pay | Admitting: Obstetrics and Gynecology

## 2017-01-24 NOTE — Telephone Encounter (Signed)
To you. Thx.

## 2017-01-24 NOTE — Telephone Encounter (Signed)
Pt is calling to find out when she will be schedule for an SHGM. Please advise

## 2017-02-01 DIAGNOSIS — E119 Type 2 diabetes mellitus without complications: Secondary | ICD-10-CM | POA: Diagnosis not present

## 2017-02-01 DIAGNOSIS — I5022 Chronic systolic (congestive) heart failure: Secondary | ICD-10-CM | POA: Diagnosis not present

## 2017-02-01 DIAGNOSIS — Z794 Long term (current) use of insulin: Secondary | ICD-10-CM | POA: Diagnosis not present

## 2017-02-01 DIAGNOSIS — T451X5A Adverse effect of antineoplastic and immunosuppressive drugs, initial encounter: Secondary | ICD-10-CM | POA: Diagnosis not present

## 2017-02-01 DIAGNOSIS — I427 Cardiomyopathy due to drug and external agent: Secondary | ICD-10-CM | POA: Diagnosis not present

## 2017-02-08 ENCOUNTER — Other Ambulatory Visit: Payer: Self-pay | Admitting: Obstetrics and Gynecology

## 2017-02-08 DIAGNOSIS — N939 Abnormal uterine and vaginal bleeding, unspecified: Secondary | ICD-10-CM

## 2017-02-09 ENCOUNTER — Ambulatory Visit (INDEPENDENT_AMBULATORY_CARE_PROVIDER_SITE_OTHER): Payer: BLUE CROSS/BLUE SHIELD

## 2017-02-09 ENCOUNTER — Ambulatory Visit (INDEPENDENT_AMBULATORY_CARE_PROVIDER_SITE_OTHER): Payer: BLUE CROSS/BLUE SHIELD | Admitting: Obstetrics and Gynecology

## 2017-02-09 DIAGNOSIS — T839XXA Unspecified complication of genitourinary prosthetic device, implant and graft, initial encounter: Secondary | ICD-10-CM

## 2017-02-09 DIAGNOSIS — N938 Other specified abnormal uterine and vaginal bleeding: Secondary | ICD-10-CM | POA: Diagnosis not present

## 2017-02-09 DIAGNOSIS — N939 Abnormal uterine and vaginal bleeding, unspecified: Secondary | ICD-10-CM | POA: Diagnosis not present

## 2017-02-09 DIAGNOSIS — N9489 Other specified conditions associated with female genital organs and menstrual cycle: Secondary | ICD-10-CM

## 2017-02-09 NOTE — Progress Notes (Signed)
Gynecology Ultrasound Follow Up  Chief Complaint: heavy vaginal bleeding   History of Present Illness: Patient is a 41 y.o. female who presents today for ultrasound evaluation of heavy vaginal bleeding.  Ultrasound demonstrates the following findings Adnexa: normal Uterus: anteverted with 2.5 x 1.3 cm intrauterine lesion noted.  Additional: no IUD noted in the uterine cavity.  The patient does not recall ever having her IUD come out. It was placed in 2015 and the strings should be seen since that time.  Past Medical History:  Diagnosis Date  . Bacterial vaginitis   . CHF (congestive heart failure) ([place])   . Depression   . Diabetes mellitus without complication ([place])   . History of abnormal mammogram 2013   @[facility] - NEG MRI AND BX OCT 2014 NORMAL  . History of mammogram 03/2015   BREAST MRI AT [facility] WNL  . History of Papanicolaou smear of cervix 10/08/2013; 08/04/15   -/-; ASCUS/HPV NEG;  . Hodgkin's disease ([place]) 1987   She had surgical resection of Lymph nodes and chemo + rad tx's.  Marland Kitchen Hypertension   . Hypothyroidism    2ND TO [place]    Past Surgical History:  Procedure Laterality Date  . INTRAUTERINE DEVICE (IUD) INSERTION  [id number]; 02/06/09; 12/12/13  . SPLENECTOMY    . THYROIDECTOMY      Family History  Problem Relation Age of Onset  . Hypertension Mother     Social History   Social History  . Marital status: Married    Spouse name: N/A  . Number of children: N/A  . Years of education: 37   Occupational History  . QUOTATION [employer]   Social History Main Topics  . Smoking status: Former Smoker    Packs/day: 0.25    Years: 10.00    Types: Cigarettes    Quit date: 03/10/2016  . Smokeless tobacco: Never Used  . Alcohol use 1.2 oz/week    2 Glasses of wine per week     Comment: occasional  . Drug use: No  . Sexual activity: Yes    Birth control/ protection: IUD   Other Topics Concern  . Not on file   Social History Narrative  . No  narrative on file    Allergies  Allergen Reactions  . Dapagliflozin Other (See Comments)    UTI  . Liraglutide Nausea Only    Prior to Admission medications   Medication Sig Start Date End Date Taking? Authorizing Provider  esomeprazole (NEXIUM) 40 MG capsule Take 1 capsule (40 mg total) by mouth daily. 04/15/16   Mar Daring, PA-C  ferrous sulfate 325 (65 FE) MG tablet TAKE 1 TABLET (325 MG TOTAL) BY MOUTH 2 (TWO) TIMES DAILY WITH MEALS. 10/07/16   [provider]  glucose blood (CONTOUR NEXT TEST) test strip Use once daily. \ 03/09/16 03/09/17  [provider]  ibuprofen (ADVIL,MOTRIN) 200 MG tablet Take 400 mg by mouth as needed.    [provider]  KOMBIGLYZE XR 2.10-998 MG TB24 TAKE 1 TABLETS BY MOUTH ONCE DAILY. TAKE WITH EVENING MEAL. 10/09/16   [provider]  levothyroxine (SYNTHROID, LEVOTHROID) 100 MCG tablet TAKE 1 TABLET BY MOUTH ONCE DAILY ON EMPTY STOMACH 30 TO 60 MINUTES BEFORE BREAKFAST 05/25/15   [provider]  levothyroxine (SYNTHROID, LEVOTHROID) 112 MCG tablet TAKE 1 TABLET EVERY DAY ON AN EMPTY STOMACH WITH A GLASS OF WATER AT LEAST 30-60 MINS BEFORE BFAST 12/23/16   [provider]  lisinopril (PRINIVIL,ZESTRIL) 2.5  MG tablet Take 2.5 mg by mouth daily.     [provider]  metoprolol succinate (TOPROL-XL) 25 MG 24 hr tablet Take 25 mg by mouth daily.     [provider]  metoprolol succinate (TOPROL-XL) 25 MG 24 hr tablet Take by mouth. 01/17/17   [provider]  metroNIDAZOLE (FLAGYL) 500 MG tablet Take 2 tabs BID for 1 day 09/22/38   Copland, Alicia B, PA-C  NOVOLOG MIX 70/30 FLEXPEN (70-30) 100 UNIT/ML FlexPen INJECT 55 UNITS in the morning and 50 units in the evening 09/01/16   [provider]  pravastatin (PRAVACHOL) 20 MG tablet Take 20 mg by mouth daily.     [provider]  sertraline (ZOLOFT) 50 MG tablet Take 1 tablet (50 mg total) by mouth at bedtime.  04/15/16   Mar Daring, PA-C    Physical Exam There were no vitals taken for this visit.   General: NAD HEENT: normocephalic, anicteric Pulmonary: No increased work of breathing Extremities: no edema, erythema, or tenderness Neurologic: Grossly intact, normal gait Psychiatric: mood appropriate, affect full   Assessment: 41 y.o. [id number] with menorrhagia with irregular cycle and IUD mechanical complication.   Plan: Problem List Items Addressed This Visit    Endometrial mass - Primary   DUB (dysfunctional uterine bleeding)    Other Visit Diagnoses    Complication of intrauterine device (IUD), unspecified complication, initial encounter ([place])       Relevant Orders   DG Abd 1 View     For menorrhagia (DUB, AUB), likely due to endometrial polyp. Will schedule for hysteroscopy, dilation and curettage with polyp removal.  IUD: will get KUB to verify the IUD is not located inside the abdominal cavity.   15 minutes spent in face to face discussion with > 50% spent in counseling and management of her menorrhagia and her IUD complication.   Prentice Docker, MD 02/09/2017 5:08 PM

## 2017-02-09 NOTE — Progress Notes (Signed)
   Sonohysterogram Procedure Note  The indications for this procedure were reviewed with the patient. The procedure was explained in detail and all questions were answered.  The patient was placed in the lithotomy position. A graves speculum was introduced into the vagina and the cervix was visualized. The cervix was prepped with iodine solution. A Cook's Hysterography catheter was then introduced into the uterine cavity and the speculum was removed.   Sterile sonohysterography with 3D Reconstruction was performed. The endometrial cavity was distended with sterile saline. The findings are as follows: a 2.5 x 1.3 cm intrauterine lesion with a regular shape and apparent blood flow on doppler interrogation.    The patient tolerated the procedure well without complication.  Prentice Docker, MD 02/09/2017 5:02 PM

## 2017-02-10 ENCOUNTER — Telehealth: Payer: Self-pay | Admitting: Obstetrics and Gynecology

## 2017-02-10 ENCOUNTER — Ambulatory Visit
Admission: RE | Admit: 2017-02-10 | Discharge: 2017-02-10 | Disposition: A | Discharge status: Home / Self Care | Payer: BLUE CROSS/BLUE SHIELD | Source: Ambulatory Visit | Attending: Obstetrics and Gynecology | Admitting: Obstetrics and Gynecology

## 2017-02-10 DIAGNOSIS — T839XXA Unspecified complication of genitourinary prosthetic device, implant and graft, initial encounter: Secondary | ICD-10-CM | POA: Insufficient documentation

## 2017-02-10 DIAGNOSIS — R109 Unspecified abdominal pain: Secondary | ICD-10-CM | POA: Diagnosis not present

## 2017-02-10 DIAGNOSIS — Y831 Surgical operation with implant of artificial internal device as the cause of abnormal reaction of the patient, or of later complication, without mention of misadventure at the time of the procedure: Secondary | ICD-10-CM | POA: Diagnosis not present

## 2017-02-10 NOTE — Telephone Encounter (Signed)
Pt is schedule 02/10/17 at 11:00

## 2017-02-10 NOTE — Telephone Encounter (Signed)
-----   Message from Brien Few, [place] sent at 02/10/2017  8:37 AM EDT ----- Regarding: FW: pregnancy test Lori [name], please call pt and schedule her an appt for Nurse visit for pregnancy test please. Thank you. letme know once done. ----- Message ----- From: Will Bonnet, MD Sent: 02/09/2017   5:10 PM To: Brien Few, CMA Subject: pregnancy test                                 We need to verify that this patient is not pregnant. I don't have a pregnancy test on any recent documentation. Would you mind calling her and having her come by to have one done?  Sooner is better than later.  Thanks!

## 2017-02-11 ENCOUNTER — Other Ambulatory Visit: Payer: Self-pay | Admitting: Obstetrics and Gynecology

## 2017-02-11 ENCOUNTER — Telehealth: Payer: Self-pay | Admitting: Obstetrics and Gynecology

## 2017-02-11 ENCOUNTER — Other Ambulatory Visit: Payer: Self-pay

## 2017-02-11 ENCOUNTER — Ambulatory Visit (INDEPENDENT_AMBULATORY_CARE_PROVIDER_SITE_OTHER): Payer: BLUE CROSS/BLUE SHIELD

## 2017-02-11 DIAGNOSIS — K219 Gastro-esophageal reflux disease without esophagitis: Secondary | ICD-10-CM

## 2017-02-11 DIAGNOSIS — T8332XA Displacement of intrauterine contraceptive device, initial encounter: Secondary | ICD-10-CM

## 2017-02-11 DIAGNOSIS — Z3202 Encounter for pregnancy test, result negative: Secondary | ICD-10-CM | POA: Diagnosis not present

## 2017-02-11 DIAGNOSIS — B9689 Other specified bacterial agents as the cause of diseases classified elsewhere: Secondary | ICD-10-CM

## 2017-02-11 DIAGNOSIS — N760 Acute vaginitis: Secondary | ICD-10-CM

## 2017-02-11 LAB — POCT URINE PREGNANCY: PREG TEST UR: NEGATIVE

## 2017-02-11 MED ORDER — ESOMEPRAZOLE MAGNESIUM 40 MG PO CPDR: 40.0000 mg | DELAYED_RELEASE_CAPSULE | Freq: Every day | ORAL | 3 refills | Status: DC

## 2017-02-11 NOTE — Telephone Encounter (Signed)
Discussed that on X-Ray no IUD was seen.  Plan for surgery with hysteroscopy, D&C, polypectomy. Needs cardiac clearance first. She will obtain and then I will schedule surgery.   She voiced understanding and agreement.

## 2017-02-16 NOTE — Telephone Encounter (Signed)
-----   Message from Will Bonnet, MD sent at 02/16/2017  2:27 PM EDT ----- Regarding: Schedule surgery Surgery Booking Request Patient Full Name:   MRN: [id number]  DOB: 03-17-1976  Surgeon: Prentice Docker, MD  Requested Surgery Date and Time: soon Primary Diagnosis AND Code: endometrial mass, dysfunctional uterine bleeding Secondary Diagnosis and Code:  Surgical Procedure: hysteroscopy, dilation and curettage, polypectomy, placement of mirena IUD L&D Notification: No Admission Status: same day surgery Length of Surgery: 45 minutes Special Case Needs: MyoSure device H&P: TBD (date) Phone Interview???: no Interpreter: Language:  Medical Clearance: needs cardiology clearance Special Scheduling Instructions: Please have Myosure for case. Also need to have a Mirena from clinic for this patient to take to the OR for placement.

## 2017-02-16 NOTE — Telephone Encounter (Signed)
Patient is aware of H&P on 03/02/17 2 9:45am at [facility] w/ Dr. Glennon Mac, Pre-admit Testing following, and OR on 03/10/17.

## 2017-03-02 ENCOUNTER — Ambulatory Visit (INDEPENDENT_AMBULATORY_CARE_PROVIDER_SITE_OTHER): Payer: BLUE CROSS/BLUE SHIELD | Admitting: Obstetrics and Gynecology

## 2017-03-02 ENCOUNTER — Encounter
Admission: RE | Admit: 2017-03-02 | Discharge: 2017-03-02 | Disposition: A | Discharge status: Home / Self Care | Payer: BLUE CROSS/BLUE SHIELD | Source: Ambulatory Visit | Attending: Obstetrics and Gynecology | Admitting: Obstetrics and Gynecology

## 2017-03-02 VITALS — BP 118/76 | Ht 66.0 in | Wt 162.0 lb

## 2017-03-02 DIAGNOSIS — Z01810 Encounter for preprocedural cardiovascular examination: Secondary | ICD-10-CM | POA: Insufficient documentation

## 2017-03-02 DIAGNOSIS — N9489 Other specified conditions associated with female genital organs and menstrual cycle: Secondary | ICD-10-CM | POA: Diagnosis not present

## 2017-03-02 DIAGNOSIS — N938 Other specified abnormal uterine and vaginal bleeding: Secondary | ICD-10-CM

## 2017-03-02 DIAGNOSIS — I110 Hypertensive heart disease with heart failure: Secondary | ICD-10-CM | POA: Insufficient documentation

## 2017-03-02 DIAGNOSIS — E119 Type 2 diabetes mellitus without complications: Secondary | ICD-10-CM | POA: Diagnosis not present

## 2017-03-02 DIAGNOSIS — Z01818 Encounter for other preprocedural examination: Secondary | ICD-10-CM | POA: Insufficient documentation

## 2017-03-02 DIAGNOSIS — I509 Heart failure, unspecified: Secondary | ICD-10-CM | POA: Diagnosis not present

## 2017-03-02 HISTORY — DX: Adverse effect of unspecified anesthetic, initial encounter: T41.45XA

## 2017-03-02 HISTORY — DX: Nausea with vomiting, unspecified: Z98.890

## 2017-03-02 HISTORY — DX: Other complications of anesthesia, initial encounter: T88.59XA

## 2017-03-02 HISTORY — DX: Nausea with vomiting, unspecified: R11.2

## 2017-03-02 LAB — COMPREHENSIVE METABOLIC PANEL
ALBUMIN: 4.4 g/dL (ref 3.5–5.0)
ALK PHOS: 62 U/L (ref 38–126)
ALT: 24 U/L (ref 14–54)
ANION GAP: 8 (ref 5–15)
AST: 23 U/L (ref 15–41)
BILIRUBIN TOTAL: 0.5 mg/dL (ref 0.3–1.2)
BUN: 13 mg/dL (ref 6–20)
CALCIUM: 9.2 mg/dL (ref 8.9–10.3)
CO2: 27 mmol/L (ref 22–32)
Chloride: 102 mmol/L (ref 101–111)
Creatinine, Ser: 0.55 mg/dL (ref 0.44–1.00)
GFR calc Af Amer: >60 mL/min (ref >60)
GFR calc non Af Amer: >60 mL/min (ref >60)
GLUCOSE: 107 mg/dL — AB (ref 65–99)
Potassium: 4.1 mmol/L (ref 3.5–5.1)
SODIUM: 137 mmol/L (ref 135–145)
TOTAL PROTEIN: 8.2 g/dL — AB (ref 6.5–8.1)

## 2017-03-02 LAB — CBC
HEMATOCRIT: 38.5 % (ref 35.0–47.0)
HEMOGLOBIN: 12.5 g/dL (ref 12.0–16.0)
MCH: 28.1 pg (ref 26.0–34.0)
MCHC: 32.6 g/dL (ref 32.0–36.0)
MCV: 86.3 fL (ref 80.0–100.0)
Platelets: 460 10*3/uL — ABNORMAL HIGH (ref 150–440)
RBC: 4.46 MIL/uL (ref 3.80–5.20)
RDW: 16.6 % — ABNORMAL HIGH (ref 11.5–14.5)
WBC: 15.8 10*3/uL — AB (ref 3.6–11.0)

## 2017-03-02 LAB — TYPE AND SCREEN
ABO/RH(D): O NEG
Antibody Screen: NEGATIVE

## 2017-03-02 NOTE — Telephone Encounter (Signed)
Mirena stock given to SDJ on 02/25/17 for this patient.

## 2017-03-02 NOTE — Progress Notes (Signed)
Preoperative History and Physical  Lori [name] is a 41 y.o. [phone] here for surgical management of abnormal uterine bleeding, uterine lesion (likely polyp) .     History of Present Illness: 41 y.o. [id number] female who has a history of abnormal uterine bleeding. She had an IUD in place, but this was not seen on exam or ultrasound.  A pelvic and abdominal plain film was ordered and no IUD was noted.  A sonohysterogram was performed with a noted intracavitary lesion.  She has a significant cardiac history for which she has been cleared by her PCP.   Proposed surgery: hysteroscopy, dilation and curettage, polypectomy, placement of Mirene intrauterine device   Past Medical History:  Diagnosis Date  . Bacterial vaginitis   . CHF (congestive heart failure) ([place])   . Depression   . Diabetes mellitus without complication ([place])   . History of abnormal mammogram 2013   @[facility] - NEG MRI AND BX OCT 2014 NORMAL  . History of mammogram 03/2015   BREAST MRI AT [facility] WNL  . History of Papanicolaou smear of cervix 10/08/2013; 08/04/15   -/-; ASCUS/HPV NEG;  . Hodgkin's disease ([place]) 1987   She had surgical resection of Lymph nodes and chemo + rad tx's.  Marland Kitchen Hypertension   . Hypothyroidism    2ND TO [place]   Past Surgical History:  Procedure Laterality Date  . INTRAUTERINE DEVICE (IUD) INSERTION  [id number]; 02/06/09; 12/12/13  . SPLENECTOMY    . THYROIDECTOMY     OB History  Gravida Para Term Preterm AB Living  2 2 2     2   SAB TAB Ectopic Multiple Live Births          2    # Outcome Date GA Lbr Len/2nd Weight Sex Delivery Anes PTL Lv  2 Term 07/25/97   9 lb (4.082 kg) M    LIV     Birth Comments: GDM  1 Term 07/14/93   8 lb 3 oz (3.714 kg) F Vag-Spont   LIV     Birth Comments: GDM    Patient denies any other pertinent gynecologic issues.   Current Outpatient Prescriptions on File Prior to Visit  Medication Sig Dispense Refill  . atorvastatin (LIPITOR) 20 MG tablet Take 20 mg by mouth at  bedtime.    Marland Kitchen esomeprazole (NEXIUM) 40 MG capsule Take 1 capsule (40 mg total) by mouth daily. 90 capsule 3  . glucose blood (CONTOUR NEXT TEST) test strip Use once daily. \    . KOMBIGLYZE XR 2.10-998 MG TB24 TAKE 1 TABLETS BY MOUTH ONCE DAILY. TAKE WITH EVENING MEAL.  6  . levothyroxine (SYNTHROID, LEVOTHROID) 112 MCG tablet TAKE 1 TABLET EVERY DAY ON AN EMPTY STOMACH WITH A GLASS OF WATER AT LEAST 30-60 MINS BEFORE BFAST  3  . lisinopril (PRINIVIL,ZESTRIL) 5 MG tablet Take 5 mg by mouth at bedtime.     . metoprolol succinate (TOPROL-XL) 25 MG 24 hr tablet Take 37.5 mg by mouth daily.     Marland Kitchen NOVOLOG MIX 70/30 FLEXPEN (70-30) 100 UNIT/ML FlexPen Inject 50 Units into the skin 2 (two) times daily.   0  . pravastatin (PRAVACHOL) 20 MG tablet Take 20 mg by mouth daily.     . sertraline (ZOLOFT) 50 MG tablet Take 1 tablet (50 mg total) by mouth at bedtime. 90 tablet 3   No current facility-administered medications on file prior to visit.    Allergies  Allergen Reactions  . Dapagliflozin Other (See  Comments)    Wilder Glade) UTI  . Liraglutide Nausea Only    Victoza    Social History:   reports that she quit smoking about a year ago. Her smoking use included Cigarettes. She has a 2.50 pack-year smoking history. She has never used smokeless tobacco. She reports that she drinks about 1.2 oz of alcohol per week . She reports that she does not use drugs.  Family History  Problem Relation Age of Onset  . Hypertension Mother     Review of Systems: Noncontributory  PHYSICAL EXAM: Blood pressure 118/76, height 5\' 6"  (1.676 m), weight 162 lb (73.5 kg). CONSTITUTIONAL: Well-developed, well-nourished female in no acute distress.  HENT:  Normocephalic, atraumatic, External right and left ear normal. Oropharynx is clear and moist EYES: Conjunctivae and EOM are normal. Pupils are equal, round, and reactive to light. No scleral icterus.  NECK: Normal range of motion, supple, no masses SKIN: Skin is warm  and dry. No rash noted. Not diaphoretic. No erythema. No pallor. [place]: Alert and oriented to person, place, and time. Normal reflexes, muscle tone coordination. No cranial nerve deficit noted. PSYCHIATRIC: Normal mood and affect. Normal behavior. Normal judgment and thought content. CARDIOVASCULAR: Normal heart rate noted, regular rhythm RESPIRATORY: Effort and breath sounds normal, no problems with respiration noted ABDOMEN: Soft, nontender, nondistended. PELVIC: Deferred MUSCULOSKELETAL: Normal range of motion. No edema and no tenderness. 2+ distal pulses.  Imaging Studies: Dg Abd 1 View  Result Date: 02/10/2017 CLINICAL DATA:  Complication of intrauterine device. Initial encounter. Unable to find IUD. Assess for IUD located within abdominal or pelvic cavity. EXAM: ABDOMEN - 1 VIEW COMPARISON:  None. FINDINGS: No intrauterine device is seen in the abdomen or pelvis. Cholecystectomy clips in the right upper quadrant. Normal bowel gas pattern with moderate colonic stool burden. Multiple pelvic phleboliths. No free air. No acute osseous abnormality. IMPRESSION: Intrauterine device not visualized in the abdominal or pelvic cavity. Electronically Signed   By: Jeb Levering M.D.   On: 02/10/2017 20:19    Assessment: 1. DUB (dysfunctional uterine bleeding) 2. Endometrial mass   Plan: Patient will undergo surgical management with hysteroscopy, dilation and curettage with removal of intracavitary lesion (polypectomy) and placement of intrauterine device .   The risks of surgery were discussed in detail with the patient including but not limited to: bleeding which may require transfusion or reoperation; infection which may require antibiotics; injury to surrounding organs which may involve bowel, bladder, ureters ; need for additional procedures including laparoscopy or laparotomy; thromboembolic phenomenon, surgical site problems and other postoperative/anesthesia complications. Likelihood of  success in alleviating the patient's condition was discussed. Routine postoperative instructions will be reviewed with the patient and her family in detail after surgery.  The patient concurred with the proposed plan, giving informed written consent for the surgery.  Preoperative prophylactic antibiotics and SCDs ordered on call to the OR.  Patient cleared by her PCP from cardiac standpoint.  Prentice Docker, MD 03/02/2017 10:06 AM

## 2017-03-02 NOTE — Patient Instructions (Signed)
Your procedure is scheduled on:  Thursday, March 10, 2017 Report to Same Day Surgery on the 2nd floor in the [facility]. To find out your arrival time, please call [phone] between 1PM - 3PM on: Wednesday, March 09, 2017  REMEMBER: Instructions that are not followed completely may result in serious medical risk up to and including death; or upon the discretion of your surgeon and anesthesiologist your surgery may need to be rescheduled.  Do not eat food or drink liquids after midnight. No gum chewing or hard candies.  You may however, drink WATER ONLY up to 2 hours before you are scheduled to arrive at the hospital for your procedure.  Do not drink WATER within 2 hours of your scheduled arrival to the hospital as this may lead to your procedure being delayed or rescheduled.  No Alcohol for 24 hours before or after surgery.  No Smoking for 24 hours prior to surgery.  Notify your doctor if there is any change in your medical condition (cold, fever, infection).  Do not wear jewelry, make-up, hairpins, clips or nail polish.  Do not wear lotions, powders, or perfumes.   Do not shave 48 hours prior to surgery.   Contacts and dentures may not be worn into surgery.  Do not bring valuables to the hospital. [facility] is not responsible for any belongings or valuables.   TAKE THESE MEDICATIONS THE MORNING OF SURGERY WITH A SIP OF WATER:  1.  NEXIUM (TAKE ONE CAPSULE THE NIGHT BEFORE AND ONE CAPSULE THE MORNING OF SURGERY) - helps to prevent nausea after surgery. 2.  LEVOTHYROXINE 3.  METOPROLOL  Stop KOMBIGLYZE 2 days prior to surgery. (LAST DOSE ON October 1)  DO NOT take any insulin on the morning of surgery.   NOW!  Stop Anti-inflammatories such as Advil, Aleve, Ibuprofen, Motrin, Naproxen, Naprosyn, Goodie powder, or aspirin products. (May take Tylenol or Acetaminophen if needed.)  NOW!  Stop over the counter supplements until after surgery.   If you are being  discharged the day of surgery, you will not be allowed to drive home. You will need someone to drive you home and stay with you that night.   If you are taking public transportation, you will need to have a responsible adult to with you.  Please call the number above if you have any questions about these instructions.

## 2017-03-10 ENCOUNTER — Ambulatory Visit
Admission: RE | Admit: 2017-03-10 | Discharge: 2017-03-10 | Disposition: A | Discharge status: Home / Self Care | Payer: BLUE CROSS/BLUE SHIELD | Source: Ambulatory Visit | Attending: Obstetrics and Gynecology | Admitting: Obstetrics and Gynecology

## 2017-03-10 ENCOUNTER — Ambulatory Visit: Payer: BLUE CROSS/BLUE SHIELD | Admitting: Anesthesiology

## 2017-03-10 ENCOUNTER — Encounter: Payer: Self-pay | Admitting: *Deleted

## 2017-03-10 ENCOUNTER — Encounter
Admission: RE | Disposition: A | Discharge status: Home / Self Care | Payer: Self-pay | Source: Ambulatory Visit | Attending: Obstetrics and Gynecology

## 2017-03-10 DIAGNOSIS — F329 Major depressive disorder, single episode, unspecified: Secondary | ICD-10-CM | POA: Diagnosis not present

## 2017-03-10 DIAGNOSIS — E119 Type 2 diabetes mellitus without complications: Secondary | ICD-10-CM | POA: Insufficient documentation

## 2017-03-10 DIAGNOSIS — Z87891 Personal history of nicotine dependence: Secondary | ICD-10-CM | POA: Insufficient documentation

## 2017-03-10 DIAGNOSIS — Z888 Allergy status to other drugs, medicaments and biological substances status: Secondary | ICD-10-CM | POA: Diagnosis not present

## 2017-03-10 DIAGNOSIS — Z30430 Encounter for insertion of intrauterine contraceptive device: Secondary | ICD-10-CM | POA: Insufficient documentation

## 2017-03-10 DIAGNOSIS — N920 Excessive and frequent menstruation with regular cycle: Secondary | ICD-10-CM | POA: Diagnosis not present

## 2017-03-10 DIAGNOSIS — Z79899 Other long term (current) drug therapy: Secondary | ICD-10-CM | POA: Insufficient documentation

## 2017-03-10 DIAGNOSIS — I509 Heart failure, unspecified: Secondary | ICD-10-CM | POA: Insufficient documentation

## 2017-03-10 DIAGNOSIS — N9489 Other specified conditions associated with female genital organs and menstrual cycle: Secondary | ICD-10-CM | POA: Diagnosis present

## 2017-03-10 DIAGNOSIS — N938 Other specified abnormal uterine and vaginal bleeding: Secondary | ICD-10-CM | POA: Insufficient documentation

## 2017-03-10 DIAGNOSIS — N858 Other specified noninflammatory disorders of uterus: Secondary | ICD-10-CM | POA: Diagnosis not present

## 2017-03-10 DIAGNOSIS — E039 Hypothyroidism, unspecified: Secondary | ICD-10-CM | POA: Diagnosis not present

## 2017-03-10 DIAGNOSIS — E669 Obesity, unspecified: Secondary | ICD-10-CM | POA: Insufficient documentation

## 2017-03-10 DIAGNOSIS — Z794 Long term (current) use of insulin: Secondary | ICD-10-CM | POA: Insufficient documentation

## 2017-03-10 DIAGNOSIS — I110 Hypertensive heart disease with heart failure: Secondary | ICD-10-CM | POA: Diagnosis not present

## 2017-03-10 DIAGNOSIS — K219 Gastro-esophageal reflux disease without esophagitis: Secondary | ICD-10-CM | POA: Insufficient documentation

## 2017-03-10 DIAGNOSIS — N840 Polyp of corpus uteri: Secondary | ICD-10-CM | POA: Diagnosis not present

## 2017-03-10 HISTORY — PX: INTRAUTERINE DEVICE (IUD) INSERTION: SHX5877

## 2017-03-10 HISTORY — PX: DILATATION & CURETTAGE/HYSTEROSCOPY WITH MYOSURE: SHX6511

## 2017-03-10 LAB — GLUCOSE, CAPILLARY
GLUCOSE-CAPILLARY: 114 mg/dL — AB (ref 65–99)
Glucose-Capillary: 94 mg/dL (ref 65–99)

## 2017-03-10 LAB — ABO/RH: ABO/RH(D): O NEG

## 2017-03-10 LAB — POCT PREGNANCY, URINE: PREG TEST UR: NEGATIVE

## 2017-03-10 SURGERY — DILATATION & CURETTAGE/HYSTEROSCOPY WITH MYOSURE
Anesthesia: General | Site: Uterus | Wound class: Clean Contaminated

## 2017-03-10 MED ORDER — LIDOCAINE HCL (PF) 2 % IJ SOLN
INTRAMUSCULAR | Status: AC
  Filled 2017-03-10: qty 4

## 2017-03-10 MED ORDER — PROPOFOL 10 MG/ML IV BOLUS
INTRAVENOUS | Status: AC
  Filled 2017-03-10: qty 20

## 2017-03-10 MED ORDER — KETOROLAC TROMETHAMINE 30 MG/ML IJ SOLN
INTRAMUSCULAR | Status: DC | PRN
  Administered 2017-03-10: 30 mg via INTRAVENOUS

## 2017-03-10 MED ORDER — MIDAZOLAM HCL 2 MG/2ML IJ SOLN
INTRAMUSCULAR | Status: AC
  Filled 2017-03-10: qty 2

## 2017-03-10 MED ORDER — PROPOFOL 500 MG/50ML IV EMUL
INTRAVENOUS | Status: DC | PRN
  Administered 2017-03-10: 150 ug/kg/min via INTRAVENOUS

## 2017-03-10 MED ORDER — IBUPROFEN 600 MG PO TABS
600.0000 mg | ORAL_TABLET | Freq: Four times a day (QID) | ORAL | Status: DC | PRN
  Filled 2017-03-10: qty 1

## 2017-03-10 MED ORDER — SCOPOLAMINE 1 MG/3DAYS TD PT72
1.0000 | MEDICATED_PATCH | TRANSDERMAL | Status: DC
  Administered 2017-03-10: 1.5 mg via TRANSDERMAL

## 2017-03-10 MED ORDER — MEPERIDINE HCL 50 MG/ML IJ SOLN: 6.2500 mg | INTRAMUSCULAR | Status: DC | PRN

## 2017-03-10 MED ORDER — ONDANSETRON HCL 4 MG/2ML IJ SOLN
INTRAMUSCULAR | Status: DC | PRN
  Administered 2017-03-10 (×2): 4 mg via INTRAVENOUS

## 2017-03-10 MED ORDER — OXYCODONE HCL 5 MG/5ML PO SOLN: 5.0000 mg | Freq: Once | ORAL | Status: DC | PRN

## 2017-03-10 MED ORDER — DEXAMETHASONE SODIUM PHOSPHATE 10 MG/ML IJ SOLN
INTRAMUSCULAR | Status: DC | PRN
  Administered 2017-03-10: 10 mg via INTRAVENOUS

## 2017-03-10 MED ORDER — PROPOFOL 500 MG/50ML IV EMUL
INTRAVENOUS | Status: AC
  Filled 2017-03-10: qty 50

## 2017-03-10 MED ORDER — SODIUM CHLORIDE 0.9 % IV SOLN
INTRAVENOUS | Status: DC
  Administered 2017-03-10: 14:00:00 via INTRAVENOUS

## 2017-03-10 MED ORDER — ONDANSETRON HCL 4 MG/2ML IJ SOLN
INTRAMUSCULAR | Status: AC
  Filled 2017-03-10: qty 2

## 2017-03-10 MED ORDER — DEXAMETHASONE SODIUM PHOSPHATE 10 MG/ML IJ SOLN
INTRAMUSCULAR | Status: AC
  Filled 2017-03-10: qty 1

## 2017-03-10 MED ORDER — FENTANYL CITRATE (PF) 100 MCG/2ML IJ SOLN
INTRAMUSCULAR | Status: AC
Start: 2017-03-10 — End: 2017-03-10
  Filled 2017-03-10: qty 2

## 2017-03-10 MED ORDER — FENTANYL CITRATE (PF) 100 MCG/2ML IJ SOLN
INTRAMUSCULAR | Status: DC | PRN
Start: 2017-03-10 — End: 2017-03-10
  Administered 2017-03-10 (×4): 25 ug via INTRAVENOUS

## 2017-03-10 MED ORDER — SILVER NITRATE-POT NITRATE 75-25 % EX MISC
CUTANEOUS | Status: DC | PRN
  Administered 2017-03-10: 2

## 2017-03-10 MED ORDER — PROPOFOL 10 MG/ML IV BOLUS
INTRAVENOUS | Status: DC | PRN
  Administered 2017-03-10: 120 mg via INTRAVENOUS
  Administered 2017-03-10: 40 mg via INTRAVENOUS

## 2017-03-10 MED ORDER — MIDAZOLAM HCL 2 MG/2ML IJ SOLN
INTRAMUSCULAR | Status: DC | PRN
  Administered 2017-03-10: 2 mg via INTRAVENOUS

## 2017-03-10 MED ORDER — OXYCODONE HCL 5 MG PO TABS: 5.0000 mg | ORAL_TABLET | Freq: Once | ORAL | Status: DC | PRN

## 2017-03-10 MED ORDER — FENTANYL CITRATE (PF) 100 MCG/2ML IJ SOLN: 25.0000 ug | INTRAMUSCULAR | Status: DC | PRN

## 2017-03-10 MED ORDER — PROMETHAZINE HCL 25 MG/ML IJ SOLN: 6.2500 mg | INTRAMUSCULAR | Status: DC | PRN

## 2017-03-10 MED ORDER — LIDOCAINE HCL (CARDIAC) 20 MG/ML IV SOLN
INTRAVENOUS | Status: DC | PRN
  Administered 2017-03-10: 80 mg via INTRAVENOUS

## 2017-03-10 MED ORDER — SCOPOLAMINE 1 MG/3DAYS TD PT72
MEDICATED_PATCH | TRANSDERMAL | Status: AC
  Administered 2017-03-10: 1.5 mg via TRANSDERMAL
  Filled 2017-03-10: qty 1

## 2017-03-10 MED ORDER — KETOROLAC TROMETHAMINE 30 MG/ML IJ SOLN
INTRAMUSCULAR | Status: AC
  Filled 2017-03-10: qty 1

## 2017-03-10 SURGICAL SUPPLY — 26 items
ABLATOR ENDOMETRIAL MYOSURE (ABLATOR) ×2 IMPLANT
BAG URO DRAIN 2000ML W/SPOUT (MISCELLANEOUS) IMPLANT
CANISTER SUC SOCK COL 7IN (MISCELLANEOUS) ×2 IMPLANT
CATH FOLEY 2WAY  5CC 16FR (CATHETERS) ×1
CATH ROBINSON RED A/P 16FR (CATHETERS) IMPLANT
CATH URTH 16FR FL 2W BLN LF (CATHETERS) ×1 IMPLANT
DEVICE MYOSURE LITE (MISCELLANEOUS) IMPLANT
ELECT REM PT RETURN 9FT ADLT (ELECTROSURGICAL)
ELECTRODE REM PT RTRN 9FT ADLT (ELECTROSURGICAL) IMPLANT
GLOVE BIO SURGEON STRL SZ7 (GLOVE) ×8 IMPLANT
GLOVE BIO SURGEON STRL SZ7.5 (GLOVE) ×2 IMPLANT
GLOVE BIOGEL PI IND STRL 7.5 (GLOVE) ×4 IMPLANT
GLOVE BIOGEL PI INDICATOR 7.5 (GLOVE) ×4
GOWN STRL REUS W/ TWL LRG LVL3 (GOWN DISPOSABLE) ×2 IMPLANT
GOWN STRL REUS W/TWL LRG LVL3 (GOWN DISPOSABLE) ×2
IV LACTATED RINGER IRRG 3000ML (IV SOLUTION) ×2
IV LR IRRIG 3000ML ARTHROMATIC (IV SOLUTION) ×2 IMPLANT
KIT RM TURNOVER CYSTO AR (KITS) ×2 IMPLANT
PACK DNC HYST (MISCELLANEOUS) ×2 IMPLANT
PAD OB MATERNITY 4.3X12.25 (PERSONAL CARE ITEMS) ×2 IMPLANT
PAD PREP 24X41 OB/GYN DISP (PERSONAL CARE ITEMS) ×2 IMPLANT
SYRINGE 10CC LL (SYRINGE) IMPLANT
TOWEL OR 17X26 4PK STRL BLUE (TOWEL DISPOSABLE) ×2 IMPLANT
TUBING CONNECTING 10 (TUBING) ×2 IMPLANT
TUBING HYSTEROSCOPY DOLPHIN (MISCELLANEOUS) ×2 IMPLANT
mirena IUD ×2 IMPLANT

## 2017-03-10 NOTE — Discharge Instructions (Signed)

## 2017-03-10 NOTE — Transfer of Care (Signed)
Immediate Anesthesia Transfer of Care Note  Patient: Lori [name]  Procedure(s) Performed: DILATATION & CURETTAGE/HYSTEROSCOPY WITH MYOSURE (N/A Uterus) INTRAUTERINE DEVICE (IUD) INSERTION (N/A Uterus)  Patient Location: PACU  Anesthesia Type:General  Level of Consciousness: awake and patient cooperative  Airway & Oxygen Therapy: Patient Spontanous Breathing and Patient connected to face mask oxygen  Post-op Assessment: Report given to RN and Post -op Vital signs reviewed and stable  Post vital signs: Reviewed and stable  Last Vitals:  Vitals:   03/10/17 1305 03/10/17 1541  BP: 116/78 118/65  Pulse: 92 (!) 103  Resp: 16 15  Temp:  36.6 C  SpO2: 100% 98%    Last Pain:  Vitals:   03/10/17 1541  TempSrc:   PainSc: 0-No pain         Complications: No apparent anesthesia complications

## 2017-03-10 NOTE — Op Note (Signed)
  Operative Note    Pre-Op Diagnosis:  1) menorrhagia with regular cycle (dysfunctional uterine bleeding0 2) endometrial polyp  Post-Op Diagnosis:  1) menorrhagia with regular cycle (dysfunctional uterine bleeding0 2) endometrial polyp  Procedures:  1. Hysteroscopy 2. Dilation and curettage 3. Polypectomy 4. Placement of intrauterine device (IUD)  Primary Surgeon: Prentice Docker, MD   EBL: 10 mL   IVF: 800 mL   Urine output: 50 mL  Fluid Deficit: 100 mL  Specimens: endometrial curettings and endometrial polyp  Drains: none  Complications: None   Disposition: PACU   Condition: Stable   Findings: large intracavitary mass, likely polyp, otherwise normal appearing uterine cavity  PROCEDURE IN DETAIL:  After informed consent was obtained, the patient was taken to the operating room where anesthesia was obtained without difficulty. The patient was positioned in the dorsal lithotomy position in candy cane stirrups.  The patient's bladder was catheterized with an in and out foley catheter.  The patient was examined under anesthesia, with the above noted findings.  The bi-valved speculum was placed inside the patient's vagina, and the the anterior lip of the cervix was seen and grasped with the tenaculum.  Then the cervix was progressively dilated to a 6 Hegar dilator.  The hysteroscope was introduced, with the above noted findings. The Myosure hysteroscopic instrument was introduced and the polyp was removed in its entirety.   The hystersocope was removed and the uterine cavity was curetted until a gritty texture was noted.  Excellent hemostasis was noted.  The hysteroscope was removed and the intrauterine device was inserted in accordance with the manufacturer's recommendations without difficulty.  All instruments were removed, with excellent hemostasis noted throughout.  She was then taken out of dorsal lithotomy.  The patient tolerated the procedure well.  Sponge, lap,  needle, and instrument counts were correct x 2.  VTE prophylaxis: SCDs. Antibiotic prophylaxis: none indicated nor given. She was awakened in the operating room and was taken to the PACU in stable condition.   Prentice Docker, MD 03/10/2017 3:28 PM

## 2017-03-10 NOTE — H&P (View-Only) (Signed)
Preoperative History and Physical  Lori [name] is a 41 y.o. [phone] here for surgical management of abnormal uterine bleeding, uterine lesion (likely polyp) .     History of Present Illness: 41 y.o. [id number] female who has a history of abnormal uterine bleeding. She had an IUD in place, but this was not seen on exam or ultrasound.  A pelvic and abdominal plain film was ordered and no IUD was noted.  A sonohysterogram was performed with a noted intracavitary lesion.  She has a significant cardiac history for which she has been cleared by her PCP.   Proposed surgery: hysteroscopy, dilation and curettage, polypectomy, placement of Mirene intrauterine device   Past Medical History:  Diagnosis Date  . Bacterial vaginitis   . CHF (congestive heart failure) ([place])   . Depression   . Diabetes mellitus without complication ([place])   . History of abnormal mammogram 2013   @[facility] - NEG MRI AND BX OCT 2014 NORMAL  . History of mammogram 03/2015   BREAST MRI AT [facility] WNL  . History of Papanicolaou smear of cervix 10/08/2013; 08/04/15   -/-; ASCUS/HPV NEG;  . Hodgkin's disease ([place]) 1987   She had surgical resection of Lymph nodes and chemo + rad tx's.  Marland Kitchen Hypertension   . Hypothyroidism    2ND TO [place]   Past Surgical History:  Procedure Laterality Date  . INTRAUTERINE DEVICE (IUD) INSERTION  [id number]; 02/06/09; 12/12/13  . SPLENECTOMY    . THYROIDECTOMY     OB History  Gravida Para Term Preterm AB Living  2 2 2     2   SAB TAB Ectopic Multiple Live Births          2    # Outcome Date GA Lbr Len/2nd Weight Sex Delivery Anes PTL Lv  2 Term 07/25/97   9 lb (4.082 kg) M    LIV     Birth Comments: GDM  1 Term 07/14/93   8 lb 3 oz (3.714 kg) F Vag-Spont   LIV     Birth Comments: GDM    Patient denies any other pertinent gynecologic issues.   Current Outpatient Prescriptions on File Prior to Visit  Medication Sig Dispense Refill  . atorvastatin (LIPITOR) 20 MG tablet Take 20 mg by mouth at  bedtime.    Marland Kitchen esomeprazole (NEXIUM) 40 MG capsule Take 1 capsule (40 mg total) by mouth daily. 90 capsule 3  . glucose blood (CONTOUR NEXT TEST) test strip Use once daily. \    . KOMBIGLYZE XR 2.10-998 MG TB24 TAKE 1 TABLETS BY MOUTH ONCE DAILY. TAKE WITH EVENING MEAL.  6  . levothyroxine (SYNTHROID, LEVOTHROID) 112 MCG tablet TAKE 1 TABLET EVERY DAY ON AN EMPTY STOMACH WITH A GLASS OF WATER AT LEAST 30-60 MINS BEFORE BFAST  3  . lisinopril (PRINIVIL,ZESTRIL) 5 MG tablet Take 5 mg by mouth at bedtime.     . metoprolol succinate (TOPROL-XL) 25 MG 24 hr tablet Take 37.5 mg by mouth daily.     Marland Kitchen NOVOLOG MIX 70/30 FLEXPEN (70-30) 100 UNIT/ML FlexPen Inject 50 Units into the skin 2 (two) times daily.   0  . pravastatin (PRAVACHOL) 20 MG tablet Take 20 mg by mouth daily.     . sertraline (ZOLOFT) 50 MG tablet Take 1 tablet (50 mg total) by mouth at bedtime. 90 tablet 3   No current facility-administered medications on file prior to visit.    Allergies  Allergen Reactions  . Dapagliflozin Other (See  Comments)    [place]) UTI  . Liraglutide Nausea Only    Victoza    Social History:   reports that she quit smoking about a year ago. Her smoking use included Cigarettes. She has a 2.50 pack-year smoking history. She has never used smokeless tobacco. She reports that she drinks about 1.2 oz of alcohol per week . She reports that she does not use drugs.  Family History  Problem Relation Age of Onset  . Hypertension Mother     Review of Systems: Noncontributory  PHYSICAL EXAM: Blood pressure 118/76, height 5\' 6"  (1.676 m), weight 162 lb (73.5 kg). CONSTITUTIONAL: Well-developed, well-nourished female in no acute distress.  HENT:  Normocephalic, atraumatic, External right and left ear normal. Oropharynx is clear and moist EYES: Conjunctivae and EOM are normal. Pupils are equal, round, and reactive to light. No scleral icterus.  NECK: Normal range of motion, supple, no masses SKIN: Skin is warm  and dry. No rash noted. Not diaphoretic. No erythema. No pallor. [place]: Alert and oriented to person, place, and time. Normal reflexes, muscle tone coordination. No cranial nerve deficit noted. PSYCHIATRIC: Normal mood and affect. Normal behavior. Normal judgment and thought content. CARDIOVASCULAR: Normal heart rate noted, regular rhythm RESPIRATORY: Effort and breath sounds normal, no problems with respiration noted ABDOMEN: Soft, nontender, nondistended. PELVIC: Deferred MUSCULOSKELETAL: Normal range of motion. No edema and no tenderness. 2+ distal pulses.  Imaging Studies: Dg Abd 1 View  Result Date: 02/10/2017 CLINICAL DATA:  Complication of intrauterine device. Initial encounter. Unable to find IUD. Assess for IUD located within abdominal or pelvic cavity. EXAM: ABDOMEN - 1 VIEW COMPARISON:  None. FINDINGS: No intrauterine device is seen in the abdomen or pelvis. Cholecystectomy clips in the right upper quadrant. Normal bowel gas pattern with moderate colonic stool burden. Multiple pelvic phleboliths. No free air. No acute osseous abnormality. IMPRESSION: Intrauterine device not visualized in the abdominal or pelvic cavity. Electronically Signed   By: Jeb Levering M.D.   On: 02/10/2017 20:19    Assessment: 1. DUB (dysfunctional uterine bleeding) 2. Endometrial mass   Plan: Patient will undergo surgical management with hysteroscopy, dilation and curettage with removal of intracavitary lesion (polypectomy) and placement of intrauterine device .   The risks of surgery were discussed in detail with the patient including but not limited to: bleeding which may require transfusion or reoperation; infection which may require antibiotics; injury to surrounding organs which may involve bowel, bladder, ureters ; need for additional procedures including laparoscopy or laparotomy; thromboembolic phenomenon, surgical site problems and other postoperative/anesthesia complications. Likelihood of  success in alleviating the patient's condition was discussed. Routine postoperative instructions will be reviewed with the patient and her family in detail after surgery.  The patient concurred with the proposed plan, giving informed written consent for the surgery.  Preoperative prophylactic antibiotics and SCDs ordered on call to the OR.  Patient cleared by her PCP from cardiac standpoint.  Prentice Docker, MD 03/02/2017 10:06 AM

## 2017-03-10 NOTE — Anesthesia Procedure Notes (Signed)
Procedure Name: LMA Insertion Date/Time: 03/10/2017 2:31 PM Performed by: Dionne Bucy Pre-anesthesia Checklist: Patient identified, Patient being monitored, Timeout performed, Emergency Drugs available and Suction available Patient Re-evaluated:Patient Re-evaluated prior to induction Oxygen Delivery Method: Circle system utilized Preoxygenation: Pre-oxygenation with 100% oxygen Induction Type: IV induction Ventilation: Mask ventilation without difficulty LMA: LMA inserted LMA Size: 4.5 Tube type: Oral Number of attempts: 1 Placement Confirmation: positive ETCO2 and breath sounds checked- equal and bilateral Tube secured with: Tape Dental Injury: Teeth and Oropharynx as per pre-operative assessment

## 2017-03-10 NOTE — Anesthesia Postprocedure Evaluation (Signed)
Anesthesia Post Note  Patient: Lori [name]  Procedure(s) Performed: DILATATION & CURETTAGE/HYSTEROSCOPY WITH MYOSURE (N/A Uterus) INTRAUTERINE DEVICE (IUD) INSERTION (N/A Uterus)  Patient location during evaluation: PACU Anesthesia Type: General Level of consciousness: awake and alert Pain management: pain level controlled Vital Signs Assessment: post-procedure vital signs reviewed and stable Respiratory status: spontaneous breathing, nonlabored ventilation, respiratory function stable and patient connected to nasal cannula oxygen Cardiovascular status: blood pressure returned to baseline and stable Postop Assessment: no apparent nausea or vomiting Anesthetic complications: no     Last Vitals:  Vitals:   03/10/17 1611 03/10/17 1629  BP: 129/86 101/70  Pulse: 84 83  Resp: 16 14  Temp: (!) 36.4 C (!) 36.2 C  SpO2: 100% 100%    Last Pain:  Vitals:   03/10/17 1611  TempSrc:   PainSc: 0-No pain                 Precious Haws Janece Laidlaw

## 2017-03-10 NOTE — Interval H&P Note (Signed)
History and Physical Interval Note:  03/10/2017 2:21 PM  [facility]  has presented today for surgery, with the diagnosis of ENDOMETRIAL MASS,DYSFUNCTIONAL UTERINE BLEEDING  The various methods of treatment have been discussed with the patient and family. After consideration of risks, benefits and other options for treatment, the patient has consented to  Procedure(s): [place] (N/A) INTRAUTERINE DEVICE (IUD) INSERTION (N/A) as a surgical intervention .  The patient's history has been reviewed, patient examined, no change in status, stable for surgery.  I have reviewed the patient's chart and labs.  Questions were answered to the patient's satisfaction.     Prentice Docker, MD 03/10/2017 2:21 PM

## 2017-03-10 NOTE — Anesthesia Preprocedure Evaluation (Signed)
Anesthesia Evaluation  Patient identified by MRN, date of birth, ID band Patient awake    Reviewed: Allergy & Precautions, NPO status , Patient's Chart, lab work & pertinent test results  History of Anesthesia Complications (+) PONV and history of anesthetic complications  Airway Mallampati: II  TM Distance: >3 FB Neck ROM: Full    Dental no notable dental hx.    Pulmonary neg sleep apnea, neg COPD, Current Smoker,    breath sounds clear to auscultation- rhonchi (-) wheezing      Cardiovascular hypertension, Pt. on medications +CHF  (-) CAD, (-) Past MI and (-) Cardiac Stents  Rhythm:Regular Rate:Normal - Systolic murmurs and - Diastolic murmurs Echo 9/32/35: MILD LV DYSFUNCTION (EF 45%) NORMAL LA PRESSURES WITH DIASTOLIC DYSFUNCTION NORMAL RIGHT VENTRICULAR SYSTOLIC FUNCTION VALVULAR REGURGITATION: TRIVIAL AR, MILD MR, TRIVIAL PR, TRIVIAL TR NO VALVULAR STENOSIS MILD TO MODERATE MR   Neuro/Psych PSYCHIATRIC DISORDERS Anxiety Depression negative neurological ROS     GI/Hepatic Neg liver ROS, GERD  ,  Endo/Other  diabetes, Insulin DependentHypothyroidism   Renal/GU negative Renal ROS     Musculoskeletal negative musculoskeletal ROS (+)   Abdominal (+) - obese,   Peds  Hematology negative hematology ROS (+)   Anesthesia Other Findings Past Medical History: No date: Bacterial vaginitis No date: CHF (congestive heart failure) ([facility]) No date: Complication of anesthesia     Comment:  nausea/vomiting No date: Depression No date: Diabetes mellitus without complication ([place]) 5732: History of abnormal mammogram     Comment:  @[facility] - NEG MRI AND BX OCT 2014 NORMAL 03/2015: History of mammogram     Comment:  BREAST MRI AT [facility] WNL 10/08/2013; 08/04/15: History of Papanicolaou smear of cervix     Comment:  -/-; ASCUS/HPV NEG; 1987: Hodgkin's disease ([place])     Comment:  She had surgical resection of Lymph  nodes and chemo +               rad tx's. No date: Hypertension No date: Hypothyroidism     Comment:  2ND TO [place] No date: PONV (postoperative nausea and vomiting)   Reproductive/Obstetrics                             Anesthesia Physical Anesthesia Plan  ASA: III  Anesthesia Plan: General   Post-op Pain Management:    Induction: Intravenous  PONV Risk Score and Plan: 2 and Ondansetron, Propofol infusion and Dexamethasone  Airway Management Planned: LMA  Additional Equipment:   Intra-op Plan:   Post-operative Plan:   Informed Consent: I have reviewed the patients History and Physical, chart, labs and discussed the procedure including the risks, benefits and alternatives for the proposed anesthesia with the patient or authorized representative who has indicated his/her understanding and acceptance.   Dental advisory given  Plan Discussed with: CRNA and Anesthesiologist  Anesthesia Plan Comments:         Anesthesia Quick Evaluation

## 2017-03-10 NOTE — Anesthesia Post-op Follow-up Note (Signed)
Anesthesia QCDR form completed.        

## 2017-03-11 ENCOUNTER — Encounter: Payer: Self-pay | Admitting: Obstetrics and Gynecology

## 2017-03-14 LAB — SURGICAL PATHOLOGY

## 2017-03-23 ENCOUNTER — Ambulatory Visit: Payer: BLUE CROSS/BLUE SHIELD | Admitting: Obstetrics and Gynecology

## 2017-03-24 DIAGNOSIS — L720 Epidermal cyst: Secondary | ICD-10-CM | POA: Diagnosis not present

## 2017-03-24 DIAGNOSIS — L988 Other specified disorders of the skin and subcutaneous tissue: Secondary | ICD-10-CM | POA: Diagnosis not present

## 2017-03-24 DIAGNOSIS — D225 Melanocytic nevi of trunk: Secondary | ICD-10-CM | POA: Diagnosis not present

## 2017-03-24 DIAGNOSIS — D2230 Melanocytic nevi of unspecified part of face: Secondary | ICD-10-CM | POA: Diagnosis not present

## 2017-04-13 ENCOUNTER — Encounter: Payer: Self-pay | Admitting: Obstetrics and Gynecology

## 2017-04-13 ENCOUNTER — Ambulatory Visit (INDEPENDENT_AMBULATORY_CARE_PROVIDER_SITE_OTHER): Payer: BLUE CROSS/BLUE SHIELD | Admitting: Obstetrics and Gynecology

## 2017-04-13 VITALS — BP 124/78 | Ht 66.0 in | Wt 162.0 lb

## 2017-04-13 DIAGNOSIS — Z09 Encounter for follow-up examination after completed treatment for conditions other than malignant neoplasm: Secondary | ICD-10-CM

## 2017-04-13 NOTE — Progress Notes (Signed)
   Postoperative Follow-up Patient presents post op from hysteroscopy, D&C, polypectomy, Mirena IUD placement 4weeks ago for abnormal uterine bleeding.  Subjective: Patient reports marked improvement in her preop symptoms. Eating a regular diet without difficulty. The patient is not having any pain.  Activity: normal activities of daily living.   Objective: Vitals:   04/13/17 1542  BP: 124/78   Vital Signs: BP 124/78   Ht 5\' 6"  (1.676 m)   Wt 162 lb (73.5 kg)   BMI 26.15 kg/m  Constitutional: Well nourished, well developed female in no acute distress.  HEENT: normal Skin: Warm and dry.  Extremity: no edema  Abdomen: Soft, non-tender, normal bowel sounds; no bruits, organomegaly or masses.  Pelvic exam: (female chaperone present) is not limited by body habitus EGBUS: within normal limits Vagina: within normal limits and with normal mucosa blood in the vault Cervix: closed, without lesions, IUD strings visible and not trimmed.    Assessment: 41 y.o. s/p hysteroscopy, D&C, polypectomy, and IUD (Mirena) placement progressing well  Plan: Patient has done well after surgery with no apparent complications.  I have discussed the post-operative course to date, and the expected progress moving forward.  The patient understands what complications to be concerned about.  I will see the patient in routine follow up, or sooner if needed.    Activity plan: No restriction.  Keep routine follow up next year when due.   Prentice Docker, MD 04/13/2017, 3:47 PM

## 2017-05-12 DIAGNOSIS — E039 Hypothyroidism, unspecified: Secondary | ICD-10-CM | POA: Diagnosis not present

## 2017-05-12 DIAGNOSIS — E109 Type 1 diabetes mellitus without complications: Secondary | ICD-10-CM | POA: Diagnosis not present

## 2017-05-12 DIAGNOSIS — I10 Essential (primary) hypertension: Secondary | ICD-10-CM | POA: Diagnosis not present

## 2017-05-12 DIAGNOSIS — E7800 Pure hypercholesterolemia, unspecified: Secondary | ICD-10-CM | POA: Diagnosis not present

## 2017-05-18 ENCOUNTER — Ambulatory Visit: Payer: BLUE CROSS/BLUE SHIELD | Admitting: Physician Assistant

## 2017-05-18 ENCOUNTER — Encounter: Payer: Self-pay | Admitting: Physician Assistant

## 2017-05-18 VITALS — BP 106/74 | HR 76 | Temp 98.3°F | Resp 16 | Wt 162.0 lb

## 2017-05-18 DIAGNOSIS — J069 Acute upper respiratory infection, unspecified: Secondary | ICD-10-CM

## 2017-05-18 DIAGNOSIS — B9789 Other viral agents as the cause of diseases classified elsewhere: Secondary | ICD-10-CM | POA: Diagnosis not present

## 2017-05-18 MED ORDER — BENZONATATE 100 MG PO CAPS: 100.0000 mg | ORAL_CAPSULE | Freq: Three times a day (TID) | ORAL | 0 refills | Status: AC | PRN

## 2017-05-18 NOTE — Patient Instructions (Signed)
Upper Respiratory Infection, Adult Most upper respiratory infections (URIs) are caused by a virus. A URI affects the nose, throat, and upper air passages. The most common type of URI is often called "the common cold." Follow these instructions at home:  Take medicines only as told by your doctor.  Gargle warm saltwater or take cough drops to comfort your throat as told by your doctor.  Use a warm mist humidifier or inhale steam from a shower to increase air moisture. This may make it easier to breathe.  Drink enough fluid to keep your pee (urine) clear or pale yellow.  Eat soups and other clear broths.  Have a healthy diet.  Rest as needed.  Go back to work when your fever is gone or your doctor says it is okay. ? You may need to stay home longer to avoid giving your URI to others. ? You can also wear a face mask and wash your hands often to prevent spread of the virus.  Use your inhaler more if you have asthma.  Do not use any tobacco products, including cigarettes, chewing tobacco, or electronic cigarettes. If you need help quitting, ask your doctor. Contact a doctor if:  You are getting worse, not better.  Your symptoms are not helped by medicine.  You have chills.  You are getting more short of breath.  You have brown or red mucus.  You have yellow or brown discharge from your nose.  You have pain in your face, especially when you bend forward.  You have a fever.  You have puffy (swollen) neck glands.  You have pain while swallowing.  You have white areas in the back of your throat. Get help right away if:  You have very bad or constant: ? Headache. ? Ear pain. ? Pain in your forehead, behind your eyes, and over your cheekbones (sinus pain). ? Chest pain.  You have long-lasting (chronic) lung disease and any of the following: ? Wheezing. ? Long-lasting cough. ? Coughing up blood. ? A change in your usual mucus.  You have a stiff neck.  You have  changes in your: ? Vision. ? Hearing. ? Thinking. ? Mood. This information is not intended to replace advice given to you by your health care provider. Make sure you discuss any questions you have with your health care provider. Document Released: 11/10/2007 Document Revised: 01/25/2016 Document Reviewed: 08/29/2013 Elsevier Interactive Patient Education  2018 [employer]  

## 2017-05-18 NOTE — Progress Notes (Signed)
[place]  Chief Complaint  Patient presents with  . URI    Started about five days ago    Subjective:    Patient ID: Lori [name], female    DOB: 04-01-1976, 41 y.o.   MRN: [id number]  Upper Respiratory Infection: Lori [name] is a 41 y.o. female with a past medical history significant for CHF, Type II DM complaining of symptoms of a URI, possible sinusitis. Symptoms include left ear pain, congestion and cough. Onset of symptoms was 5 days ago, gradually worsening since that time. She also c/o congestion, cough described as productive and post nasal drip for the past 5 days .  She is drinking plenty of fluids. Evaluation to date: none. Treatment to date: cough suppressants. The treatment has provided no.   Review of Systems  Constitutional: Positive for fatigue. Negative for activity change, appetite change, chills, diaphoresis, fever and unexpected weight change.  HENT: Positive for congestion, ear pain, postnasal drip, sinus pressure and sinus pain. Negative for ear discharge, nosebleeds, sore throat, tinnitus, trouble swallowing and voice change.   Eyes: Positive for discharge. Negative for photophobia, pain, redness, itching and visual disturbance.  Respiratory: Positive for cough and chest tightness. Negative for apnea, choking, shortness of breath, wheezing and stridor.   Gastrointestinal: Negative.   Neurological: Positive for headaches. Negative for dizziness and light-headedness.       Objective:   BP 106/74 (BP Location: Left Arm, Patient Position: Sitting, Cuff Size: Normal)   Pulse 76   Temp 98.3 F (36.8 C) (Oral)   Resp 16   Wt 162 lb (73.5 kg)   LMP 05/11/2017   BMI 26.15 kg/m   Patient Active Problem List   Diagnosis Date Noted  . Endometrial mass 01/18/2017  . DUB (dysfunctional uterine bleeding) 01/18/2017  . Bacterial vaginosis 01/18/2017  . Allergic rhinitis 06/03/2015  . Anxiety 06/03/2015  . Acute systolic  heart failure ([place]) 06/03/2015  . Clinical depression 06/03/2015  . Diabetes mellitus, type 2 ([place]) 06/03/2015  . Acid reflux 06/03/2015  . History of biliary T-tube placement 06/03/2015  . HD (Hodgkin's disease) ([place]) 06/03/2015  . RAD (reactive airway disease) 06/03/2015  . Fast heart beat 06/03/2015  . Chronic systolic heart failure ([place]) 05/23/2014  . 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 Acquired hypothyroidism 04/05/2013    Outpatient Encounter Medications as of 05/18/2017  Medication Sig  . atorvastatin (LIPITOR) 20 MG tablet Take 20 mg by mouth at bedtime.  Marland Kitchen esomeprazole (NEXIUM) 40 MG capsule Take 1 capsule (40 mg total) by mouth daily.  Marland Kitchen KOMBIGLYZE XR 2.10-998 MG TB24 TAKE 1 TABLETS BY MOUTH ONCE DAILY. TAKE WITH EVENING MEAL.  Marland Kitchen levothyroxine (SYNTHROID, LEVOTHROID) 112 MCG tablet TAKE 1 TABLET EVERY DAY ON AN EMPTY STOMACH WITH A GLASS OF WATER AT LEAST 30-60 MINS BEFORE BFAST  . lisinopril (PRINIVIL,ZESTRIL) 5 MG tablet Take 5 mg by mouth at bedtime.   . metoprolol succinate (TOPROL-XL) 25 MG 24 hr tablet Take 37.5 mg by mouth daily.   Marland Kitchen NOVOLOG MIX 70/30 FLEXPEN (70-30) 100 UNIT/ML FlexPen Inject 50 Units into the skin 2 (two) times daily.   . sertraline (ZOLOFT) 50 MG tablet Take 1 tablet (50 mg total) by mouth at bedtime.   No facility-administered encounter medications on file as of 05/18/2017.     Allergies  Allergen Reactions  . Dapagliflozin Other (See Comments)    Wilder Glade) UTI  . Liraglutide Nausea Only    Victoza  Physical Exam  Constitutional: She appears well-developed and well-nourished.  HENT:  Right Ear: External ear normal.  Left Ear: External ear normal.  Mouth/Throat: Oropharynx is clear and moist. No oropharyngeal exudate.  Eyes: Right eye exhibits discharge. Left eye exhibits discharge.  Neck: Neck supple.  Cardiovascular: Normal rate and regular rhythm.  Pulmonary/Chest: Effort normal and breath sounds  normal. No respiratory distress. She has no rales.  Lymphadenopathy:    She has no cervical adenopathy.  Skin: Skin is warm and dry.  Psychiatric: She has a normal mood and affect. Her behavior is normal.       Assessment & Plan:  1. Viral URI with cough  May do plain mucinex as well.   - benzonatate (TESSALON PERLES) 100 MG capsule; Take 1 capsule (100 mg total) by mouth 3 (three) times daily as needed for up to 7 days for cough.  Dispense: 21 capsule; Refill: 0  Return if symptoms worsen or fail to improve.  The entirety of the information documented in the History of Present Illness, Review of Systems and Physical Exam were personally obtained by me. Portions of this information were initially documented by Ashley Royalty, CMA and reviewed by me for thoroughness and accuracy.

## 2017-05-23 ENCOUNTER — Telehealth: Payer: Self-pay | Admitting: Physician Assistant

## 2017-05-23 DIAGNOSIS — J019 Acute sinusitis, unspecified: Secondary | ICD-10-CM

## 2017-05-23 NOTE — Telephone Encounter (Signed)
Cough will likely linger for several weeks, would she like me to send in another week of tessalon perles?

## 2017-05-23 NOTE — Telephone Encounter (Signed)
Advised patient as below. Patient reports that she still has some tessalon Perles left she takes about 3 a day. She has also been using the Mucinex as recommended. She states that she is currently blowing out and coughing up greenish brown mucus. Denies any fevers. She is requesting something different to help with her cough.

## 2017-05-23 NOTE — Telephone Encounter (Signed)
Pt states she was seen last week cough and congestion.  Pt states the congestion is better but the cough is not better.  Please advise.  [facility].  CV#[phone]/[id number]

## 2017-05-23 NOTE — Telephone Encounter (Signed)
Pt called back wanting to know what she can take for her cough.  She has been using the tessalon pills but they are not helping'  She uses [facility]  Her call back is [phone]  Thanks teri

## 2017-05-24 MED ORDER — AMOXICILLIN-POT CLAVULANATE 875-125 MG PO TABS: 1.0000 | ORAL_TABLET | Freq: Two times a day (BID) | ORAL | 0 refills | Status: AC

## 2017-05-24 NOTE — Telephone Encounter (Signed)
Sent!

## 2017-05-24 NOTE — Addendum Note (Signed)
Addended by: Trinna Post on: 05/24/2017 12:38 PM   Modules accepted: Orders

## 2017-05-24 NOTE — Telephone Encounter (Signed)
Pt advised. [facility]

## 2017-05-24 NOTE — Telephone Encounter (Addendum)
Sounds like she might have sinus infection. I will send in some Augmentin for her, one pill twice daily for ten days.

## 2017-05-26 ENCOUNTER — Other Ambulatory Visit: Payer: Self-pay | Admitting: Physician Assistant

## 2017-05-26 DIAGNOSIS — F339 Major depressive disorder, recurrent, unspecified: Secondary | ICD-10-CM

## 2017-05-30 ENCOUNTER — Ambulatory Visit: Payer: BLUE CROSS/BLUE SHIELD | Admitting: Physician Assistant

## 2017-05-30 ENCOUNTER — Encounter: Payer: Self-pay | Admitting: Physician Assistant

## 2017-05-30 VITALS — BP 112/80 | HR 87 | Temp 97.8°F | Resp 16 | Wt 163.8 lb

## 2017-05-30 DIAGNOSIS — J069 Acute upper respiratory infection, unspecified: Secondary | ICD-10-CM

## 2017-05-30 MED ORDER — HYDROCODONE-HOMATROPINE 5-1.5 MG/5ML PO SYRP: 5.0000 mL | ORAL_SOLUTION | Freq: Three times a day (TID) | ORAL | 0 refills | Status: DC | PRN

## 2017-05-30 MED ORDER — PREDNISONE 10 MG PO TABS: ORAL_TABLET | ORAL | 0 refills | Status: DC

## 2017-05-30 NOTE — Patient Instructions (Signed)
Cough, Adult  Coughing is a reflex that clears your throat and your airways. Coughing helps to heal and protect your lungs. It is normal to cough occasionally, but a cough that happens with other symptoms or lasts a long time may be a sign of a condition that needs treatment. A cough may last only 2-3 weeks (acute), or it may last longer than 8 weeks (chronic).  What are the causes?  Coughing is commonly caused by:   Breathing in substances that irritate your lungs.   A viral or bacterial respiratory infection.   Allergies.   Asthma.   Postnasal drip.   Smoking.   Acid backing up from the stomach into the esophagus (gastroesophageal reflux).   Certain medicines.   Chronic lung problems, including COPD (or rarely, lung cancer).   Other medical conditions such as heart failure.    Follow these instructions at home:  Pay attention to any changes in your symptoms. Take these actions to help with your discomfort:   Take medicines only as told by your health care provider.  ? If you were prescribed an antibiotic medicine, take it as told by your health care provider. Do not stop taking the antibiotic even if you start to feel better.  ? Talk with your health care provider before you take a cough suppressant medicine.   Drink enough fluid to keep your urine clear or pale yellow.   If the air is dry, use a cold steam vaporizer or humidifier in your bedroom or your home to help loosen secretions.   Avoid anything that causes you to cough at work or at home.   If your cough is worse at night, try sleeping in a semi-upright position.   Avoid cigarette smoke. If you smoke, quit smoking. If you need help quitting, ask your health care provider.   Avoid caffeine.   Avoid alcohol.   Rest as needed.    Contact a health care provider if:   You have new symptoms.   You cough up pus.   Your cough does not get better after 2-3 weeks, or your cough gets worse.   You cannot control your cough with suppressant  medicines and you are losing sleep.   You develop pain that is getting worse or pain that is not controlled with pain medicines.   You have a fever.   You have unexplained weight loss.   You have night sweats.  Get help right away if:   You cough up blood.   You have difficulty breathing.   Your heartbeat is very fast.  This information is not intended to replace advice given to you by your health care provider. Make sure you discuss any questions you have with your health care provider.  Document Released: 11/20/2010 Document Revised: 10/30/2015 Document Reviewed: 07/31/2014  Elsevier Interactive Patient Education  2018 [employer]vier Inc[employer]

## 2017-05-30 NOTE — Progress Notes (Signed)
Patient: Lori [name] Female    DOB: 1976/02/16   41 y.o.   MRN: [id number] Visit Date: 05/30/2017  Today's Provider: Mar Daring, PA-C   Chief Complaint  Patient presents with  . Cough   Subjective:    Cough  This is a new problem. The current episode started 1 to 4 weeks ago (2 weeks). The problem has been gradually worsening. The problem occurs constantly. The cough is non-productive. Associated symptoms include headaches, postnasal drip, a sore throat and shortness of breath. Pertinent negatives include no chest pain, ear congestion, ear pain, fever, rhinorrhea or wheezing. The symptoms are aggravated by other (laughing). She has tried prescription cough suppressant and OTC cough suppressant (Still taking the Augmentin that she was prescribed ) for the symptoms. The treatment provided no relief.   Was seen on 05/18/17 and diagnosed with Viral URI. Called back on 05/23/17 with no improvement in symptoms and started on Augmentin. Reports symptoms have improved some but cough is still "really bad."    Allergies  Allergen Reactions  . Dapagliflozin Other (See Comments)    Wilder Glade) UTI  . Liraglutide Nausea Only    Victoza     Current Outpatient Medications:  .  amoxicillin-clavulanate (AUGMENTIN) 875-125 MG tablet, Take 1 tablet by mouth 2 (two) times daily for 10 days., Disp: 20 tablet, Rfl: 0 .  atorvastatin (LIPITOR) 20 MG tablet, Take 20 mg by mouth at bedtime., Disp: , Rfl:  .  esomeprazole (NEXIUM) 40 MG capsule, Take 1 capsule (40 mg total) by mouth daily., Disp: 90 capsule, Rfl: 3 .  KOMBIGLYZE XR 2.10-998 MG TB24, TAKE 1 TABLETS BY MOUTH ONCE DAILY. TAKE WITH EVENING MEAL., Disp: , Rfl: 6 .  levothyroxine (SYNTHROID, LEVOTHROID) 112 MCG tablet, TAKE 1 TABLET EVERY DAY ON AN EMPTY STOMACH WITH A GLASS OF WATER AT LEAST 30-60 MINS BEFORE BFAST, Disp: , Rfl: 3 .  lisinopril (PRINIVIL,ZESTRIL) 5 MG tablet, Take 5 mg by mouth at bedtime. , Disp: , Rfl:  .   metoprolol succinate (TOPROL-XL) 25 MG 24 hr tablet, Take 37.5 mg by mouth daily. , Disp: , Rfl:  .  NOVOLOG MIX 70/30 FLEXPEN (70-30) 100 UNIT/ML FlexPen, Inject 50 Units into the skin 2 (two) times daily. , Disp: , Rfl: 0 .  sertraline (ZOLOFT) 50 MG tablet, TAKE 1 TABLET (50 MG TOTAL) BY MOUTH AT BEDTIME., Disp: 90 tablet, Rfl: 3  Review of Systems  Constitutional: Negative for fever.  HENT: Positive for congestion, postnasal drip and sore throat. Negative for ear pain and rhinorrhea.   Respiratory: Positive for cough, chest tightness and shortness of breath. Negative for wheezing.   Cardiovascular: Negative for chest pain, palpitations and leg swelling.  Neurological: Positive for headaches. Negative for dizziness.    Social History   Tobacco Use  . Smoking status: Current Some Day Smoker    Packs/day: 0.25    Years: 10.00    Pack years: 2.50    Types: Cigarettes    Last attempt to quit: 03/10/2016    Years since quitting: 1.2  . Smokeless tobacco: Never Used  Substance Use Topics  . Alcohol use: Yes    Alcohol/week: 1.2 oz    Types: 2 Glasses of wine per week    Comment: occasional   Objective:   BP 112/80 (BP Location: Left Arm, Patient Position: Sitting, Cuff Size: Normal)   Pulse 87   Temp 97.8 F (36.6 C) (Oral)   Resp 16  Wt 163 lb 12.8 oz (74.3 kg)   LMP 05/11/2017   SpO2 97%   BMI 26.44 kg/m  Vitals:   05/30/17 0859  BP: 112/80  Pulse: 87  Resp: 16  Temp: 97.8 F (36.6 C)  TempSrc: Oral  SpO2: 97%  Weight: 163 lb 12.8 oz (74.3 kg)     Physical Exam  Constitutional: She appears well-developed and well-nourished. No distress.  HENT:  Head: Normocephalic and atraumatic.  Right Ear: Hearing, tympanic membrane, external ear and ear canal normal.  Left Ear: Hearing, tympanic membrane, external ear and ear canal normal.  Nose: Nose normal.  Mouth/Throat: Uvula is midline, oropharynx is clear and moist and mucous membranes are normal. No oropharyngeal  exudate.  Eyes: Conjunctivae are normal. Pupils are equal, round, and reactive to light. Right eye exhibits no discharge. Left eye exhibits no discharge. No scleral icterus.  Neck: Normal range of motion. Neck supple. No tracheal deviation present. No thyromegaly present.  Cardiovascular: Normal rate, regular rhythm and normal heart sounds. Exam reveals no gallop and no friction rub.  No murmur heard. Pulmonary/Chest: Effort normal and breath sounds normal. No stridor. No respiratory distress. She has no wheezes. She has no rales.  Lymphadenopathy:    She has no cervical adenopathy.  Skin: Skin is warm and dry. She is not diaphoretic.  Vitals reviewed.       Assessment & Plan:     1. URI with cough and congestion Will add prednisone to augmentin she is already taking. Hycodan cough syrup given for nighttime cough. Mucinex DM for daytime cough and congestion. Push fluids.  - predniSONE (DELTASONE) 10 MG tablet; Take 6 tabs PO on day 1&2, 5 tabs PO on day 3&4, 4 tabs PO on day 5&6, 3 tabs PO on day 7&8, 2 tabs PO on day 9&10, 1 tab PO on day 11&12.  Dispense: 42 tablet; Refill: 0 - HYDROcodone-homatropine (HYCODAN) 5-1.5 MG/5ML syrup; Take 5 mLs by mouth every 8 (eight) hours as needed for cough.  Dispense: 120 mL; Refill: 0       Mar Daring, PA-C  [facility]

## 2017-06-08 ENCOUNTER — Telehealth: Payer: Self-pay | Admitting: Physician Assistant

## 2017-06-08 DIAGNOSIS — J069 Acute upper respiratory infection, unspecified: Secondary | ICD-10-CM

## 2017-06-08 MED ORDER — PREDNISONE 10 MG PO TABS: ORAL_TABLET | ORAL | 0 refills | Status: DC

## 2017-06-08 NOTE — Telephone Encounter (Signed)
Can we see if patient has productive cough or dry. If dry it can be post infective cough from the inflammation.   Will send in more prednisone.

## 2017-06-08 NOTE — Telephone Encounter (Signed)
Patient states that cough is slightly productive, but still mostly dry. Patient states cough sounds more like a bark now. She is out of cough medication and will be finished with prednisone tomorrow. Patient would like a call back on her cell [phone].

## 2017-06-08 NOTE — Telephone Encounter (Signed)
Almost done with antibiotic, Prednisone will be done today or tomorrow. Took all of her cough medicine and she is still coughing really bad.   She has been on Mucinex at least 2 weeks if not longer.     [facility]

## 2017-06-09 MED ORDER — PSEUDOEPH-BROMPHEN-DM 30-2-10 MG/5ML PO SYRP: 5.0000 mL | ORAL_SOLUTION | Freq: Three times a day (TID) | ORAL | 0 refills | Status: DC | PRN

## 2017-06-09 NOTE — Telephone Encounter (Signed)
Sent in bromfed to see if this helps better

## 2017-06-09 NOTE — Telephone Encounter (Signed)
Patient advised as directed below.  Thanks,  -Joseline 

## 2017-06-09 NOTE — Telephone Encounter (Signed)
I did send in new Rx of prednisone for patient to try. Add Mucinex DM for cough and congestion

## 2017-06-09 NOTE — Telephone Encounter (Signed)
Patient advised as directed below. Per patient she reports that she was already taking the Mucinex DM and was hoping for you to give her something else for the cough "like a prescription for cough Syrup" or something. She wants me to ask if you recommend something different or if possible prescribed her some cough syrup. Please Advise.  Thanks,  -Kree Rafter

## 2017-06-30 ENCOUNTER — Other Ambulatory Visit: Payer: Self-pay | Admitting: Obstetrics and Gynecology

## 2017-06-30 DIAGNOSIS — B9689 Other specified bacterial agents as the cause of diseases classified elsewhere: Secondary | ICD-10-CM

## 2017-06-30 DIAGNOSIS — N760 Acute vaginitis: Secondary | ICD-10-CM

## 2017-07-11 DIAGNOSIS — H2513 Age-related nuclear cataract, bilateral: Secondary | ICD-10-CM | POA: Diagnosis not present

## 2017-07-11 DIAGNOSIS — E109 Type 1 diabetes mellitus without complications: Secondary | ICD-10-CM | POA: Diagnosis not present

## 2017-07-11 DIAGNOSIS — H43813 Vitreous degeneration, bilateral: Secondary | ICD-10-CM | POA: Diagnosis not present

## 2017-07-28 DIAGNOSIS — E039 Hypothyroidism, unspecified: Secondary | ICD-10-CM | POA: Diagnosis not present

## 2017-07-28 DIAGNOSIS — E109 Type 1 diabetes mellitus without complications: Secondary | ICD-10-CM | POA: Diagnosis not present

## 2017-07-28 DIAGNOSIS — I10 Essential (primary) hypertension: Secondary | ICD-10-CM | POA: Diagnosis not present

## 2017-07-29 ENCOUNTER — Ambulatory Visit: Payer: BLUE CROSS/BLUE SHIELD | Admitting: Physician Assistant

## 2017-07-29 ENCOUNTER — Encounter: Payer: Self-pay | Admitting: Physician Assistant

## 2017-07-29 VITALS — BP 102/70 | HR 87 | Resp 16 | Wt 164.6 lb

## 2017-07-29 DIAGNOSIS — M62830 Muscle spasm of back: Secondary | ICD-10-CM

## 2017-07-29 DIAGNOSIS — G5601 Carpal tunnel syndrome, right upper limb: Secondary | ICD-10-CM | POA: Diagnosis not present

## 2017-07-29 MED ORDER — METAXALONE 800 MG PO TABS: 400.0000 mg | ORAL_TABLET | Freq: Three times a day (TID) | ORAL | 0 refills | Status: DC

## 2017-07-29 NOTE — Progress Notes (Signed)
Patient: Lori [name] Female    DOB: 09-Dec-1975   42 y.o.   MRN: [id number] Visit Date: 07/29/2017  Today's Provider: Mar Daring, PA-C   Chief Complaint  Patient presents with  . Back Pain   Subjective:    Back Pain  This is a new problem. The current episode started in the past 7 days. The problem occurs constantly. The problem has been gradually worsening since onset. Pain location: Right side of her back. The quality of the pain is described as aching, shooting and stabbing. The pain does not radiate. The pain is moderate. The pain is the same all the time. The symptoms are aggravated by sitting. Associated symptoms include numbness (right hand). Pertinent negatives include no abdominal pain, bladder incontinence, chest pain, dysuria, fever, leg pain, pelvic pain, perianal numbness, tingling or weakness. She has tried ice and NSAIDs (Aspirin for back and body ache) for the symptoms.   Patient has been doing more stuff recently. She has an 33 month old puppy and has been having to clean and mop after the puppy. 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  She is also having complaints of numbness and tingling in the thumb, index and middle fingers of the right hand. This has been going on for a few months now. She has been taking Motrin for this without relief. She does have to type a lot at work.     Allergies  Allergen Reactions  . Dapagliflozin Other (See Comments)    Wilder Glade) UTI  . Liraglutide Nausea Only    Victoza     Current Outpatient Medications:  .  atorvastatin (LIPITOR) 20 MG tablet, Take 20 mg by mouth at bedtime., Disp: , Rfl:  .  esomeprazole (NEXIUM) 40 MG capsule, Take 1 capsule (40 mg total) by mouth daily., Disp: 90 capsule, Rfl: 3 .  levothyroxine (SYNTHROID, LEVOTHROID) 112 MCG tablet, TAKE 1 TABLET EVERY DAY ON AN EMPTY STOMACH WITH A GLASS OF WATER AT LEAST 30-60 MINS  BEFORE BFAST, Disp: , Rfl: 3 .  lisinopril (PRINIVIL,ZESTRIL) 5 MG tablet, Take 5 mg by mouth at bedtime. , Disp: , Rfl:  .  metoprolol succinate (TOPROL-XL) 25 MG 24 hr tablet, Take 37.5 mg by mouth daily. , Disp: , Rfl:  .  NOVOLOG MIX 70/30 FLEXPEN (70-30) 100 UNIT/ML FlexPen, Inject 50 Units into the skin 2 (two) times daily. , Disp: , Rfl: 0 .  sertraline (ZOLOFT) 50 MG tablet, TAKE 1 TABLET (50 MG TOTAL) BY MOUTH AT BEDTIME., Disp: 90 tablet, Rfl: 3 .  KOMBIGLYZE XR 2.10-998 MG TB24, TAKE 1 TABLETS BY MOUTH ONCE DAILY. TAKE WITH EVENING MEAL., Disp: , Rfl: 6  Review of Systems  Constitutional: Negative for fever.  Cardiovascular: Negative for chest pain, palpitations and leg swelling.  Gastrointestinal: Negative for abdominal pain.  Genitourinary: Negative for bladder incontinence, dysuria, frequency, pelvic pain and urgency.  Musculoskeletal: Positive for back pain.  Neurological: Positive for numbness (right hand). Negative for tingling and weakness.    Social History   Tobacco Use  . Smoking status: Current Some Day Smoker    Packs/day: 0.25    Years: 10.00    Pack years: 2.50    Types: Cigarettes    Last attempt to quit: 03/10/2016    Years since quitting: 1.3  . Smokeless tobacco: Never Used  Substance Use Topics  . Alcohol  use: Yes    Alcohol/week: 1.2 oz    Types: 2 Glasses of wine per week    Comment: occasional   Objective:   BP 102/70 (BP Location: Left Arm, Patient Position: Sitting, Cuff Size: Normal)   Pulse 87   Resp 16   Wt 164 lb 9.6 oz (74.7 kg)   SpO2 96%   BMI 26.57 kg/m    Physical Exam  Constitutional: She appears well-developed and well-nourished. No distress.  Neck: Normal range of motion. Neck supple. No JVD present. No tracheal deviation present. No thyromegaly present.  Cardiovascular: Normal rate, regular rhythm and normal heart sounds. Exam reveals no gallop and no friction rub.  No murmur heard. Pulmonary/Chest: Effort normal and  breath sounds normal. No respiratory distress. She has no wheezes. She has no rales.  Musculoskeletal:       Lumbar back: She exhibits tenderness (right muscle group) and spasm (right paraspinals and latissimus dorsi). She exhibits normal range of motion and no bony tenderness.       Right hand: Normal. Normal sensation noted. Normal strength noted.  Positive Tinel sign and Phalen test on right  Negative SLR, LE strength 5/5  Lymphadenopathy:    She has no cervical adenopathy.  Skin: She is not diaphoretic.  Vitals reviewed.       Assessment & Plan:     1. Muscle spasm of back Skelaxin given as below. Continue alternating tylenol and IBU prn. Heating pad prn. Epsom salt soaks. May use tumeric for inflammation.  - metaxalone (SKELAXIN) 800 MG tablet; Take 0.5-1 tablets (400-800 mg total) by mouth 3 (three) times daily.  Dispense: 30 tablet; Refill: 0  2. Carpal tunnel syndrome of right wrist Advised to get cock-up wrist brace and try at night currently. May then add to daytime use if needed. She is to call if symptoms worsen. Trying to avoid steroids due to diabetes.        Mar Daring, PA-C  [facility]

## 2017-08-16 ENCOUNTER — Telehealth: Payer: Self-pay | Admitting: Physician Assistant

## 2017-08-16 DIAGNOSIS — G5601 Carpal tunnel syndrome, right upper limb: Secondary | ICD-10-CM

## 2017-08-16 NOTE — Telephone Encounter (Signed)
Please advise.  Thanks,  -Joseline 

## 2017-08-16 NOTE — Telephone Encounter (Signed)
Pt states her right wrist is still bothering her.  Sates brace has not helped and pt is wanting to know what else she can do.

## 2017-08-16 NOTE — Telephone Encounter (Signed)
Can refer her to Dr. Peggye Ley, hand surgeon for further evaluation and other options.

## 2017-08-16 NOTE — Telephone Encounter (Signed)
LMTCB  Thanks,  -Amie Cowens 

## 2017-08-17 NOTE — Telephone Encounter (Signed)
Referral placed.

## 2017-08-17 NOTE — Telephone Encounter (Signed)
Patient is returning phone call to [place].  Please call her at work #. She is there until 5:00 today.

## 2017-08-17 NOTE — Telephone Encounter (Signed)
Patient agree to the referral to Dr.Soria

## 2017-08-19 ENCOUNTER — Telehealth: Payer: Self-pay

## 2017-08-19 DIAGNOSIS — F411 Generalized anxiety disorder: Secondary | ICD-10-CM

## 2017-08-19 NOTE — Telephone Encounter (Signed)
Pharmacy sent a fax requesting a refill on alprazolam. The mg or sig is not listed. Please review. Hard copy on your desk. Thanks!

## 2017-08-22 ENCOUNTER — Other Ambulatory Visit: Payer: Self-pay | Admitting: Physician Assistant

## 2017-08-22 DIAGNOSIS — F339 Major depressive disorder, recurrent, unspecified: Secondary | ICD-10-CM

## 2017-08-22 MED ORDER — SERTRALINE HCL 50 MG PO TABS: 50.0000 mg | ORAL_TABLET | Freq: Every day | ORAL | 3 refills | Status: DC

## 2017-08-22 NOTE — Telephone Encounter (Signed)
Please review. Thanks!  

## 2017-08-22 NOTE — Telephone Encounter (Signed)
[facility] 90-days supply for the following medication. Thanks CC  sertraline (ZOLOFT) 50 MG tablet

## 2017-08-23 MED ORDER — ALPRAZOLAM 0.5 MG PO TABS: 0.5000 mg | ORAL_TABLET | Freq: Every evening | ORAL | 1 refills | Status: DC | PRN

## 2017-08-23 NOTE — Telephone Encounter (Signed)
Sent in

## 2017-08-25 DIAGNOSIS — G5601 Carpal tunnel syndrome, right upper limb: Secondary | ICD-10-CM | POA: Diagnosis not present

## 2017-09-12 DIAGNOSIS — E119 Type 2 diabetes mellitus without complications: Secondary | ICD-10-CM | POA: Diagnosis not present

## 2017-09-12 DIAGNOSIS — Z794 Long term (current) use of insulin: Secondary | ICD-10-CM | POA: Diagnosis not present

## 2017-09-12 DIAGNOSIS — Z87891 Personal history of nicotine dependence: Secondary | ICD-10-CM | POA: Diagnosis not present

## 2017-09-12 DIAGNOSIS — E890 Postprocedural hypothyroidism: Secondary | ICD-10-CM | POA: Diagnosis not present

## 2017-09-22 DIAGNOSIS — I788 Other diseases of capillaries: Secondary | ICD-10-CM | POA: Diagnosis not present

## 2017-09-22 DIAGNOSIS — L821 Other seborrheic keratosis: Secondary | ICD-10-CM | POA: Diagnosis not present

## 2017-09-22 DIAGNOSIS — L812 Freckles: Secondary | ICD-10-CM | POA: Diagnosis not present

## 2017-09-22 DIAGNOSIS — D229 Melanocytic nevi, unspecified: Secondary | ICD-10-CM | POA: Diagnosis not present

## 2017-09-25 DIAGNOSIS — Z87891 Personal history of nicotine dependence: Secondary | ICD-10-CM | POA: Insufficient documentation

## 2017-10-19 DIAGNOSIS — L821 Other seborrheic keratosis: Secondary | ICD-10-CM | POA: Diagnosis not present

## 2017-10-19 DIAGNOSIS — L299 Pruritus, unspecified: Secondary | ICD-10-CM | POA: Diagnosis not present

## 2017-10-19 DIAGNOSIS — L820 Inflamed seborrheic keratosis: Secondary | ICD-10-CM | POA: Diagnosis not present

## 2017-12-26 DIAGNOSIS — E119 Type 2 diabetes mellitus without complications: Secondary | ICD-10-CM | POA: Diagnosis not present

## 2017-12-26 DIAGNOSIS — Z794 Long term (current) use of insulin: Secondary | ICD-10-CM | POA: Diagnosis not present

## 2017-12-26 DIAGNOSIS — E890 Postprocedural hypothyroidism: Secondary | ICD-10-CM | POA: Diagnosis not present

## 2017-12-29 DIAGNOSIS — E119 Type 2 diabetes mellitus without complications: Secondary | ICD-10-CM | POA: Diagnosis not present

## 2017-12-29 DIAGNOSIS — E039 Hypothyroidism, unspecified: Secondary | ICD-10-CM | POA: Diagnosis not present

## 2017-12-29 DIAGNOSIS — Z87891 Personal history of nicotine dependence: Secondary | ICD-10-CM | POA: Diagnosis not present

## 2017-12-29 DIAGNOSIS — E782 Mixed hyperlipidemia: Secondary | ICD-10-CM | POA: Diagnosis not present

## 2018-01-04 DIAGNOSIS — I5020 Unspecified systolic (congestive) heart failure: Secondary | ICD-10-CM | POA: Diagnosis not present

## 2018-01-04 DIAGNOSIS — J984 Other disorders of lung: Secondary | ICD-10-CM | POA: Diagnosis not present

## 2018-01-04 DIAGNOSIS — E119 Type 2 diabetes mellitus without complications: Secondary | ICD-10-CM | POA: Diagnosis not present

## 2018-01-04 DIAGNOSIS — E039 Hypothyroidism, unspecified: Secondary | ICD-10-CM | POA: Diagnosis not present

## 2018-01-04 DIAGNOSIS — C8110 Nodular sclerosis classical Hodgkin lymphoma, unspecified site: Secondary | ICD-10-CM | POA: Diagnosis not present

## 2018-01-04 DIAGNOSIS — Z9081 Acquired absence of spleen: Secondary | ICD-10-CM | POA: Diagnosis not present

## 2018-01-04 DIAGNOSIS — I059 Rheumatic mitral valve disease, unspecified: Secondary | ICD-10-CM | POA: Diagnosis not present

## 2018-01-04 DIAGNOSIS — I427 Cardiomyopathy due to drug and external agent: Secondary | ICD-10-CM | POA: Diagnosis not present

## 2018-01-04 DIAGNOSIS — T66XXXS Radiation sickness, unspecified, sequela: Secondary | ICD-10-CM | POA: Insufficient documentation

## 2018-01-04 DIAGNOSIS — Z87891 Personal history of nicotine dependence: Secondary | ICD-10-CM | POA: Diagnosis not present

## 2018-01-04 DIAGNOSIS — I5022 Chronic systolic (congestive) heart failure: Secondary | ICD-10-CM | POA: Diagnosis not present

## 2018-01-09 ENCOUNTER — Ambulatory Visit: Payer: BLUE CROSS/BLUE SHIELD | Admitting: Physician Assistant

## 2018-01-09 ENCOUNTER — Encounter: Payer: Self-pay | Admitting: Physician Assistant

## 2018-01-09 VITALS — BP 108/60 | HR 64 | Temp 98.2°F | Resp 16 | Wt 160.8 lb

## 2018-01-09 DIAGNOSIS — M62830 Muscle spasm of back: Secondary | ICD-10-CM

## 2018-01-09 DIAGNOSIS — M545 Low back pain, unspecified: Secondary | ICD-10-CM

## 2018-01-09 MED ORDER — METAXALONE 800 MG PO TABS: 400.0000 mg | ORAL_TABLET | Freq: Three times a day (TID) | ORAL | 0 refills | Status: DC

## 2018-01-09 MED ORDER — MELOXICAM 15 MG PO TABS: 15.0000 mg | ORAL_TABLET | Freq: Every day | ORAL | 0 refills | Status: DC

## 2018-01-09 NOTE — Patient Instructions (Signed)

## 2018-01-09 NOTE — Progress Notes (Signed)
Patient: Lori [name] Female    DOB: [birth year]   42 y.o.   MRN: [id number] Visit Date: 01/09/2018  Today's Provider: Mar Daring, PA-C   Chief Complaint  Patient presents with  . Back Pain   Subjective:    Back Pain  This is a new problem. The current episode started 1 to 4 weeks ago. The problem occurs daily. The problem has been gradually worsening since onset. Pain location: Right side lower back. The quality of the pain is described as aching and burning. The pain radiates to the right thigh. The pain is at a severity of 7/10. The pain is moderate. The pain is the same all the time. The symptoms are aggravated by bending, sitting and position. Pertinent negatives include no abdominal pain, bladder incontinence, dysuria, fever, leg pain, numbness or tingling. She has tried NSAIDs, heat and ice for the symptoms. The treatment provided mild relief.  She was helping a girlfriend move this weekend and feels this is where she aggravated her back. It is in the same area she always has pain after a lot of activity or lifting. She reports yesterday was the worst day. Today has not been great, but not as bad as yesterday.     Allergies  Allergen Reactions  . Dapagliflozin Other (See Comments)    Wilder Glade) UTI  . Liraglutide Nausea Only    Victoza     Current Outpatient Medications:  .  ALPRAZolam (XANAX) 0.5 MG tablet, Take 1 tablet (0.5 mg total) by mouth at bedtime as needed for anxiety. And 1/2 tab PO during the day for panic attacks, Disp: 45 tablet, Rfl: 1 .  atorvastatin (LIPITOR) 20 MG tablet, Take 20 mg by mouth at bedtime., Disp: , Rfl:  .  empagliflozin (JARDIANCE) 25 MG TABS tablet, Take by mouth., Disp: , Rfl:  .  esomeprazole (NEXIUM) 40 MG capsule, Take 1 capsule (40 mg total) by mouth daily., Disp: 90 capsule, Rfl: 3 .  levothyroxine (SYNTHROID, LEVOTHROID) 112 MCG tablet, TAKE 1 TABLET EVERY DAY ON AN EMPTY STOMACH WITH A GLASS OF WATER AT LEAST 30-60 MINS  BEFORE BFAST, Disp: , Rfl: 3 .  lisinopril (PRINIVIL,ZESTRIL) 5 MG tablet, Take 5 mg by mouth at bedtime. , Disp: , Rfl:  .  metaxalone (SKELAXIN) 800 MG tablet, Take 0.5-1 tablets (400-800 mg total) by mouth 3 (three) times daily., Disp: 30 tablet, Rfl: 0 .  metFORMIN (GLUCOPHAGE-XR) 500 MG 24 hr tablet, Take by mouth., Disp: , Rfl:  .  metoprolol succinate (TOPROL-XL) 25 MG 24 hr tablet, Take 37.5 mg by mouth daily. , Disp: , Rfl:  .  NOVOLOG MIX 70/30 FLEXPEN (70-30) 100 UNIT/ML FlexPen, Inject 50 Units into the skin 2 (two) times daily. , Disp: , Rfl: 0 .  sertraline (ZOLOFT) 50 MG tablet, Take 1 tablet (50 mg total) by mouth at bedtime., Disp: 90 tablet, Rfl: 3  Review of Systems  Constitutional: Negative for fever.  Gastrointestinal: Negative for abdominal pain.  Genitourinary: Negative for bladder incontinence and dysuria.  Musculoskeletal: Positive for back pain.  Neurological: Negative for tingling and numbness.    Social History   Tobacco Use  . Smoking status: Current Some Day Smoker    Packs/day: 0.25    Years: 10.00    Pack years: 2.50    Types: Cigarettes    Last attempt to quit: 03/10/2016    Years since quitting: 1.8  . Smokeless tobacco: Never Used  Substance Use  Topics  . Alcohol use: Yes    Alcohol/week: 1.2 oz    Types: 2 Glasses of wine per week    Comment: occasional   Objective:   BP 108/60 (BP Location: Left Arm, Patient Position: Sitting, Cuff Size: Normal)   Pulse 64   Temp 98.2 F (36.8 C) (Oral)   Resp 16   Wt 160 lb 12.8 oz (72.9 kg)   SpO2 99%   BMI 25.95 kg/m  Vitals:   01/09/18 1448  BP: 108/60  Pulse: 64  Resp: 16  Temp: 98.2 F (36.8 C)  TempSrc: Oral  SpO2: 99%  Weight: 160 lb 12.8 oz (72.9 kg)     Physical Exam  Constitutional: She appears well-developed and well-nourished. No distress.  Neck: Normal range of motion. Neck supple. No JVD present. No tracheal deviation present. No thyromegaly present.  Cardiovascular: Normal  rate, regular rhythm and normal heart sounds. Exam reveals no gallop and no friction rub.  No murmur heard. Pulmonary/Chest: Effort normal and breath sounds normal. No respiratory distress. She has no wheezes. She has no rales.  Musculoskeletal:       Thoracic back: She exhibits spasm. She exhibits no tenderness and no bony tenderness.       Lumbar back: She exhibits no tenderness, no bony tenderness and no spasm.       Back:  Lymphadenopathy:    She has no cervical adenopathy.  Skin: She is not diaphoretic.  Vitals reviewed.       Assessment & Plan:     1. Acute right-sided low back pain without sciatica Will treat with meloxicam and metaxalone as below. May use tylenol prn throughout the day. Continue ice and heat. Back exercises printed for patient. Call if symptoms worsen or no improvement in 10-14 days.  - meloxicam (MOBIC) 15 MG tablet; Take 1 tablet (15 mg total) by mouth daily.  Dispense: 30 tablet; Refill: 0  2. Muscle spasm of back See above medical treatment plan. - metaxalone (SKELAXIN) 800 MG tablet; Take 0.5-1 tablets (400-800 mg total) by mouth 3 (three) times daily.  Dispense: 30 tablet; Refill: 0 - meloxicam (MOBIC) 15 MG tablet; Take 1 tablet (15 mg total) by mouth daily.  Dispense: 30 tablet; Refill: 0       Mar Daring, PA-C  [facility]

## 2018-02-02 ENCOUNTER — Other Ambulatory Visit: Payer: Self-pay | Admitting: Physician Assistant

## 2018-02-02 DIAGNOSIS — F411 Generalized anxiety disorder: Secondary | ICD-10-CM

## 2018-02-05 ENCOUNTER — Other Ambulatory Visit: Payer: Self-pay | Admitting: Physician Assistant

## 2018-02-05 DIAGNOSIS — M545 Low back pain, unspecified: Secondary | ICD-10-CM

## 2018-02-05 DIAGNOSIS — M62830 Muscle spasm of back: Secondary | ICD-10-CM

## 2018-02-18 ENCOUNTER — Other Ambulatory Visit: Payer: Self-pay | Admitting: Physician Assistant

## 2018-02-18 DIAGNOSIS — K219 Gastro-esophageal reflux disease without esophagitis: Secondary | ICD-10-CM

## 2018-02-22 ENCOUNTER — Ambulatory Visit: Payer: BLUE CROSS/BLUE SHIELD | Admitting: Obstetrics and Gynecology

## 2018-03-01 DIAGNOSIS — C8110 Nodular sclerosis classical Hodgkin lymphoma, unspecified site: Secondary | ICD-10-CM | POA: Diagnosis not present

## 2018-03-01 DIAGNOSIS — N6311 Unspecified lump in the right breast, upper outer quadrant: Secondary | ICD-10-CM | POA: Diagnosis not present

## 2018-03-01 DIAGNOSIS — T66XXXS Radiation sickness, unspecified, sequela: Secondary | ICD-10-CM | POA: Diagnosis not present

## 2018-03-01 DIAGNOSIS — Z1231 Encounter for screening mammogram for malignant neoplasm of breast: Secondary | ICD-10-CM | POA: Diagnosis not present

## 2018-03-07 DIAGNOSIS — K6289 Other specified diseases of anus and rectum: Secondary | ICD-10-CM | POA: Diagnosis not present

## 2018-03-07 DIAGNOSIS — Z79899 Other long term (current) drug therapy: Secondary | ICD-10-CM | POA: Diagnosis not present

## 2018-03-07 DIAGNOSIS — F329 Major depressive disorder, single episode, unspecified: Secondary | ICD-10-CM | POA: Diagnosis not present

## 2018-03-07 DIAGNOSIS — Z7984 Long term (current) use of oral hypoglycemic drugs: Secondary | ICD-10-CM | POA: Diagnosis not present

## 2018-03-07 DIAGNOSIS — I429 Cardiomyopathy, unspecified: Secondary | ICD-10-CM | POA: Diagnosis not present

## 2018-03-07 DIAGNOSIS — I509 Heart failure, unspecified: Secondary | ICD-10-CM | POA: Diagnosis not present

## 2018-03-07 DIAGNOSIS — Z8571 Personal history of Hodgkin lymphoma: Secondary | ICD-10-CM | POA: Diagnosis not present

## 2018-03-07 DIAGNOSIS — K635 Polyp of colon: Secondary | ICD-10-CM | POA: Diagnosis not present

## 2018-03-07 DIAGNOSIS — K621 Rectal polyp: Secondary | ICD-10-CM | POA: Diagnosis not present

## 2018-03-07 DIAGNOSIS — D128 Benign neoplasm of rectum: Secondary | ICD-10-CM | POA: Diagnosis not present

## 2018-03-07 DIAGNOSIS — K388 Other specified diseases of appendix: Secondary | ICD-10-CM | POA: Diagnosis not present

## 2018-03-07 DIAGNOSIS — E890 Postprocedural hypothyroidism: Secondary | ICD-10-CM | POA: Diagnosis not present

## 2018-03-07 DIAGNOSIS — Z1212 Encounter for screening for malignant neoplasm of rectum: Secondary | ICD-10-CM | POA: Diagnosis not present

## 2018-03-07 DIAGNOSIS — Z923 Personal history of irradiation: Secondary | ICD-10-CM | POA: Diagnosis not present

## 2018-03-07 DIAGNOSIS — E119 Type 2 diabetes mellitus without complications: Secondary | ICD-10-CM | POA: Diagnosis not present

## 2018-03-07 DIAGNOSIS — Z9049 Acquired absence of other specified parts of digestive tract: Secondary | ICD-10-CM | POA: Diagnosis not present

## 2018-03-07 DIAGNOSIS — D122 Benign neoplasm of ascending colon: Secondary | ICD-10-CM | POA: Diagnosis not present

## 2018-03-07 DIAGNOSIS — Z1211 Encounter for screening for malignant neoplasm of colon: Secondary | ICD-10-CM | POA: Diagnosis not present

## 2018-03-07 DIAGNOSIS — Z9081 Acquired absence of spleen: Secondary | ICD-10-CM | POA: Diagnosis not present

## 2018-03-07 DIAGNOSIS — D126 Benign neoplasm of colon, unspecified: Secondary | ICD-10-CM | POA: Diagnosis not present

## 2018-03-08 DIAGNOSIS — Z5181 Encounter for therapeutic drug level monitoring: Secondary | ICD-10-CM | POA: Diagnosis not present

## 2018-03-08 DIAGNOSIS — K91840 Postprocedural hemorrhage and hematoma of a digestive system organ or structure following a digestive system procedure: Secondary | ICD-10-CM | POA: Diagnosis not present

## 2018-03-08 DIAGNOSIS — Z79899 Other long term (current) drug therapy: Secondary | ICD-10-CM | POA: Diagnosis not present

## 2018-03-15 ENCOUNTER — Ambulatory Visit: Payer: BLUE CROSS/BLUE SHIELD | Admitting: Obstetrics and Gynecology

## 2018-03-21 ENCOUNTER — Ambulatory Visit: Payer: BLUE CROSS/BLUE SHIELD | Admitting: Family Medicine

## 2018-03-21 ENCOUNTER — Encounter: Payer: Self-pay | Admitting: Family Medicine

## 2018-03-21 VITALS — BP 118/78 | HR 91 | Temp 98.1°F | Wt 159.0 lb

## 2018-03-21 DIAGNOSIS — J0110 Acute frontal sinusitis, unspecified: Secondary | ICD-10-CM | POA: Diagnosis not present

## 2018-03-21 MED ORDER — AMOXICILLIN-POT CLAVULANATE 875-125 MG PO TABS: 1.0000 | ORAL_TABLET | Freq: Two times a day (BID) | ORAL | 0 refills | Status: DC

## 2018-03-21 NOTE — Patient Instructions (Signed)
Discussed use of Mucinex D for congestion and Delsym for cough. 

## 2018-03-21 NOTE — Progress Notes (Signed)
  Subjective:     Patient ID: Lori [name], female   DOB: 09-19-1975, 42 y.o.   MRN: [id number] Chief Complaint  Patient presents with  . URI    Patient presents today with heachache, cough, congestion and fever that occurred at 4:30 am on 03/21/2018. Patient has also been having body aches. Patient states the symptoms has been occurring for about a 1 week now. Patient has taking Mucinex sinus max and states her symptoms are becoming worse.    HPI Patient reports increased sinus pressure, purulent post nasal drainage and accompanying cough  Review of Systems     Objective:   Physical Exam  Constitutional: She appears well-developed and well-nourished. No distress.  Ears: T.M's intact without inflammation Sinuses: mild frontal sinus tenderness Throat: no tonsillar enlargement or exudate Neck: no cervical adenopathy Lungs: clear     Assessment:    1. Acute frontal sinusitis, recurrence not specified - amoxicillin-clavulanate (AUGMENTIN) 875-125 MG tablet; Take 1 tablet by mouth 2 (two) times daily.  Dispense: 20 tablet; Refill: 0    Plan:    Discussed otc medication.

## 2018-04-03 ENCOUNTER — Other Ambulatory Visit: Payer: Self-pay | Admitting: Family Medicine

## 2018-04-03 ENCOUNTER — Telehealth: Payer: Self-pay | Admitting: Physician Assistant

## 2018-04-03 MED ORDER — LEVOFLOXACIN 750 MG PO TABS: 750.0000 mg | ORAL_TABLET | Freq: Every day | ORAL | 0 refills | Status: DC

## 2018-04-03 NOTE — Telephone Encounter (Signed)
Pt is still having the same sinus symptoms after taking what Lori [name] had prescribed on Oct 15th.  Please advise.   Thanks, [employer]

## 2018-04-03 NOTE — Telephone Encounter (Signed)
Patient reports that she still has a "stuffy head," headache and productive cough. Patient reports that she did take antibiotic and taking otc Mucinex with no relief. KW

## 2018-04-03 NOTE — Telephone Encounter (Signed)
Reports mild improvement on Augmentin but continues to have purulent sinus drainage. Will place on Levaquin 750 x 5 days.

## 2018-04-20 ENCOUNTER — Ambulatory Visit (INDEPENDENT_AMBULATORY_CARE_PROVIDER_SITE_OTHER): Payer: BLUE CROSS/BLUE SHIELD | Admitting: Obstetrics and Gynecology

## 2018-04-20 ENCOUNTER — Encounter: Payer: Self-pay | Admitting: Obstetrics and Gynecology

## 2018-04-20 VITALS — BP 138/70 | HR 85 | Ht 66.0 in | Wt 160.0 lb

## 2018-04-20 DIAGNOSIS — Z30431 Encounter for routine checking of intrauterine contraceptive device: Secondary | ICD-10-CM

## 2018-04-20 DIAGNOSIS — Z01419 Encounter for gynecological examination (general) (routine) without abnormal findings: Secondary | ICD-10-CM

## 2018-04-20 DIAGNOSIS — N760 Acute vaginitis: Secondary | ICD-10-CM

## 2018-04-20 DIAGNOSIS — Z1239 Encounter for other screening for malignant neoplasm of breast: Secondary | ICD-10-CM

## 2018-04-20 LAB — POCT WET PREP WITH KOH
CLUE CELLS WET PREP PER HPF POC: NEGATIVE
KOH PREP POC: NEGATIVE
TRICHOMONAS UA: NEGATIVE
Yeast Wet Prep HPF POC: NEGATIVE

## 2018-04-20 MED ORDER — CLOTRIMAZOLE-BETAMETHASONE 1-0.05 % EX CREA: TOPICAL_CREAM | CUTANEOUS | 0 refills | Status: DC

## 2018-04-20 MED ORDER — TERCONAZOLE 0.4 % VA CREA: 1.0000 | TOPICAL_CREAM | Freq: Every day | VAGINAL | 0 refills | Status: DC

## 2018-04-20 NOTE — Patient Instructions (Signed)
I value your feedback and entrusting us with your care. If you get a [facility] patient survey, I would appreciate you taking the time to let us know about your experience today. Thank you! 

## 2018-04-20 NOTE — Progress Notes (Signed)
Chief Complaint  Patient presents with  . Gynecologic Exam     HPI:      [name] is a 42 y.o. [phone] who LMP was No LMP recorded. (Menstrual status: IUD)., presents today for her annual examination.  Her menses are absent with IUD. Pt had DUB last yr with endometrial mass on u/s, so pt had hysteroscopy/D&C/polypectomy and new IUD placement with Dr. Glennon Mac 10/18. Doing well.   Sex activity: single partner, contraception - IUD. Mirena placed 03/10/17. Last Pap: 01/18/17 Results were: no abnormalities /neg HPV DNA  Hx of STDs: none  She complains of several yeast infections recently after starting jardiance for DM. Blood sugars improved but pt having to take diflucan frequently for sx. Last treated with diflucan and monistat-3 last wk but still feels irritated today. Has occas blood with wiping, as well as burning with urination/wiping.   Last mammogram: pt gets mammos/[facility] at [facility] yearly for hx of  Breast mass/bx. There is no FH of breast cancer. There is no FH of ovarian cancer. The patient does do self-breast exams.  Tobacco use: The patient denies current or previous tobacco use. Alcohol use: social Exercise: min active  She does get adequate calcium and Vitamin D in her diet.  She has labs with PCP.  Past Medical History:  Diagnosis Date  . Bacterial vaginitis   . CHF (congestive heart failure) ([place])   . Complication of anesthesia    nausea/vomiting  . Depression   . Diabetes mellitus without complication ([place])   . History of abnormal mammogram 2013   @[facility] - NEG MRI AND BX OCT 2014 NORMAL  . History of mammogram 03/2015   BREAST MRI AT [facility] WNL  . History of Papanicolaou smear of cervix 10/08/2013; 08/04/15   -/-; ASCUS/HPV NEG;  . Hodgkin's disease ([place]) 1987   She had surgical resection of Lymph nodes and chemo + rad tx's.  Marland Kitchen Hypertension   . Hypothyroidism    2ND TO [place] TX  . PONV (postoperative nausea and vomiting)     Past Surgical History:    Procedure Laterality Date  . CHOLECYSTECTOMY    . DEEP NECK LYMPH NODE BIOPSY / EXCISION  1987   hodgkin lymphoma  . DILATATION & CURETTAGE/HYSTEROSCOPY WITH MYOSURE N/A 03/10/2017   Procedure: DILATATION & CURETTAGE/HYSTEROSCOPY WITH MYOSURE;  Surgeon: Will Bonnet, MD;  Location: [facility] ORS;  Service: Gynecology;  Laterality: N/A;  . INTRAUTERINE DEVICE (IUD) INSERTION  [id number]; 02/06/09; 12/12/13  . INTRAUTERINE DEVICE (IUD) INSERTION N/A 03/10/2017   Procedure: INTRAUTERINE DEVICE (IUD) INSERTION;  Surgeon: Will Bonnet, MD;  Location: [facility] ORS;  Service: Gynecology;  Laterality: N/A;  . SPLENECTOMY    . THYROIDECTOMY      Family History  Problem Relation Age of Onset  . Hypertension Mother     Social History   Socioeconomic History  . Marital status: Married    Spouse name: Not on file  . Number of children: Not on file  . Years of education: 61  . Highest education level: Not on file  Occupational History  . Occupation: Insurance risk surveyor    Employer: [employer]  . Financial resource strain: Not on file  . Food insecurity:    Worry: Not on file    Inability: Not on file  . Transportation needs:    Medical: Not on file    Non-medical: Not on file  Tobacco Use  . Smoking status: Former Smoker  Packs/day: 0.25    Years: 10.00    Pack years: 2.50    Types: Cigarettes    Last attempt to quit: 03/10/2016    Years since quitting: 2.1  . Smokeless tobacco: Never Used  Substance and Sexual Activity  . Alcohol use: Yes    Alcohol/week: 2.0 standard drinks    Types: 2 Glasses of wine per week    Comment: occasional  . Drug use: No  . Sexual activity: Yes    Birth control/protection: IUD    Comment: Mirena  Lifestyle  . Physical activity:    Days per week: Not on file    Minutes per session: Not on file  . Stress: Not on file  Relationships  . Social connections:    Talks on phone: Not on file    Gets together: Not on file     Attends religious service: Not on file    Active member of club or organization: Not on file    Attends meetings of clubs or organizations: Not on file    Relationship status: Not on file  . Intimate partner violence:    Fear of current or ex partner: Not on file    Emotionally abused: Not on file    Physically abused: Not on file    Forced sexual activity: Not on file  Other Topics Concern  . Not on file  Social History Narrative  . Not on file     Current Outpatient Medications:  .  ALPRAZolam (XANAX) 0.5 MG tablet, TAKE 1 TABLET BY MOUTH AT BEDTIME AS NEEDED FOR ANXIETY AND 0.5 TAB DURING THE DAY FOR PANIC ATTACKS, Disp: 45 tablet, Rfl: 5 .  atorvastatin (LIPITOR) 20 MG tablet, Take 20 mg by mouth at bedtime., Disp: , Rfl:  .  BD PEN NEEDLE NANO U/F 32G X 4 MM MISC, USE 1 TWICE A DAY, Disp: , Rfl: 10 .  CONTOUR NEXT TEST test strip, USE 2 TIMES DAILY TO TEST BLOOD SUGAR, Disp: , Rfl: 12 .  empagliflozin (JARDIANCE) 25 MG TABS tablet, Take by mouth., Disp: , Rfl:  .  esomeprazole (NEXIUM) 40 MG capsule, TAKE 1 CAPSULE BY MOUTH EVERY DAY, Disp: 90 capsule, Rfl: 3 .  levonorgestrel (MIRENA) 20 MCG/24HR IUD, 1 each by Intrauterine route once., Disp: , Rfl:  .  levothyroxine (SYNTHROID, LEVOTHROID) 112 MCG tablet, TAKE 1 TABLET EVERY DAY ON AN EMPTY STOMACH WITH A GLASS OF WATER AT LEAST 30-60 MINS BEFORE BFAST, Disp: , Rfl: 3 .  lisinopril (PRINIVIL,ZESTRIL) 5 MG tablet, Take 5 mg by mouth at bedtime. , Disp: , Rfl:  .  metFORMIN (GLUCOPHAGE-XR) 500 MG 24 hr tablet, Take by mouth., Disp: , Rfl:  .  metoprolol succinate (TOPROL-XL) 25 MG 24 hr tablet, Take 37.5 mg by mouth daily. , Disp: , Rfl:  .  NOVOLOG MIX 70/30 FLEXPEN (70-30) 100 UNIT/ML FlexPen, Inject 50 Units into the skin 2 (two) times daily. , Disp: , Rfl: 0 .  sertraline (ZOLOFT) 50 MG tablet, Take 1 tablet (50 mg total) by mouth at bedtime., Disp: 90 tablet, Rfl: 3 .  clotrimazole-betamethasone (LOTRISONE) cream, Apply  externally BID prn sx up to 2 wks, Disp: 15 g, Rfl: 0 .  terconazole (TERAZOL 7) 0.4 % vaginal cream, Place 1 applicator vaginally at bedtime., Disp: 45 g, Rfl: 0  ROS:  Review of Systems  Constitutional: Negative for fatigue, fever and unexpected weight change.  Respiratory: Negative for cough, shortness of breath and wheezing.   Cardiovascular:  Negative for chest pain, palpitations and leg swelling.  Gastrointestinal: Negative for blood in stool, constipation, diarrhea, nausea and vomiting.  Endocrine: Negative for cold intolerance, heat intolerance and polyuria.  Genitourinary: Positive for vaginal bleeding. Negative for dyspareunia, dysuria, flank pain, frequency, genital sores, hematuria, menstrual problem, pelvic pain, urgency and vaginal discharge.  Musculoskeletal: Negative for back pain, joint swelling and myalgias.  Skin: Negative for rash.  Neurological: Negative for dizziness, syncope, light-headedness, numbness and headaches.  Hematological: Negative for adenopathy.  Psychiatric/Behavioral: Negative for agitation, confusion, sleep disturbance and suicidal ideas. The patient is not nervous/anxious.      Objective: BP 138/70   Pulse 85   Ht 5\' 6"  (1.676 m)   Wt 160 lb (72.6 kg)   BMI 25.82 kg/m    Physical Exam  Constitutional: She is oriented to person, place, and time. She appears well-developed and well-nourished.  Genitourinary: Vagina normal and uterus normal.  There is rash on the right labia. There is no tenderness on the right labia.  There is rash on the left labia. There is no tenderness on the left labia. No erythema or tenderness in the vagina. No vaginal discharge found. Right adnexum does not display mass and does not display tenderness. Left adnexum does not display mass and does not display tenderness. Cervix does not exhibit motion tenderness or polyp. Uterus is not enlarged or tender.  Neck: Normal range of motion. No thyromegaly present.   Cardiovascular: Normal rate, regular rhythm and normal heart sounds.  No murmur heard. Pulmonary/Chest: Effort normal and breath sounds normal. Right breast exhibits no mass, no nipple discharge, no skin change and no tenderness. Left breast exhibits no mass, no nipple discharge, no skin change and no tenderness.  Abdominal: Soft. There is no tenderness. There is no guarding.  Musculoskeletal: Normal range of motion.  Neurological: She is alert and oriented to person, place, and time. No cranial nerve deficit.  Psychiatric: She has a normal mood and affect. Her behavior is normal.  Vitals reviewed.   Results: Results for orders placed or performed in visit on 04/20/18 (from the past 24 hour(s))  POCT Wet Prep with KOH     Status: Normal   Collection Time: 04/20/18  2:32 PM  Result Value Ref Range   Trichomonas, UA Negative    Clue Cells Wet Prep HPF POC neg    Epithelial Wet Prep HPF POC     Yeast Wet Prep HPF POC neg    Bacteria Wet Prep HPF POC     RBC Wet Prep HPF POC     WBC Wet Prep HPF POC     KOH Prep POC Negative Negative    Assessment/Plan: Encounter for annual routine gynecological examination  Screening for breast cancer - Pt [facility].  Encounter for routine checking of intrauterine contraceptive device (IUD) - IUD strings in place.  Acute vaginitis - Pos ext sx/neg wet prep. Rx lotrisone crm for now. Rx terazol-7 if sx recur. Add probiotics. F/u prn - Plan: terconazole (TERAZOL 7) 0.4 % vaginal cream, clotrimazole-betamethasone (LOTRISONE) cream, POCT Wet Prep with KOH             GYN counsel adequate intake of calcium and vitamin D     F/U  Return in about 1 year (around 04/21/2019).  Alicia B. Copland, PA-C 04/20/2018 2:38 PM

## 2018-05-16 ENCOUNTER — Ambulatory Visit (INDEPENDENT_AMBULATORY_CARE_PROVIDER_SITE_OTHER): Payer: BLUE CROSS/BLUE SHIELD | Admitting: Family Medicine

## 2018-05-16 ENCOUNTER — Ambulatory Visit
Admission: RE | Admit: 2018-05-16 | Discharge: 2018-05-16 | Disposition: A | Discharge status: Home / Self Care | Payer: BLUE CROSS/BLUE SHIELD | Source: Ambulatory Visit | Attending: Family Medicine | Admitting: Family Medicine

## 2018-05-16 ENCOUNTER — Encounter: Payer: Self-pay | Admitting: Family Medicine

## 2018-05-16 VITALS — BP 120/70 | HR 100 | Temp 98.2°F | Resp 16 | Wt 154.0 lb

## 2018-05-16 DIAGNOSIS — M25551 Pain in right hip: Secondary | ICD-10-CM | POA: Diagnosis not present

## 2018-05-16 MED ORDER — PREDNISONE 5 MG PO TABS: 5.0000 mg | ORAL_TABLET | Freq: Every day | ORAL | 0 refills | Status: DC

## 2018-05-16 NOTE — Patient Instructions (Signed)
Trochanteric Bursitis Trochanteric bursitis is a condition that causes hip pain. Trochanteric bursitis happens when fluid-filled sacs (bursae) in the hip get irritated. Normally these sacs absorb shock and help strong bands of tissue (tendons) in your hip glide smoothly over each other and over your hip bones. What are the causes? This condition results from increased friction between the hip bones and the tendons that go over them. This condition can happen if you:  Have weak hips.  Use your hip muscles too much (overuse).  Get hit in the hip.  What increases the risk? This condition is more likely to develop in:  Women.  Adults who are middle-aged or older.  People with arthritis or a spinal condition.  People with weak buttocks muscles (gluteal muscles).  People who have one leg that is shorter than the other.  People who participate in certain kinds of athletic activities, such as: ? Running sports, especially long-distance running. ? Contact sports, like football or martial arts. ? Sports in which falls may occur, like skiing.  What are the signs or symptoms? The main symptom of this condition is pain and tenderness over the point of your hip. The pain may be:  Sharp and intense.  Dull and achy.  Felt on the outside of your thigh.  It may increase when you:  Lie on your side.  Walk or run.  Go up on stairs.  Sit.  Stand up after sitting.  Stand for long periods of time.  How is this diagnosed? This condition may be diagnosed based on:  Your symptoms.  Your medical history.  A physical exam.  Imaging tests, such as: ? X-rays to check your bones. ? An MRI or ultrasound to check your tendons and muscles.  During your physical exam, your health care provider will check the movement and strength of your hip. He or she may press on the point of your hip to check for pain. How is this treated? This condition may be treated by:  Resting.  Reducing  your activity.  Avoiding activities that cause pain.  Using crutches, a cane, or a walker to decrease the strain on your hip.  Taking medicine to help with swelling.  Having medicine injected into the bursae to help with swelling.  Using ice, heat, and massage therapy for pain relief.  Physical therapy exercises for strength and flexibility.  Surgery (rare).  Follow these instructions at home: Activity  Rest.  Avoid activities that cause pain.  Return to your normal activities as told by your health care provider. Ask your health care provider what activities are safe for you. Managing pain, stiffness, and swelling  Take over-the-counter and prescription medicines only as told by your health care provider.  If directed, apply heat to the injured area as told by your health care provider. ? Place a towel between your skin and the heat source. ? Leave the heat on for 20-30 minutes. ? Remove the heat if your skin turns bright red. This is especially important if you are unable to feel pain, heat, or cold. You may have a greater risk of getting burned.  If directed, apply ice to the injured area: ? Put ice in a plastic bag. ? Place a towel between your skin and the bag. ? Leave the ice on for 20 minutes, 2-3 times a day. General instructions  If the affected leg is one that you use for driving, ask your health care provider when it is safe to drive.    Use crutches, a cane, or a walker as told by your health care provider.  If one of your legs is shorter than the other, get fitted for a shoe insert.  Lose weight if you are overweight. How is this prevented?  Wear supportive footwear that is appropriate for your sport.  If you have hip pain, start any new exercise or sport slowly.  Maintain physical fitness, including: ? Strength. ? Flexibility. Contact a health care provider if:  Your pain does not improve with 2-4 weeks. Get help right away if:  You develop  severe pain.  You have a fever.  You develop increased redness over your hip.  You have a change in your bowel function or bladder function.  You cannot control the muscles in your feet. This information is not intended to replace advice given to you by your health care provider. Make sure you discuss any questions you have with your health care provider. Document Released: 07/01/2004 Document Revised: 01/28/2016 Document Reviewed: 05/09/2015 Elsevier Interactive Patient Education  2018 [employer]  

## 2018-05-16 NOTE — Progress Notes (Signed)
Patient: Lori [name] Female    DOB: 01-22-1976   42 y.o.   MRN: [id number] Visit Date: 05/16/2018  Today's Provider: Vernie Murders, PA   Chief Complaint  Patient presents with  . Hip Pain   Subjective:    Patient states her right hip started hurting 2 weeks ago. Patient states she didn't injure herself, her hip just started hurting. Pain is described as aching and is constant. Pain does radiate down to about mid thigh. Patient has been taking Motrin with mild relief.     Past Medical History:  Diagnosis Date  . Bacterial vaginitis   . CHF (congestive heart failure) ([place])   . Complication of anesthesia    nausea/vomiting  . Depression   . Diabetes mellitus without complication ([place])   . History of abnormal mammogram 2013   @[facility] - NEG MRI AND BX OCT 2014 NORMAL  . History of mammogram 03/2015   BREAST MRI AT [facility] WNL  . History of Papanicolaou smear of cervix 10/08/2013; 08/04/15   -/-; ASCUS/HPV NEG;  . Hodgkin's disease ([place]) 1987   She had surgical resection of Lymph nodes and chemo + rad tx's.  Marland Kitchen Hypertension   . Hypothyroidism    2ND TO [place] TX  . PONV (postoperative nausea and vomiting)    Past Surgical History:  Procedure Laterality Date  . CHOLECYSTECTOMY    . DEEP NECK LYMPH NODE BIOPSY / EXCISION  1987   hodgkin lymphoma  . DILATATION & CURETTAGE/HYSTEROSCOPY WITH MYOSURE N/A 03/10/2017   Procedure: DILATATION & CURETTAGE/HYSTEROSCOPY WITH MYOSURE;  Surgeon: Will Bonnet, MD;  Location: [facility] ORS;  Service: Gynecology;  Laterality: N/A;  . INTRAUTERINE DEVICE (IUD) INSERTION  [id number]; 02/06/09; 12/12/13  . INTRAUTERINE DEVICE (IUD) INSERTION N/A 03/10/2017   Procedure: INTRAUTERINE DEVICE (IUD) INSERTION;  Surgeon: Will Bonnet, MD;  Location: [facility] OR[facility];  Service: Gynecology;  Laterality: N/A;  . SPLENECTOMY    . THYROIDECTOMY     Family History  Problem Relation Age of Onset  . Hypertension Mother    Allergies  Allergen Reactions  .  Dapagliflozin Other (See Comments)    Wilder Glade) UTI  . Liraglutide Nausea Only    Victoza    Current Outpatient Medications:  .  ALPRAZolam (XANAX) 0.5 MG tablet, TAKE 1 TABLET BY MOUTH AT BEDTIME AS NEEDED FOR ANXIETY AND 0.5 TAB DURING THE DAY FOR PANIC ATTACKS, Disp: 45 tablet, Rfl: 5 .  atorvastatin (LIPITOR) 20 MG tablet, Take 20 mg by mouth at bedtime., Disp: , Rfl:  .  BD PEN NEEDLE NANO U/F 32G X 4 MM MISC, USE 1 TWICE A DAY, Disp: , Rfl: 10 .  CONTOUR NEXT TEST test strip, USE 2 TIMES DAILY TO TEST BLOOD SUGAR, Disp: , Rfl: 12 .  empagliflozin (JARDIANCE) 25 MG TABS tablet, Take by mouth., Disp: , Rfl:  .  esomeprazole (NEXIUM) 40 MG capsule, TAKE 1 CAPSULE BY MOUTH EVERY DAY, Disp: 90 capsule, Rfl: 3 .  levonorgestrel (MIRENA) 20 MCG/24HR IUD, 1 each by Intrauterine route once., Disp: , Rfl:  .  levothyroxine (SYNTHROID, LEVOTHROID) 112 MCG tablet, TAKE 1 TABLET EVERY DAY ON AN EMPTY STOMACH WITH A GLASS OF WATER AT LEAST 30-60 MINS BEFORE BFAST, Disp: , Rfl: 3 .  lisinopril (PRINIVIL,ZESTRIL) 5 MG tablet, Take 5 mg by mouth at bedtime. , Disp: , Rfl:  .  metFORMIN (GLUCOPHAGE-XR) 500 MG 24 hr tablet, Take by mouth., Disp: , Rfl:  .  metoprolol succinate (  TOPROL-XL) 25 MG 24 hr tablet, Take 37.5 mg by mouth daily. , Disp: , Rfl:  .  NOVOLOG MIX 70/30 FLEXPEN (70-30) 100 UNIT/ML FlexPen, Inject 50 Units into the skin 2 (two) times daily. , Disp: , Rfl: 0 .  sertraline (ZOLOFT) 50 MG tablet, Take 1 tablet (50 mg total) by mouth at bedtime., Disp: 90 tablet, Rfl: 3 .  clotrimazole-betamethasone (LOTRISONE) cream, Apply externally BID prn sx up to 2 wks (Patient not taking: Reported on 05/16/2018), Disp: 15 g, Rfl: 0 .  terconazole (TERAZOL 7) 0.4 % vaginal cream, Place 1 applicator vaginally at bedtime. (Patient not taking: Reported on 05/16/2018), Disp: 45 g, Rfl: 0  Review of Systems  Constitutional: Negative for appetite change, chills, fatigue and fever.  Respiratory: Negative for  chest tightness and shortness of breath.   Cardiovascular: Negative for chest pain and palpitations.  Gastrointestinal: Negative for abdominal pain, nausea and vomiting.  Neurological: Negative for dizziness, tingling, weakness and numbness.   Social History   Tobacco Use  . Smoking status: Former Smoker    Packs/day: 0.25    Years: 10.00    Pack years: 2.50    Types: Cigarettes    Last attempt to quit: 03/10/2016    Years since quitting: 2.1  . Smokeless tobacco: Never Used  Substance Use Topics  . Alcohol use: Yes    Alcohol/week: 2.0 standard drinks    Types: 2 Glasses of wine per week    Comment: occasional   Objective:   BP 120/70 (BP Location: Right Arm, Patient Position: Sitting, Cuff Size: Normal)   Pulse 100   Temp 98.2 F (36.8 C) (Oral)   Resp 16   Wt 154 lb (69.9 kg)   SpO2 98%   BMI 24.86 kg/m  Vitals:   05/16/18 1423  BP: 120/70  Pulse: 100  Resp: 16  Temp: 98.2 F (36.8 C)  TempSrc: Oral  SpO2: 98%  Weight: 154 lb (69.9 kg)   Physical Exam  Constitutional: She is oriented to person, place, and time. She appears well-developed and well-nourished. No distress.  HENT:  Head: Normocephalic and atraumatic.  Right Ear: Hearing normal.  Left Ear: Hearing normal.  Nose: Nose normal.  Eyes: Conjunctivae and lids are normal. Right eye exhibits no discharge. Left eye exhibits no discharge. No scleral icterus.  Neck: Neck supple.  Cardiovascular: Normal rate and regular rhythm.  Pulmonary/Chest: Effort normal. No respiratory distress.  Abdominal: Soft.  Musculoskeletal: Normal range of motion. She exhibits tenderness.  Tender over the right greater trochanter. Some increase in pain to cross right leg over the left. No crepitus or pain in groin.  Neurological: She is alert and oriented to person, place, and time.  Skin: Skin is intact. No lesion and no rash noted.  Psychiatric: She has a normal mood and affect. Her speech is normal and behavior is normal.  Thought content normal.      Assessment & Plan:     1. Right hip pain Onset of tenderness over the right greater trochanter the past 2 weeks. Mild relief from Ibuprofen 800 mg TID and moist heat applications. Will get x-ray evaluation and switch to a prednisone taper. Apply moist heat or ice packs prn. Recheck pending reports. May need referral to orthopedist if no better in 4-5 days. - DG HIP UNILAT WITH PELVIS 2-3 VIEWS RIGHT - predniSONE (DELTASONE) 5 MG tablet; Take 1 tablet (5 mg total) by mouth daily with breakfast. Taper down by one tablet daily starting at  6 tablets (6,5,4,3,2,1).  Dispense: 21 tablet; Refill: [place], [place]  [facility]

## 2018-05-19 ENCOUNTER — Telehealth: Payer: Self-pay | Admitting: *Deleted

## 2018-05-19 NOTE — Telephone Encounter (Signed)
-----   Message from Margo Common, [place] sent at 05/18/2018  5:40 PM EST ----- No sign of arthritis in the right hip. If no better after taking the prednisone taper, may need referral to an orthopedist for possible cortisone injection.

## 2018-05-19 NOTE — Telephone Encounter (Signed)
Patient was notified of results. Expressed understanding.  

## 2018-05-19 NOTE — Telephone Encounter (Signed)
Pt returned missed call.  Please call pt back. ° °Thanks, °[facility] °

## 2018-05-19 NOTE — Telephone Encounter (Signed)
LMOVM for pt to return call 

## 2018-06-12 ENCOUNTER — Encounter: Payer: Self-pay | Admitting: Family Medicine

## 2018-06-12 ENCOUNTER — Ambulatory Visit: Payer: BLUE CROSS/BLUE SHIELD | Admitting: Family Medicine

## 2018-06-12 ENCOUNTER — Ambulatory Visit: Payer: Self-pay | Admitting: Family Medicine

## 2018-06-12 ENCOUNTER — Other Ambulatory Visit: Payer: Self-pay

## 2018-06-12 VITALS — BP 110/72 | HR 90 | Temp 97.8°F | Ht 66.0 in | Wt 157.6 lb

## 2018-06-12 DIAGNOSIS — R300 Dysuria: Secondary | ICD-10-CM

## 2018-06-12 DIAGNOSIS — R1030 Lower abdominal pain, unspecified: Secondary | ICD-10-CM

## 2018-06-12 LAB — POCT URINALYSIS DIPSTICK
Bilirubin, UA: NEGATIVE
Glucose, UA: POSITIVE — AB
Ketones, UA: NEGATIVE
Leukocytes, UA: NEGATIVE
Nitrite, UA: NEGATIVE
Protein, UA: NEGATIVE
RBC UA: NEGATIVE
Spec Grav, UA: <=1.005 — AB (ref 1.010–1.025)
UROBILINOGEN UA: 0.2 U/dL
pH, UA: 6 (ref 5.0–8.0)

## 2018-06-12 MED ORDER — CEPHALEXIN 500 MG PO CAPS: 500.0000 mg | ORAL_CAPSULE | Freq: Two times a day (BID) | ORAL | 0 refills | Status: DC

## 2018-06-12 NOTE — Patient Instructions (Signed)
Continue ibuprofen and may add Tylenol as needed (not to exceed 3000 mg.) Let me now if not improving or new symptoms.

## 2018-06-12 NOTE — Progress Notes (Signed)
  Subjective:     Patient ID: Lori [name], female   DOB: [birth year], 43 y.o.   MRN: [id number] Chief Complaint  Patient presents with  . Abdominal Pain    around to back,burning, a lot of pressure, will urinate and still feel the urgency to go.  since 05/30/18   HPI States she has treated herself with 5 days of Amoxicillin and AZO. Reports taking ibuprofen for right sided low back pain which wakes her up at night. No vaginal discharge or hx of kidney stones. Hx of recent polypectomies but no diverticulosis. Review of Systems     Objective:   Physical Exam Constitutional:      General: She is not in acute distress.    Appearance: She is well-developed. She is not ill-appearing.  Abdominal:     General: Bowel sounds are normal.     Tenderness: There is abdominal tenderness (mild in bilateral lower quadrants). There is no guarding.  Genitourinary:    Comments: No cva tenderness Neurological:     Mental Status: She is alert.        Assessment:    1. Dysuria - POCT Urinalysis Dipstick - cephALEXin (KEFLEX) 500 MG capsule; Take 1 capsule (500 mg total) by mouth 2 (two) times daily.  Dispense: 14 capsule; Refill: 0  2. Lower abdominal pain - cephALEXin (KEFLEX) 500 MG capsule; Take 1 capsule (500 mg total) by mouth 2 (two) times daily.  Dispense: 14 capsule; Refill: 0    Plan:    Discussed use of ibuprofen and Tylenol for pain. Will call for new sx or not improving. Consider ultrasound.

## 2018-06-13 ENCOUNTER — Ambulatory Visit: Payer: BLUE CROSS/BLUE SHIELD | Admitting: Physician Assistant

## 2018-07-05 DIAGNOSIS — E1165 Type 2 diabetes mellitus with hyperglycemia: Secondary | ICD-10-CM | POA: Diagnosis not present

## 2018-07-05 DIAGNOSIS — I10 Essential (primary) hypertension: Secondary | ICD-10-CM | POA: Diagnosis not present

## 2018-07-05 DIAGNOSIS — E782 Mixed hyperlipidemia: Secondary | ICD-10-CM | POA: Diagnosis not present

## 2018-07-05 DIAGNOSIS — Z794 Long term (current) use of insulin: Secondary | ICD-10-CM | POA: Diagnosis not present

## 2018-07-05 DIAGNOSIS — E119 Type 2 diabetes mellitus without complications: Secondary | ICD-10-CM | POA: Diagnosis not present

## 2018-07-05 DIAGNOSIS — E890 Postprocedural hypothyroidism: Secondary | ICD-10-CM | POA: Diagnosis not present

## 2018-07-10 ENCOUNTER — Other Ambulatory Visit: Payer: Self-pay | Admitting: Physician Assistant

## 2018-07-10 DIAGNOSIS — F339 Major depressive disorder, recurrent, unspecified: Secondary | ICD-10-CM

## 2018-07-12 ENCOUNTER — Ambulatory Visit: Payer: BLUE CROSS/BLUE SHIELD | Admitting: Physician Assistant

## 2018-07-12 VITALS — BP 106/66 | HR 79 | Temp 97.8°F | Resp 16 | Wt 158.8 lb

## 2018-07-12 DIAGNOSIS — J029 Acute pharyngitis, unspecified: Secondary | ICD-10-CM | POA: Diagnosis not present

## 2018-07-12 DIAGNOSIS — R059 Cough, unspecified: Secondary | ICD-10-CM

## 2018-07-12 DIAGNOSIS — R05 Cough: Secondary | ICD-10-CM | POA: Diagnosis not present

## 2018-07-12 LAB — POCT RAPID STREP A (OFFICE): Rapid Strep A Screen: NEGATIVE

## 2018-07-12 MED ORDER — PROMETHAZINE-DM 6.25-15 MG/5ML PO SYRP: 5.0000 mL | ORAL_SOLUTION | Freq: Four times a day (QID) | ORAL | 0 refills | Status: DC | PRN

## 2018-07-12 NOTE — Progress Notes (Signed)
Patient: Lori [name] Female    DOB: 06/01/1976   43 y.o.   MRN: [id number] Visit Date: 07/12/2018  Today's Provider: Trinna Post, PA-C   Chief Complaint  Patient presents with  . Sinusitis  . URI   Subjective:     HPI Upper Respiratory Infection: Patient complains of symptoms of a URI, possible sinusitis. Symptoms include bilateral ear pain, congestion, cough, plugged sensation in both ears, sore throat and swollen glands. Onset of symptoms was 4 days ago, unchanged since that time. She also c/o facial pain, low grade fever, post nasal drip, sinus pressure and sore throat for the past 4 days .  She is drinking plenty of fluids. Evaluation to date: none. Treatment to date: cough suppressants.    Allergies  Allergen Reactions  . Dapagliflozin Other (See Comments)    Wilder Glade) UTI  . Liraglutide Nausea Only    Victoza     Current Outpatient Medications:  .  ALPRAZolam (XANAX) 0.5 MG tablet, TAKE 1 TABLET BY MOUTH AT BEDTIME AS NEEDED FOR ANXIETY AND 0.5 TAB DURING THE DAY FOR PANIC ATTACKS, Disp: 45 tablet, Rfl: 5 .  atorvastatin (LIPITOR) 20 MG tablet, Take 20 mg by mouth at bedtime., Disp: , Rfl:  .  empagliflozin (JARDIANCE) 25 MG TABS tablet, Take by mouth., Disp: , Rfl:  .  esomeprazole (NEXIUM) 40 MG capsule, TAKE 1 CAPSULE BY MOUTH EVERY DAY, Disp: 90 capsule, Rfl: 3 .  levonorgestrel (MIRENA) 20 MCG/24HR IUD, 1 each by Intrauterine route once., Disp: , Rfl:  .  levothyroxine (SYNTHROID, LEVOTHROID) 112 MCG tablet, TAKE 1 TABLET EVERY DAY ON AN EMPTY STOMACH WITH A GLASS OF WATER AT LEAST 30-60 MINS BEFORE BFAST, Disp: , Rfl: 3 .  lisinopril (PRINIVIL,ZESTRIL) 5 MG tablet, Take 5 mg by mouth at bedtime. , Disp: , Rfl:  .  metFORMIN (GLUCOPHAGE-XR) 500 MG 24 hr tablet, Take by mouth., Disp: , Rfl:  .  metoprolol succinate (TOPROL-XL) 25 MG 24 hr tablet, Take 37.5 mg by mouth daily. , Disp: , Rfl:  .  NOVOLOG MIX 70/30 FLEXPEN (70-30) 100 UNIT/ML FlexPen, Inject  50 Units into the skin 2 (two) times daily. , Disp: , Rfl: 0 .  sertraline (ZOLOFT) 50 MG tablet, TAKE 1 TABLET BY MOUTH EVERYDAY AT BEDTIME, Disp: 90 tablet, Rfl: 3 .  BD PEN NEEDLE NANO U/F 32G X 4 MM MISC, USE 1 TWICE A DAY, Disp: , Rfl: 10 .  cephALEXin (KEFLEX) 500 MG capsule, Take 1 capsule (500 mg total) by mouth 2 (two) times daily., Disp: 14 capsule, Rfl: 0 .  CONTOUR NEXT TEST test strip, USE 2 TIMES DAILY TO TEST BLOOD SUGAR, Disp: , Rfl: 12 .  predniSONE (DELTASONE) 5 MG tablet, Take 1 tablet (5 mg total) by mouth daily with breakfast. Taper down by one tablet daily starting at 6 tablets (6,5,4,3,2,1). (Patient not taking: Reported on 06/12/2018), Disp: 21 tablet, Rfl: 0 .  terconazole (TERAZOL 7) 0.4 % vaginal cream, Place 1 applicator vaginally at bedtime. (Patient not taking: Reported on 05/16/2018), Disp: 45 g, Rfl: 0  Review of Systems  HENT: Positive for congestion and sore throat.   Respiratory: Positive for cough.     Social History   Tobacco Use  . Smoking status: Former Smoker    Packs/day: 0.25    Years: 10.00    Pack years: 2.50    Types: Cigarettes    Last attempt to quit: 03/10/2016    Years  since quitting: 2.3  . Smokeless tobacco: Never Used  Substance Use Topics  . Alcohol use: Yes    Alcohol/week: 2.0 standard drinks    Types: 2 Glasses of wine per week    Comment: occasional      Objective:   BP 106/66 (BP Location: Left Arm, Patient Position: Sitting, Cuff Size: Normal)   Pulse 79   Temp 97.8 F (36.6 C) (Oral)   Resp 16   Wt 158 lb 12.8 oz (72 kg)   HC 16" (40.6 cm)   SpO2 98%   BMI 25.63 kg/m  Vitals:   07/12/18 0922  BP: 106/66  Pulse: 79  Resp: 16  Temp: 97.8 F (36.6 C)  TempSrc: Oral  SpO2: 98%  Weight: 158 lb 12.8 oz (72 kg)  HC: 16" (40.6 cm)     Physical Exam Constitutional:      Appearance: She is well-developed.  HENT:     Right Ear: External ear normal.     Left Ear: External ear normal.     Mouth/Throat:      Pharynx: No oropharyngeal exudate.  Eyes:     General:        Right eye: Discharge present.        Left eye: Discharge present. Neck:     Musculoskeletal: Neck supple.  Cardiovascular:     Rate and Rhythm: Normal rate and regular rhythm.  Pulmonary:     Effort: Pulmonary effort is normal. No respiratory distress.     Breath sounds: Normal breath sounds. No rales.  Lymphadenopathy:     Cervical: No cervical adenopathy.  Skin:    General: Skin is warm and dry.  Psychiatric:        Behavior: Behavior normal.         Assessment & Plan    1. Sore throat  Rapid strep negative.  Counseled regarding signs and symptoms of viral and bacterial respiratory infections. Advised to call or return for additional evaluation if she develops any sign of bacterial infection, or if current symptoms last longer than 10 days.   - POCT rapid strep A  2. Cough  - promethazine-dextromethorphan (PROMETHAZINE-DM) 6.25-15 MG/5ML syrup; Take 5 mLs by mouth 4 (four) times daily as needed for cough.  Dispense: 118 mL; Refill: 0  The entirety of the information documented in the History of Present Illness, Review of Systems and Physical Exam were personally obtained by me. Portions of this information were initially documented by Lynford Humphrey, CMA and reviewed by me for thoroughness and accuracy.   Return if symptoms worsen or fail to improve.     Trinna Post, PA-C  [facility]

## 2018-07-12 NOTE — Patient Instructions (Addendum)
Coricidin: decongestant for people with high blood pressure Xyzal: antihistamine/allergy medication take this daily   Viral Respiratory Infection A viral respiratory infection is an illness that affects parts of the body that are used for breathing. These include the lungs, nose, and throat. It is caused by a germ called a virus. Some examples of this kind of infection are:  A cold.  The flu (influenza).  A respiratory syncytial virus (RSV) infection. A person who gets this illness may have the following symptoms:  A stuffy or runny nose.  Yellow or green fluid in the nose.  A cough.  Sneezing.  Tiredness (fatigue).  Achy muscles.  A sore throat.  Sweating or chills.  A fever.  A headache. Follow these instructions at home: Managing pain and congestion  Take over-the-counter and prescription medicines only as told by your doctor.  If you have a sore throat, gargle with salt water. Do this 3-4 times per day or as needed. To make a salt-water mixture, dissolve -1 tsp of salt in 1 cup of warm water. Make sure that all the salt dissolves.  Use nose drops made from salt water. This helps with stuffiness (congestion). It also helps soften the skin around your nose.  Drink enough fluid to keep your pee (urine) pale yellow. General instructions   Rest as much as possible.  Do not drink alcohol.  Do not use any products that have nicotine or tobacco, such as cigarettes and e-cigarettes. If you need help quitting, ask your doctor.  Keep all follow-up visits as told by your doctor. This is important. How is this prevented?   Get a flu shot every year. Ask your doctor when you should get your flu shot.  Do not let other people get your germs. If you are sick: ? Stay home from work or school. ? Wash your hands with soap and water often. Wash your hands after you cough or sneeze. If soap and water are not available, use hand sanitizer.  Avoid contact with people who  are sick during cold and flu season. This is in fall and winter. Get help if:  Your symptoms last for 10 days or longer.  Your symptoms get worse over time.  You have a fever.  You have very bad pain in your face or forehead.  Parts of your jaw or neck become very swollen. Get help right away if:  You feel pain or pressure in your chest.  You have shortness of breath.  You faint or feel like you will faint.  You keep throwing up (vomiting).  You feel confused. Summary  A viral respiratory infection is an illness that affects parts of the body that are used for breathing.  Examples of this illness include a cold, the flu, and respiratory syncytial virus (RSV) infection.  The infection can cause a runny nose, cough, sneezing, sore throat, and fever.  Follow what your doctor tells you about taking medicines, drinking lots of fluid, washing your hands, resting at home, and avoiding people who are sick. This information is not intended to replace advice given to you by your health care provider. Make sure you discuss any questions you have with your health care provider. Document Released: 05/06/2008 Document Revised: 07/04/2017 Document Reviewed: 07/04/2017 Elsevier Interactive Patient Education  2019 [employer].

## 2018-07-17 ENCOUNTER — Telehealth: Payer: Self-pay | Admitting: Physician Assistant

## 2018-07-17 DIAGNOSIS — R05 Cough: Secondary | ICD-10-CM

## 2018-07-17 DIAGNOSIS — J0140 Acute pansinusitis, unspecified: Secondary | ICD-10-CM

## 2018-07-17 DIAGNOSIS — R059 Cough, unspecified: Secondary | ICD-10-CM

## 2018-07-17 MED ORDER — AMOXICILLIN-POT CLAVULANATE 875-125 MG PO TABS: 1.0000 | ORAL_TABLET | Freq: Two times a day (BID) | ORAL | 0 refills | Status: DC

## 2018-07-17 NOTE — Telephone Encounter (Signed)
Patient reports that cough is not better, coughing up green mucus, chest congestion, head ache, sinus pain and pressure. Patient reports she has been taking xyzal, coricidin and motrin. Please advise.  [facility] in [place].

## 2018-07-17 NOTE — Telephone Encounter (Signed)
Patient was advised.  

## 2018-07-17 NOTE — Telephone Encounter (Signed)
Pt calling back to check what is suggested. Please call pt back on Cell  [phone].  Thanks, [employer]

## 2018-07-17 NOTE — Telephone Encounter (Signed)
Pt needing to what els she needs to do.  She is not getting better.  She is getting worse since visit  with Adriana.   Please advise.  Thanks, [employer]

## 2018-07-17 NOTE — Telephone Encounter (Signed)
Augmentin sent in for her

## 2018-07-18 NOTE — Telephone Encounter (Signed)
Pt is almost out of cough medication.  Pt needing to speak with a nurse to get rid of cough. Please call pt at [phone].  Thanks, [employer]

## 2018-07-19 ENCOUNTER — Encounter: Payer: Self-pay | Admitting: Physician Assistant

## 2018-07-19 MED ORDER — PROMETHAZINE-DM 6.25-15 MG/5ML PO SYRP: 5.0000 mL | ORAL_SOLUTION | Freq: Four times a day (QID) | ORAL | 0 refills | Status: DC | PRN

## 2018-07-19 NOTE — Telephone Encounter (Signed)
Patient was advised.  

## 2018-07-19 NOTE — Telephone Encounter (Signed)
Please Advise

## 2018-07-19 NOTE — Addendum Note (Signed)
Addended by: Mar Daring on: 07/19/2018 07:30 AM   Modules accepted: Orders

## 2018-07-19 NOTE — Telephone Encounter (Signed)
Sent second dose of Promethazine DM for her

## 2018-08-02 ENCOUNTER — Ambulatory Visit: Payer: BLUE CROSS/BLUE SHIELD | Admitting: Physician Assistant

## 2018-08-02 ENCOUNTER — Encounter: Payer: Self-pay | Admitting: Physician Assistant

## 2018-08-02 VITALS — BP 100/67 | HR 72 | Temp 98.2°F | Resp 16 | Wt 157.0 lb

## 2018-08-02 DIAGNOSIS — M7061 Trochanteric bursitis, right hip: Secondary | ICD-10-CM

## 2018-08-02 DIAGNOSIS — M7631 Iliotibial band syndrome, right leg: Secondary | ICD-10-CM | POA: Diagnosis not present

## 2018-08-02 MED ORDER — ETODOLAC 500 MG PO TABS: 500.0000 mg | ORAL_TABLET | Freq: Two times a day (BID) | ORAL | 1 refills | Status: DC

## 2018-08-02 MED ORDER — METHYLPREDNISOLONE ACETATE 40 MG/ML IJ SUSP
80.0000 mg | Freq: Once | INTRAMUSCULAR | Status: AC
  Administered 2018-08-02: 80 mg via INTRA_ARTICULAR

## 2018-08-02 NOTE — Patient Instructions (Signed)
Hip Injection, Care After Refer to this sheet in the next few weeks. These instructions provide you with information about caring for yourself after your procedure. Your health care provider may also give you more specific instructions. Your treatment has been planned according to current medical practices, but problems sometimes occur. Call your health care provider if you have any problems or questions after your procedure. WHAT TO EXPECT AFTER THE PROCEDURE After your procedure, it is common to have:  Soreness.  Warmth.  Swelling. You may have more pain, swelling, and warmth than you did before the injection. This reaction may last for about one day.  HOME CARE INSTRUCTIONS Bathing  If you were given a bandage (dressing), keep it dry until your health care provider says it can be removed. Ask your health care provider when you can start showering or taking a bath.  Managing Pain, Stiffness, and Swelling  If directed, apply ice to the injection area:  Put ice in a plastic bag.  Place a towel between your skin and the bag.  Leave the ice on for 20 minutes, 2-3 times per day.  Do not apply heat to your hip.  Raise the injection area above the level of your heart while you are sitting or lying down. Activity  Avoid strenuous activities for as long as directed by your health care provider. Ask your health care provider when you can return to your normal activities.  General Instructions  Take medicines only as directed by your health care provider.  Do not take aspirin or other over-the-counter medicines unless your health care provider says you can.  Check your injection site every day for signs of infection. Watch for:  Redness, swelling, or pain.  Fluid, blood, or pus.  Follow your health care provider's instructions about dressing changes and removal. SEEK MEDICAL CARE IF:  You have symptoms at your injection site that last longer than two days after your  procedure.  You have redness, swelling, or pain in your injection area.  You have fluid, blood, or pus coming from your injection site.  You have warmth in your injection area.  You have a fever.  Your pain is not controlled with medicine. SEEK IMMEDIATE MEDICAL CARE IF:  Your hip turns very red.  Your hip becomes very swollen.  Your hip pain is severe.   This information is not intended to replace advice given to you by your health care provider. Make sure you discuss any questions you have with your health care provider.   Iliotibial Band Syndrome Rehab Ask your health care provider which exercises are safe for you. Do exercises exactly as told by your health care provider and adjust them as directed. It is normal to feel mild stretching, pulling, tightness, or discomfort as you do these exercises, but you should stop right away if you feel sudden pain or your pain gets worse.Do not begin these exercises until told by your health care provider. Stretching and range of motion exercises These exercises warm up your muscles and joints and improve the movement and flexibility of your hip and pelvis. Exercise A: Quadriceps, prone  1. Lie on your abdomen on a firm surface, such as a bed or padded floor. 2. Bend your left / right knee and hold your ankle. If you cannot reach your ankle or pant leg, loop a belt around your foot and grab the belt instead. 3. Gently pull your heel toward your buttocks. Your knee should not slide out to the  side. You should feel a stretch in the front of your thigh and knee. 4. Hold this position for __________ seconds. Repeat __________ times. Complete this stretch __________ times a day. Exercise B: Iliotibial band  1. Lie on your side with your left / right leg in the top position. 2. Bend both of your knees and grab your left / right ankle. Stretch out your bottom arm to help you balance. 3. Slowly bring your top knee back so your thigh goes behind  your trunk. 4. Slowly lower your top leg toward the floor until you feel a gentle stretch on the outside of your left / right hip and thigh. If you do not feel a stretch and your knee will not fall farther, place the heel of your other foot on top of your knee and pull your knee down toward the floor with your foot. 5. Hold this position for __________ seconds. Repeat __________ times. Complete this stretch __________ times a day. Strengthening exercises These exercises build strength and endurance in your hip and pelvis. Endurance is the ability to use your muscles for a long time, even after they get tired. Exercise C: Straight leg raises (hip abductors)  1. Lie on your side with your left / right leg in the top position. Lie so your head, shoulder, knee, and hip line up. You may bend your bottom knee to help you balance. 2. Roll your hips slightly forward so your hips are stacked directly over each other and your left / right knee is facing forward. 3. Tense the muscles in your outer thigh and lift your top leg 4-6 inches (10-15 cm). 4. Hold this position for __________ seconds. 5. Slowly return to the starting position. Let your muscles relax completely before doing another repetition. Repeat __________ times. Complete this exercise __________ times a day. Exercise D: Straight leg raises (hip extensors) 1. Lie on your abdomen on your bed or a firm surface. You can put a pillow under your hips if that is more comfortable. 2. Bend your left / right knee so your foot is straight up in the air. 3. Squeeze your buttock muscles and lift your left / right thigh off the bed. Do not let your back arch. 4. Tense this muscle as hard as you can without increasing any knee pain. 5. Hold this position for __________ seconds. 6. Slowly lower your leg to the starting position and allow it to relax completely. Repeat __________ times. Complete this exercise __________ times a day. Exercise E: Hip  hike 1. Stand sideways on a bottom step. Stand on your left / right leg with your other foot unsupported next to the step. You can hold onto the railing or wall if needed for balance. 2. Keep your knees straight and your torso square. Then, lift your left / right hip up toward the ceiling. 3. Slowly let your left / right hip lower toward the floor, past the starting position. Your foot should get closer to the floor. Do not lean or bend your knees. Repeat __________ times. Complete this exercise __________ times a day. This information is not intended to replace advice given to you by your health care provider. Make sure you discuss any questions you have with your health care provider. Document Released: 05/24/2005 Document Revised: 01/27/2016 Document Reviewed: 04/25/2015 Elsevier Interactive Patient Education  2019 [employer] Released: 06/14/2014 Document Reviewed: 06/14/2014 Elsevier Interactive Patient Education [employer].

## 2018-08-02 NOTE — Progress Notes (Signed)
Patient: Lori [name] Female    DOB: 24-Nov-1975   43 y.o.   MRN: [id number] Visit Date: 08/02/2018  Today's Provider: Mar Daring, PA-C   Chief Complaint  Patient presents with  . Hip Pain   Subjective:     HPI  Patient here today with c/o hip pain. Pain radiates to her buttocks down her leg. Associated symptoms: Right lower leg pain.She reports that is a constant dull ache. Reports that is aggravated with different position. Patient was seen back in December and was treated for Bursitis. Tried: Ibuprofen 800 mg, heat, prednisone taper. Patient reports that it ease off a little from when she was seen in December but never got better and now is worse.  Allergies  Allergen Reactions  . Dapagliflozin Other (See Comments)    Wilder Glade) UTI  . Liraglutide Nausea Only    Victoza     Current Outpatient Medications:  .  ALPRAZolam (XANAX) 0.5 MG tablet, TAKE 1 TABLET BY MOUTH AT BEDTIME AS NEEDED FOR ANXIETY AND 0.5 TAB DURING THE DAY FOR PANIC ATTACKS, Disp: 45 tablet, Rfl: 5 .  atorvastatin (LIPITOR) 20 MG tablet, Take 20 mg by mouth at bedtime., Disp: , Rfl:  .  BD PEN NEEDLE NANO U/F 32G X 4 MM MISC, USE 1 TWICE A DAY, Disp: , Rfl: 10 .  CONTOUR NEXT TEST test strip, USE 2 TIMES DAILY TO TEST BLOOD SUGAR, Disp: , Rfl: 12 .  empagliflozin (JARDIANCE) 25 MG TABS tablet, Take by mouth., Disp: , Rfl:  .  esomeprazole (NEXIUM) 40 MG capsule, TAKE 1 CAPSULE BY MOUTH EVERY DAY, Disp: 90 capsule, Rfl: 3 .  levonorgestrel (MIRENA) 20 MCG/24HR IUD, 1 each by Intrauterine route once., Disp: , Rfl:  .  levothyroxine (SYNTHROID, LEVOTHROID) 112 MCG tablet, TAKE 1 TABLET EVERY DAY ON AN EMPTY STOMACH WITH A GLASS OF WATER AT LEAST 30-60 MINS BEFORE BFAST, Disp: , Rfl: 3 .  lisinopril (PRINIVIL,ZESTRIL) 5 MG tablet, Take 5 mg by mouth at bedtime. , Disp: , Rfl:  .  metFORMIN (GLUCOPHAGE-XR) 500 MG 24 hr tablet, Take by mouth., Disp: , Rfl:  .  metoprolol succinate (TOPROL-XL) 25 MG  24 hr tablet, Take 37.5 mg by mouth daily. , Disp: , Rfl:  .  NOVOLOG MIX 70/30 FLEXPEN (70-30) 100 UNIT/ML FlexPen, Inject 50 Units into the skin 2 (two) times daily. , Disp: , Rfl: 0 .  sertraline (ZOLOFT) 50 MG tablet, TAKE 1 TABLET BY MOUTH EVERYDAY AT BEDTIME, Disp: 90 tablet, Rfl: 3  Review of Systems  Constitutional: Negative.   Respiratory: Negative.   Cardiovascular: Negative for chest pain, palpitations and leg swelling.  Gastrointestinal: Negative.   Musculoskeletal: Positive for back pain, gait problem and myalgias.       Hip pain  Neurological: Positive for numbness. Negative for weakness.    Social History   Tobacco Use  . Smoking status: Former Smoker    Packs/day: 0.25    Years: 10.00    Pack years: 2.50    Types: Cigarettes    Last attempt to quit: 03/10/2016    Years since quitting: 2.3  . Smokeless tobacco: Never Used  Substance Use Topics  . Alcohol use: Yes    Alcohol/week: 2.0 standard drinks    Types: 2 Glasses of wine per week    Comment: occasional      Objective:   BP 100/67 (BP Location: Left Arm, Patient Position: Sitting, Cuff Size: Large)  Pulse 72   Temp 98.2 F (36.8 C)   Resp 16   Wt 157 lb (71.2 kg)   BMI 25.34 kg/m  Vitals:   08/02/18 1656  BP: 100/67  Pulse: 72  Resp: 16  Temp: 98.2 F (36.8 C)  Weight: 157 lb (71.2 kg)     Physical Exam Vitals signs reviewed.  Constitutional:      Appearance: She is well-developed.  HENT:     Head: Normocephalic and atraumatic.  Neck:     Musculoskeletal: Normal range of motion and neck supple.  Pulmonary:     Effort: Pulmonary effort is normal. No respiratory distress.  Musculoskeletal:     Right hip: She exhibits tenderness (over greater trochanter) and bony tenderness. She exhibits normal range of motion and normal strength.  Psychiatric:        Behavior: Behavior normal.        Thought Content: Thought content normal.        Judgment: Judgment normal.        Assessment &  Plan    1. Trochanteric bursitis of right hip Suspect bursitis with IT band syndrome of the right hip. Steroid injection given today in the office, see procedure note below. May start Penelope as below tomorrow. After care instructions printed for patient. Call if worsening. May require referral to ortho vs PT.  - methylPREDNISolone acetate (DEPO-MEDROL) injection 80 mg - etodolac (LODINE) 500 MG tablet; Take 1 tablet (500 mg total) by mouth 2 (two) times daily.  Dispense: 60 tablet; Refill: 1  2. Iliotibial band syndrome of right side See above medical treatment plan. - methylPREDNISolone acetate (DEPO-MEDROL) injection 80 mg - etodolac (LODINE) 500 MG tablet; Take 1 tablet (500 mg total) by mouth 2 (two) times daily.  Dispense: 60 tablet; Refill: 1  Procedure Note: Procedure Note: Benefits, risks (including infection, tattooing, adipose dimpling, and tendon rupture) and alternatives were explained to the patient. All questions were sought and answered.  Patient agreed to continue and verbal consent was obtained.   An aspiration and steroid injection was performed on right hip using 4cc of 1% plain Xyloocaine and 80 mg of depo-medrol. There was minimal bleeding. Hemostasis was intact. A dry dressing was applied. The procedure was well tolerated.     Mar Daring, PA-C  [facility]

## 2018-08-04 ENCOUNTER — Telehealth: Payer: Self-pay | Admitting: Physician Assistant

## 2018-08-04 DIAGNOSIS — M62838 Other muscle spasm: Secondary | ICD-10-CM

## 2018-08-04 MED ORDER — BACLOFEN 10 MG PO TABS: 10.0000 mg | ORAL_TABLET | Freq: Three times a day (TID) | ORAL | 0 refills | Status: DC

## 2018-08-04 NOTE — Telephone Encounter (Signed)
Will send in muscle relaxer. Normally those injections do not cause those symptoms.

## 2018-08-04 NOTE — Telephone Encounter (Signed)
Patient reports that she is having muscle tightness and spasms. Reports that she has been putting heat in the evening. She is asking if this is normal and what else can she do? She also reports that she still having a lot of pain in her hip.

## 2018-08-04 NOTE — Telephone Encounter (Signed)
Pt needing to ask a question on the injection she received the other day at her appt w/ Tawanna Sat.  Please call pt back to discuss asap.  Thanks, [employer]

## 2018-08-04 NOTE — Telephone Encounter (Signed)
Patient was advised.  

## 2018-08-08 DIAGNOSIS — I428 Other cardiomyopathies: Secondary | ICD-10-CM | POA: Diagnosis not present

## 2018-08-08 DIAGNOSIS — Z9189 Other specified personal risk factors, not elsewhere classified: Secondary | ICD-10-CM | POA: Diagnosis not present

## 2018-08-29 ENCOUNTER — Telehealth: Payer: Self-pay

## 2018-08-29 NOTE — Telephone Encounter (Signed)
Patient had called the [facility] call center and stated "that she is wanting to speak with Fenton Malling nurse regarding some medication she is on"  Called patient back and she reports that lately she has been feeling more depress, she has been sleeping a lot, staying in bed a lot and not showering. Reports that she is crying a lot and feeling very sad.  She is currently taking Zoloft 50 mg and Xanax prn.  Reports No suicidal ideation.

## 2018-08-29 NOTE — Telephone Encounter (Signed)
We can schedule her an evisit tomorrow.

## 2018-08-29 NOTE — Telephone Encounter (Signed)
LMTCB

## 2018-08-30 ENCOUNTER — Encounter: Payer: Self-pay | Admitting: Physician Assistant

## 2018-08-30 ENCOUNTER — Ambulatory Visit (INDEPENDENT_AMBULATORY_CARE_PROVIDER_SITE_OTHER): Payer: BLUE CROSS/BLUE SHIELD | Admitting: Physician Assistant

## 2018-08-30 DIAGNOSIS — F330 Major depressive disorder, recurrent, mild: Secondary | ICD-10-CM | POA: Diagnosis not present

## 2018-08-30 MED ORDER — SERTRALINE HCL 100 MG PO TABS: 100.0000 mg | ORAL_TABLET | Freq: Every day | ORAL | 1 refills | Status: DC

## 2018-08-30 NOTE — Progress Notes (Signed)
Virtual Visit via Video Note  I connected with Lori [name] on 08/30/18 at  1:20 PM EDT by a video enabled telemedicine application and verified that I am speaking with the correct person using two identifiers.   I discussed the limitations of evaluation and management by telemedicine and the availability of in person appointments. The patient expressed understanding and agreed to proceed.   Patient: Lori [name] Female    DOB: 1976-02-15   43 y.o.   MRN: [id number] Visit Date: 08/30/2018  Today's Provider: Mar Daring, PA-C   No chief complaint on file.  Subjective:     HPI  Depression, Follow-up  She  was last seen for this 1 years ago. Changes made at last visit include none, continue sertraline 50mg .   She reports excellent compliance with treatment. She is not having side effects.   She reports excellent tolerance of treatment. Current symptoms include: anhedonia, depressed mood, fatigue, hopelessness and hypersomnia She feels she is Worse since last visit.  ------------------------------------------------------------------------    Allergies  Allergen Reactions  . Dapagliflozin Other (See Comments)    Wilder Glade) UTI  . Liraglutide Nausea Only    Victoza     Current Outpatient Medications:  .  ALPRAZolam (XANAX) 0.5 MG tablet, TAKE 1 TABLET BY MOUTH AT BEDTIME AS NEEDED FOR ANXIETY AND 0.5 TAB DURING THE DAY FOR PANIC ATTACKS, Disp: 45 tablet, Rfl: 5 .  atorvastatin (LIPITOR) 20 MG tablet, Take 20 mg by mouth at bedtime., Disp: , Rfl:  .  baclofen (LIORESAL) 10 MG tablet, Take 1 tablet (10 mg total) by mouth 3 (three) times daily. (Patient not taking: Reported on 08/30/2018), Disp: 30 each, Rfl: 0 .  BD PEN NEEDLE NANO U/F 32G X 4 MM MISC, USE 1 TWICE A DAY, Disp: , Rfl: 10 .  CONTOUR NEXT TEST test strip, USE 2 TIMES DAILY TO TEST BLOOD SUGAR, Disp: , Rfl: 12 .  empagliflozin (JARDIANCE) 25 MG TABS tablet, Take by mouth., Disp: , Rfl:  .  esomeprazole  (NEXIUM) 40 MG capsule, TAKE 1 CAPSULE BY MOUTH EVERY DAY, Disp: 90 capsule, Rfl: 3 .  levonorgestrel (MIRENA) 20 MCG/24HR IUD, 1 each by Intrauterine route once., Disp: , Rfl:  .  levothyroxine (SYNTHROID, LEVOTHROID) 112 MCG tablet, TAKE 1 TABLET EVERY DAY ON AN EMPTY STOMACH WITH A GLASS OF WATER AT LEAST 30-60 MINS BEFORE BFAST, Disp: , Rfl: 3 .  lisinopril (PRINIVIL,ZESTRIL) 5 MG tablet, Take 5 mg by mouth at bedtime. , Disp: , Rfl:  .  metFORMIN (GLUCOPHAGE-XR) 500 MG 24 hr tablet, Take by mouth., Disp: , Rfl:  .  metoprolol succinate (TOPROL-XL) 25 MG 24 hr tablet, Take 37.5 mg by mouth daily. , Disp: , Rfl:  .  NOVOLOG MIX 70/30 FLEXPEN (70-30) 100 UNIT/ML FlexPen, Inject 50 Units into the skin 2 (two) times daily. , Disp: , Rfl: 0 .  sertraline (ZOLOFT) 50 MG tablet, TAKE 1 TABLET BY MOUTH EVERYDAY AT BEDTIME, Disp: 90 tablet, Rfl: 3  Review of Systems  Constitutional: Negative.   Respiratory: Negative.   Cardiovascular: Negative.   Gastrointestinal: Negative.   Psychiatric/Behavioral: Positive for decreased concentration, dysphoric mood and sleep disturbance (too much). The patient is nervous/anxious.     Social History   Tobacco Use  . Smoking status: Former Smoker    Packs/day: 0.25    Years: 10.00    Pack years: 2.50    Types: Cigarettes    Last attempt to quit: 03/10/2016  Years since quitting: 2.4  . Smokeless tobacco: Never Used  Substance Use Topics  . Alcohol use: Yes    Alcohol/week: 2.0 standard drinks    Types: 2 Glasses of wine per week    Comment: occasional      Objective:   There were no vitals taken for this visit. There were no vitals filed for this visit.   Physical Exam Vitals signs reviewed.  Constitutional:      Appearance: She is well-developed.  HENT:     Head: Normocephalic and atraumatic.  Neck:     Musculoskeletal: Normal range of motion and neck supple.  Pulmonary:     Effort: Pulmonary effort is normal. No respiratory distress.   Psychiatric:        Attention and Perception: Attention and perception normal.        Mood and Affect: Mood is depressed. Affect is flat.        Speech: Speech normal.        Behavior: Behavior normal.        Thought Content: Thought content normal.        Judgment: Judgment normal.       Assessment & Plan    1. Mild episode of recurrent major depressive disorder ([place]) Worsening symptoms expected to be from [place] 19 pandemic and uncertainty with her job. She has been stable on sertraline 50mg  for a while. Will increase to sertraline 100mg  as below. Follow up via mychart or evisit in 4 weeks.  - sertraline (ZOLOFT) 100 MG tablet; Take 1 tablet (100 mg total) by mouth daily.  Dispense: 90 tablet; Refill: 1    I discussed the assessment and treatment plan with the patient. The patient was provided an opportunity to ask questions and all were answered. The patient agreed with the plan and demonstrated an understanding of the instructions.   The patient was advised to call back or seek an in-person evaluation if the symptoms worsen or if the condition fails to improve as anticipated.  I provided 20 minutes of non-face-to-face time during this encounter.  Mar Daring, PA-C  [facility]

## 2018-09-12 ENCOUNTER — Encounter: Payer: Self-pay | Admitting: Physician Assistant

## 2018-09-12 DIAGNOSIS — F5101 Primary insomnia: Secondary | ICD-10-CM

## 2018-09-13 DIAGNOSIS — E039 Hypothyroidism, unspecified: Secondary | ICD-10-CM | POA: Diagnosis not present

## 2018-09-13 DIAGNOSIS — Z794 Long term (current) use of insulin: Secondary | ICD-10-CM | POA: Diagnosis not present

## 2018-09-13 DIAGNOSIS — E1165 Type 2 diabetes mellitus with hyperglycemia: Secondary | ICD-10-CM | POA: Diagnosis not present

## 2018-09-13 MED ORDER — TRAZODONE HCL 50 MG PO TABS: 50.0000 mg | ORAL_TABLET | Freq: Every evening | ORAL | 0 refills | Status: DC | PRN

## 2018-09-13 NOTE — Addendum Note (Signed)
Addended by: Mar Daring on: 09/13/2018 08:48 AM   Modules accepted: Orders

## 2018-09-21 ENCOUNTER — Encounter: Payer: Self-pay | Admitting: Physician Assistant

## 2018-09-28 DIAGNOSIS — C8110 Nodular sclerosis classical Hodgkin lymphoma, unspecified site: Secondary | ICD-10-CM | POA: Diagnosis not present

## 2018-09-28 DIAGNOSIS — Z1159 Encounter for screening for other viral diseases: Secondary | ICD-10-CM | POA: Diagnosis not present

## 2018-09-28 DIAGNOSIS — Z01812 Encounter for preprocedural laboratory examination: Secondary | ICD-10-CM | POA: Diagnosis not present

## 2018-09-29 ENCOUNTER — Other Ambulatory Visit: Payer: Self-pay | Admitting: Physician Assistant

## 2018-09-29 ENCOUNTER — Encounter: Payer: Self-pay | Admitting: Physician Assistant

## 2018-09-29 DIAGNOSIS — F411 Generalized anxiety disorder: Secondary | ICD-10-CM

## 2018-09-29 NOTE — Telephone Encounter (Signed)
L.O.V. 08/30/2018, please advise.

## 2018-10-01 ENCOUNTER — Other Ambulatory Visit: Payer: Self-pay | Admitting: Physician Assistant

## 2018-10-01 DIAGNOSIS — M7631 Iliotibial band syndrome, right leg: Secondary | ICD-10-CM

## 2018-10-01 DIAGNOSIS — M7061 Trochanteric bursitis, right hip: Secondary | ICD-10-CM

## 2018-10-02 DIAGNOSIS — Z79899 Other long term (current) drug therapy: Secondary | ICD-10-CM | POA: Diagnosis not present

## 2018-10-02 DIAGNOSIS — K635 Polyp of colon: Secondary | ICD-10-CM | POA: Diagnosis not present

## 2018-10-02 DIAGNOSIS — D122 Benign neoplasm of ascending colon: Secondary | ICD-10-CM | POA: Diagnosis not present

## 2018-10-02 DIAGNOSIS — Z9889 Other specified postprocedural states: Secondary | ICD-10-CM | POA: Diagnosis not present

## 2018-10-02 DIAGNOSIS — K5730 Diverticulosis of large intestine without perforation or abscess without bleeding: Secondary | ICD-10-CM | POA: Diagnosis not present

## 2018-10-02 DIAGNOSIS — E890 Postprocedural hypothyroidism: Secondary | ICD-10-CM | POA: Diagnosis not present

## 2018-10-02 DIAGNOSIS — E119 Type 2 diabetes mellitus without complications: Secondary | ICD-10-CM | POA: Diagnosis not present

## 2018-10-02 DIAGNOSIS — K648 Other hemorrhoids: Secondary | ICD-10-CM | POA: Diagnosis not present

## 2018-10-02 DIAGNOSIS — Z9221 Personal history of antineoplastic chemotherapy: Secondary | ICD-10-CM | POA: Diagnosis not present

## 2018-10-02 DIAGNOSIS — I5022 Chronic systolic (congestive) heart failure: Secondary | ICD-10-CM | POA: Diagnosis not present

## 2018-10-02 DIAGNOSIS — Z923 Personal history of irradiation: Secondary | ICD-10-CM | POA: Diagnosis not present

## 2018-10-02 DIAGNOSIS — Z86010 Personal history of colonic polyps: Secondary | ICD-10-CM | POA: Diagnosis not present

## 2018-10-02 DIAGNOSIS — E785 Hyperlipidemia, unspecified: Secondary | ICD-10-CM | POA: Diagnosis not present

## 2018-10-02 DIAGNOSIS — Z793 Long term (current) use of hormonal contraceptives: Secondary | ICD-10-CM | POA: Diagnosis not present

## 2018-10-02 DIAGNOSIS — D369 Benign neoplasm, unspecified site: Secondary | ICD-10-CM | POA: Diagnosis not present

## 2018-10-02 DIAGNOSIS — Z8571 Personal history of Hodgkin lymphoma: Secondary | ICD-10-CM | POA: Diagnosis not present

## 2018-10-02 DIAGNOSIS — Z09 Encounter for follow-up examination after completed treatment for conditions other than malignant neoplasm: Secondary | ICD-10-CM | POA: Diagnosis not present

## 2018-10-02 DIAGNOSIS — D124 Benign neoplasm of descending colon: Secondary | ICD-10-CM | POA: Diagnosis not present

## 2018-10-02 DIAGNOSIS — D126 Benign neoplasm of colon, unspecified: Secondary | ICD-10-CM | POA: Diagnosis not present

## 2018-10-02 LAB — HM COLONOSCOPY

## 2018-10-03 ENCOUNTER — Encounter: Payer: Self-pay | Admitting: Physician Assistant

## 2018-10-11 ENCOUNTER — Encounter: Payer: Self-pay | Admitting: Physician Assistant

## 2018-10-11 DIAGNOSIS — F5101 Primary insomnia: Secondary | ICD-10-CM

## 2018-10-11 MED ORDER — ESZOPICLONE 3 MG PO TABS: 3.0000 mg | ORAL_TABLET | Freq: Every day | ORAL | 1 refills | Status: DC

## 2018-10-11 NOTE — Addendum Note (Signed)
Addended by: Mar Daring on: 10/11/2018 11:28 AM   Modules accepted: Orders

## 2018-10-18 DIAGNOSIS — T66XXXS Radiation sickness, unspecified, sequela: Secondary | ICD-10-CM | POA: Diagnosis not present

## 2018-10-18 DIAGNOSIS — N951 Menopausal and female climacteric states: Secondary | ICD-10-CM | POA: Insufficient documentation

## 2018-10-18 DIAGNOSIS — C8110 Nodular sclerosis classical Hodgkin lymphoma, unspecified site: Secondary | ICD-10-CM | POA: Diagnosis not present

## 2018-10-27 DIAGNOSIS — E119 Type 2 diabetes mellitus without complications: Secondary | ICD-10-CM | POA: Diagnosis not present

## 2018-10-27 LAB — HM DIABETES EYE EXAM

## 2018-10-31 ENCOUNTER — Encounter: Payer: Self-pay | Admitting: Physician Assistant

## 2018-11-04 ENCOUNTER — Other Ambulatory Visit: Payer: Self-pay | Admitting: Physician Assistant

## 2018-11-04 DIAGNOSIS — F5101 Primary insomnia: Secondary | ICD-10-CM

## 2018-11-06 ENCOUNTER — Ambulatory Visit: Payer: BLUE CROSS/BLUE SHIELD | Admitting: Obstetrics and Gynecology

## 2018-11-07 ENCOUNTER — Encounter: Payer: Self-pay | Admitting: Physician Assistant

## 2018-11-16 ENCOUNTER — Other Ambulatory Visit: Payer: Self-pay | Admitting: Physician Assistant

## 2018-11-16 DIAGNOSIS — K219 Gastro-esophageal reflux disease without esophagitis: Secondary | ICD-10-CM

## 2018-11-21 DIAGNOSIS — H02834 Dermatochalasis of left upper eyelid: Secondary | ICD-10-CM | POA: Diagnosis not present

## 2018-11-21 DIAGNOSIS — H02403 Unspecified ptosis of bilateral eyelids: Secondary | ICD-10-CM | POA: Diagnosis not present

## 2018-11-21 DIAGNOSIS — H02831 Dermatochalasis of right upper eyelid: Secondary | ICD-10-CM | POA: Diagnosis not present

## 2018-12-05 ENCOUNTER — Encounter: Payer: Self-pay | Admitting: Physician Assistant

## 2018-12-05 DIAGNOSIS — M62830 Muscle spasm of back: Secondary | ICD-10-CM

## 2018-12-05 MED ORDER — CYCLOBENZAPRINE HCL 5 MG PO TABS: 5.0000 mg | ORAL_TABLET | Freq: Three times a day (TID) | ORAL | 1 refills | Status: DC | PRN

## 2018-12-05 NOTE — Addendum Note (Signed)
Addended by: Mar Daring on: 12/05/2018 01:53 PM   Modules accepted: Orders

## 2019-01-03 ENCOUNTER — Encounter: Payer: Self-pay | Admitting: Physician Assistant

## 2019-01-08 DIAGNOSIS — H02403 Unspecified ptosis of bilateral eyelids: Secondary | ICD-10-CM | POA: Diagnosis not present

## 2019-01-08 DIAGNOSIS — Z1159 Encounter for screening for other viral diseases: Secondary | ICD-10-CM | POA: Diagnosis not present

## 2019-01-08 DIAGNOSIS — Z01818 Encounter for other preprocedural examination: Secondary | ICD-10-CM | POA: Diagnosis not present

## 2019-01-10 DIAGNOSIS — I110 Hypertensive heart disease with heart failure: Secondary | ICD-10-CM | POA: Diagnosis not present

## 2019-01-10 DIAGNOSIS — H02403 Unspecified ptosis of bilateral eyelids: Secondary | ICD-10-CM | POA: Diagnosis not present

## 2019-01-10 DIAGNOSIS — E119 Type 2 diabetes mellitus without complications: Secondary | ICD-10-CM | POA: Diagnosis not present

## 2019-01-10 DIAGNOSIS — H02834 Dermatochalasis of left upper eyelid: Secondary | ICD-10-CM | POA: Diagnosis not present

## 2019-01-10 DIAGNOSIS — Z9221 Personal history of antineoplastic chemotherapy: Secondary | ICD-10-CM | POA: Diagnosis not present

## 2019-01-10 DIAGNOSIS — F329 Major depressive disorder, single episode, unspecified: Secondary | ICD-10-CM | POA: Diagnosis not present

## 2019-01-10 DIAGNOSIS — F17210 Nicotine dependence, cigarettes, uncomplicated: Secondary | ICD-10-CM | POA: Diagnosis not present

## 2019-01-10 DIAGNOSIS — K219 Gastro-esophageal reflux disease without esophagitis: Secondary | ICD-10-CM | POA: Diagnosis not present

## 2019-01-10 DIAGNOSIS — Z923 Personal history of irradiation: Secondary | ICD-10-CM | POA: Diagnosis not present

## 2019-01-10 DIAGNOSIS — I2510 Atherosclerotic heart disease of native coronary artery without angina pectoris: Secondary | ICD-10-CM | POA: Diagnosis not present

## 2019-01-10 DIAGNOSIS — H02831 Dermatochalasis of right upper eyelid: Secondary | ICD-10-CM | POA: Diagnosis not present

## 2019-01-10 DIAGNOSIS — E785 Hyperlipidemia, unspecified: Secondary | ICD-10-CM | POA: Diagnosis not present

## 2019-01-10 DIAGNOSIS — H05403 Unspecified enophthalmos, bilateral: Secondary | ICD-10-CM | POA: Diagnosis not present

## 2019-01-10 DIAGNOSIS — H02413 Mechanical ptosis of bilateral eyelids: Secondary | ICD-10-CM | POA: Diagnosis not present

## 2019-01-10 DIAGNOSIS — E890 Postprocedural hypothyroidism: Secondary | ICD-10-CM | POA: Diagnosis not present

## 2019-01-10 DIAGNOSIS — I509 Heart failure, unspecified: Secondary | ICD-10-CM | POA: Diagnosis not present

## 2019-01-10 DIAGNOSIS — Z794 Long term (current) use of insulin: Secondary | ICD-10-CM | POA: Diagnosis not present

## 2019-01-10 DIAGNOSIS — Z8571 Personal history of Hodgkin lymphoma: Secondary | ICD-10-CM | POA: Diagnosis not present

## 2019-02-26 ENCOUNTER — Other Ambulatory Visit: Payer: Self-pay | Admitting: Physician Assistant

## 2019-02-26 DIAGNOSIS — F330 Major depressive disorder, recurrent, mild: Secondary | ICD-10-CM

## 2019-02-26 DIAGNOSIS — M62830 Muscle spasm of back: Secondary | ICD-10-CM

## 2019-04-01 ENCOUNTER — Other Ambulatory Visit: Payer: Self-pay | Admitting: Physician Assistant

## 2019-04-01 DIAGNOSIS — F411 Generalized anxiety disorder: Secondary | ICD-10-CM

## 2019-04-05 DIAGNOSIS — E1165 Type 2 diabetes mellitus with hyperglycemia: Secondary | ICD-10-CM | POA: Diagnosis not present

## 2019-04-05 DIAGNOSIS — Z794 Long term (current) use of insulin: Secondary | ICD-10-CM | POA: Diagnosis not present

## 2019-04-12 DIAGNOSIS — E1165 Type 2 diabetes mellitus with hyperglycemia: Secondary | ICD-10-CM | POA: Diagnosis not present

## 2019-04-12 DIAGNOSIS — E782 Mixed hyperlipidemia: Secondary | ICD-10-CM | POA: Diagnosis not present

## 2019-04-12 DIAGNOSIS — I10 Essential (primary) hypertension: Secondary | ICD-10-CM | POA: Diagnosis not present

## 2019-04-12 DIAGNOSIS — E039 Hypothyroidism, unspecified: Secondary | ICD-10-CM | POA: Diagnosis not present

## 2019-04-16 ENCOUNTER — Ambulatory Visit (INDEPENDENT_AMBULATORY_CARE_PROVIDER_SITE_OTHER): Payer: BC Managed Care – PPO

## 2019-04-16 ENCOUNTER — Ambulatory Visit (INDEPENDENT_AMBULATORY_CARE_PROVIDER_SITE_OTHER): Payer: BC Managed Care – PPO | Admitting: Obstetrics and Gynecology

## 2019-04-16 ENCOUNTER — Encounter: Payer: Self-pay | Admitting: Obstetrics and Gynecology

## 2019-04-16 ENCOUNTER — Other Ambulatory Visit: Payer: Self-pay

## 2019-04-16 VITALS — BP 120/70 | HR 122 | Ht 66.0 in | Wt 154.0 lb

## 2019-04-16 DIAGNOSIS — R102 Pelvic and perineal pain: Secondary | ICD-10-CM | POA: Diagnosis not present

## 2019-04-16 DIAGNOSIS — N898 Other specified noninflammatory disorders of vagina: Secondary | ICD-10-CM

## 2019-04-16 DIAGNOSIS — N83202 Unspecified ovarian cyst, left side: Secondary | ICD-10-CM | POA: Diagnosis not present

## 2019-04-16 MED ORDER — TRAMADOL HCL 50 MG PO TABS: 50.0000 mg | ORAL_TABLET | Freq: Four times a day (QID) | ORAL | 0 refills | Status: DC | PRN

## 2019-04-16 NOTE — Progress Notes (Signed)
Patient ID: Lori [name], female   DOB: 09/04/1975, 43 y.o.   MRN: [id number]  Reason for Consult: Pelvic Pain (Pain started saturday night, LLQ sharp/dull pain)   Referred by Mar Daring, P*  Subjective:     HPI:  Lori [name] is a 43 y.o. female she presents today with complaints of sharp left lower abdominal pain. She woke up with the pain Saturday morning she had the pain throughout the day Saturday, Sunday and today. She took motrin and used a heating pad although this had not aleviated her pain. It hurts to walk, to the touch and to stretch. She reports no changes in bowel movments, had a normal BM yesterday. No constipation, no nausea, some decreased appetite. No vaginal bleeding. Some yellowish discharge. She also has been having increasingly worse stress incontinence or leakage of urine with cough, laugh sneeze.  Sexually active with husband.  Reports recent colonoscopy with polyps removed.  Has an IUD which has been in place for 2 years. No bleeding since insertion.   Past Medical History:  Diagnosis Date  . Bacterial vaginitis   . CHF (congestive heart failure) ([place])   . Complication of anesthesia    nausea/vomiting  . Depression   . Diabetes mellitus without complication ([place])   . History of abnormal mammogram 2013   @[facility] - NEG MRI AND BX OCT 2014 NORMAL  . History of mammogram 03/2015   BREAST MRI AT [facility] WNL  . History of Papanicolaou smear of cervix 10/08/2013; 08/04/15   -/-; ASCUS/HPV NEG;  . Hodgkin's disease ([place]) 1987   She had surgical resection of Lymph nodes and chemo + rad tx's.  Marland Kitchen Hypertension   . Hypothyroidism    2ND TO [place] TX  . PONV (postoperative nausea and vomiting)    Family History  Problem Relation Age of Onset  . Hypertension Mother    Past Surgical History:  Procedure Laterality Date  . CHOLECYSTECTOMY    . DEEP NECK LYMPH NODE BIOPSY / EXCISION  1987   hodgkin lymphoma  . DILATATION & CURETTAGE/HYSTEROSCOPY WITH MYOSURE N/A  03/10/2017   Procedure: DILATATION & CURETTAGE/HYSTEROSCOPY WITH MYOSURE;  Surgeon: Will Bonnet, MD;  Location: [facility] ORS;  Service: Gynecology;  Laterality: N/A;  . INTRAUTERINE DEVICE (IUD) INSERTION  [id number]; 02/06/09; 12/12/13  . INTRAUTERINE DEVICE (IUD) INSERTION N/A 03/10/2017   Procedure: INTRAUTERINE DEVICE (IUD) INSERTION;  Surgeon: Will Bonnet, MD;  Location: [facility] ORS;  Service: Gynecology;  Laterality: N/A;  . SPLENECTOMY    . THYROIDECTOMY      Short Social History:  Social History   Tobacco Use  . Smoking status: Former Smoker    Packs/day: 0.25    Years: 10.00    Pack years: 2.50    Types: Cigarettes    Quit date: 03/10/2016    Years since quitting: 3.1  . Smokeless tobacco: Never Used  Substance Use Topics  . Alcohol use: Yes    Alcohol/week: 2.0 standard drinks    Types: 2 Glasses of wine per week    Comment: occasional    Allergies  Allergen Reactions  . Dapagliflozin Other (See Comments)    Wilder Glade) UTI  . Liraglutide Nausea Only    Victoza    Current Outpatient Medications  Medication Sig Dispense Refill  . ALPRAZolam (XANAX) 0.5 MG tablet TAKE 1 TABLET BY MOUTH AT BEDTIME AS NEEDED FOR ANXIETY AND 1/2 TAB DURING THE DAY FOR PANIC ATTACKS 45 tablet 5  . atorvastatin (LIPITOR) 20  MG tablet Take 20 mg by mouth at bedtime.    . BD PEN NEEDLE NANO U/F 32G X 4 MM MISC USE 1 TWICE A DAY  10  . CONTOUR NEXT TEST test strip USE 2 TIMES DAILY TO TEST BLOOD SUGAR  12  . empagliflozin (JARDIANCE) 25 MG TABS tablet Take by mouth.    . esomeprazole (NEXIUM) 40 MG capsule TAKE 1 CAPSULE BY MOUTH EVERY DAY 90 capsule 3  . Eszopiclone 3 MG TABS Take 1 tablet (3 mg total) by mouth at bedtime. Take immediately before bedtime 30 tablet 1  . etodolac (LODINE) 500 MG tablet TAKE 1 TABLET BY MOUTH TWICE A DAY 60 tablet 1  . Lancets (ONETOUCH DELICA PLUS 123XX123) MISC USE LANCETS TO TEST BLOOD SUGARS THREE TIMES DAILY    . levonorgestrel (MIRENA) 20 MCG/24HR IUD  1 each by Intrauterine route once.    Marland Kitchen levothyroxine (SYNTHROID) 100 MCG tablet PLEASE SEE ATTACHED FOR DETAILED DIRECTIONS    . levothyroxine (SYNTHROID) 75 MCG tablet Take one tablet weekly on Sunday.Take on an empty stomach with a glass of water at least 30-60 minutes before breakfast.    . lisinopril (PRINIVIL,ZESTRIL) 5 MG tablet Take 5 mg by mouth at bedtime.     . metFORMIN (GLUCOPHAGE-XR) 500 MG 24 hr tablet Take 1,000 mg by mouth 2 (two) times daily.    . metoprolol succinate (TOPROL-XL) 25 MG 24 hr tablet Take 37.5 mg by mouth daily.     Marland Kitchen NOVOLOG MIX 70/30 FLEXPEN (70-30) 100 UNIT/ML FlexPen Inject 50 Units into the skin 2 (two) times daily.   0  . sertraline (ZOLOFT) 100 MG tablet TAKE 1 TABLET BY MOUTH EVERY DAY 90 tablet 1   No current facility-administered medications for this visit.     Review of Systems  Constitutional: Negative for chills, fatigue, fever and unexpected weight change.  HENT: Negative for trouble swallowing.  Eyes: Negative for loss of vision.  Respiratory: Negative for cough, shortness of breath and wheezing.  Cardiovascular: Negative for chest pain, leg swelling, palpitations and syncope.  GI: Negative for abdominal pain, blood in stool, diarrhea, nausea and vomiting.  GU: Negative for difficulty urinating, dysuria, frequency and hematuria.  Musculoskeletal: Negative for back pain, leg pain and joint pain.  Skin: Negative for rash.  Neurological: Negative for dizziness, headaches, light-headedness, numbness and seizures.  Psychiatric: Negative for behavioral problem, confusion, depressed mood and sleep disturbance.        Objective:  Objective   Vitals:   04/16/19 0916  BP: 120/70  Pulse: (!) 122  Weight: 154 lb (69.9 kg)  Height: 5\' 6"  (1.676 m)   Body mass index is 24.86 kg/m.  Physical Exam Vitals signs and nursing note reviewed.  Constitutional:      Appearance: She is well-developed.  HENT:     Head: Normocephalic and atraumatic.   Eyes:     Pupils: Pupils are equal, round, and reactive to light.  Cardiovascular:     Rate and Rhythm: Normal rate and regular rhythm.  Pulmonary:     Effort: Pulmonary effort is normal. No respiratory distress.  Abdominal:     General: Abdomen is flat. There is no distension.     Palpations: Abdomen is soft. There is no mass.     Tenderness: There is abdominal tenderness. There is no guarding or rebound.  Genitourinary:    Comments: External: Vulva normal. No lesions noted.  Speculum examination:  Cervix normal . No blood in the vaginal vault.  Scant white discharge.  IUD strings seen Bimanual examination: Uterus midline, nontender, no CMT. Left Adnexa tender to palpation, enlarged left ovary. Normal non tender right adnexa. Pelvis is not fixed.    Skin:    General: Skin is warm and dry.  Neurological:     Mental Status: She is alert and oriented to person, place, and time.  Psychiatric:        Behavior: Behavior normal.        Thought Content: Thought content normal.        Judgment: Judgment normal.        Assessment/Plan:     43 yo with left adnexal tenderness and pain Will follow up for Pelvic US later this morning.   UPDATE: Likely ruptured left ovarian cyst, conservative management with medications for pain control follow up in 6 weeks.  Tramadol, motrin, tylenol for pain PRN.  Precautions discussed.   More than 25 minutes were spent face to face with the patient in the room with more than 50% of the time spent providing counseling and discussing the plan of management.    Adrian Prows MD [facility] 04/16/2019 9:32 AM

## 2019-04-19 LAB — NUSWAB VAGINITIS PLUS (VG+)
Atopobium vaginae: HIGH Score — AB
Candida albicans, NAA: NEGATIVE
Candida glabrata, NAA: NEGATIVE
Chlamydia trachomatis, NAA: NEGATIVE
Megasphaera 1: HIGH Score — AB
Neisseria gonorrhoeae, NAA: NEGATIVE
Trich vag by NAA: NEGATIVE

## 2019-04-20 ENCOUNTER — Other Ambulatory Visit: Payer: Self-pay | Admitting: Obstetrics and Gynecology

## 2019-04-20 DIAGNOSIS — N760 Acute vaginitis: Secondary | ICD-10-CM

## 2019-04-20 DIAGNOSIS — B9689 Other specified bacterial agents as the cause of diseases classified elsewhere: Secondary | ICD-10-CM

## 2019-04-20 MED ORDER — CLINDAMYCIN PHOSPHATE 2 % VA CREA: 1.0000 | TOPICAL_CREAM | Freq: Every day | VAGINAL | 0 refills | Status: AC

## 2019-04-20 NOTE — Progress Notes (Signed)
Called and discussed with patient rx sent

## 2019-04-23 ENCOUNTER — Encounter: Payer: Self-pay | Admitting: Physician Assistant

## 2019-04-24 DIAGNOSIS — Z20828 Contact with and (suspected) exposure to other viral communicable diseases: Secondary | ICD-10-CM | POA: Diagnosis not present

## 2019-04-24 NOTE — Progress Notes (Deleted)
No chief complaint on file.    HPI:      [name] is a 43 y.o. [phone] who LMP was No LMP recorded. (Menstrual status: IUD)., presents today for her annual examination.  Her menses are absent with IUD. Pt had DUB 2018 with endometrial mass on u/s, so pt had hysteroscopy/D&C/polypectomy and new IUD placement with Dr. Glennon Mac 10/18. Doing well.   Sex activity: single partner, contraception - IUD. Mirena placed 03/10/17. Last Pap: 01/18/17 Results were: no abnormalities /neg HPV DNA  Hx of STDs: none  She complains of several yeast infections recently after starting jardiance for DM. Blood sugars improved but pt having to take diflucan frequently for sx. Last treated with diflucan and monistat-3 last wk but still feels irritated today. Has occas blood with wiping, as well as burning with urination/wiping.   Last mammogram: pt [facility] for hx of  Breast mass/bx. There is no FH of breast cancer. There is no FH of ovarian cancer. The patient does do self-breast exams.  Tobacco use: The patient denies current or previous tobacco use. Alcohol use: social Exercise: min active  She does get adequate calcium and Vitamin D in her diet.  She has labs with PCP.  Past Medical History:  Diagnosis Date  . Bacterial vaginitis   . CHF (congestive heart failure) ([place])   . Complication of anesthesia    nausea/vomiting  . Depression   . Diabetes mellitus without complication ([place])   . History of abnormal mammogram 2013   @[facility] - NEG MRI AND BX OCT 2014 NORMAL  . History of mammogram 03/2015   BREAST MRI AT [facility] WNL  . History of Papanicolaou smear of cervix 10/08/2013; 08/04/15   -/-; ASCUS/HPV NEG;  . Hodgkin's disease ([place]) 1987   She had surgical resection of Lymph nodes and chemo + rad tx's.  Marland Kitchen Hypertension   . Hypothyroidism    2ND TO [place] TX  . PONV (postoperative nausea and vomiting)     Past Surgical History:  Procedure Laterality Date  . CHOLECYSTECTOMY     . DEEP NECK LYMPH NODE BIOPSY / EXCISION  1987   hodgkin lymphoma  . DILATATION & CURETTAGE/HYSTEROSCOPY WITH MYOSURE N/A 03/10/2017   Procedure: DILATATION & CURETTAGE/HYSTEROSCOPY WITH MYOSURE;  Surgeon: Will Bonnet, MD;  Location: [facility] ORS;  Service: Gynecology;  Laterality: N/A;  . INTRAUTERINE DEVICE (IUD) INSERTION  [id number]; 02/06/09; 12/12/13  . INTRAUTERINE DEVICE (IUD) INSERTION N/A 03/10/2017   Procedure: INTRAUTERINE DEVICE (IUD) INSERTION;  Surgeon: Will Bonnet, MD;  Location: [facility] ORS;  Service: Gynecology;  Laterality: N/A;  . SPLENECTOMY    . THYROIDECTOMY      Family History  Problem Relation Age of Onset  . Hypertension Mother     Social History   Socioeconomic History  . Marital status: Married    Spouse name: Not on file  . Number of children: Not on file  . Years of education: 22  . Highest education level: Not on file  Occupational History  . Occupation: Insurance risk surveyor    Employer: [employer]  . Financial resource strain: Not on file  . Food insecurity    Worry: Not on file    Inability: Not on file  . Transportation needs    Medical: Not on file    Non-medical: Not on file  Tobacco Use  . Smoking status: Former Smoker    Packs/day: 0.25    Years: 10.00  Pack years: 2.50    Types: Cigarettes    Quit date: 03/10/2016    Years since quitting: 3.1  . Smokeless tobacco: Never Used  Substance and Sexual Activity  . Alcohol use: Yes    Alcohol/week: 2.0 standard drinks    Types: 2 Glasses of wine per week    Comment: occasional  . Drug use: No  . Sexual activity: Yes    Birth control/protection: I.U.D.    Comment: Mirena  Lifestyle  . Physical activity    Days per week: Not on file    Minutes per session: Not on file  . Stress: Not on file  Relationships  . Social Herbalist on phone: Not on file    Gets together: Not on file    Attends religious service: Not on file    Active member of club  or organization: Not on file    Attends meetings of clubs or organizations: Not on file    Relationship status: Not on file  . Intimate partner violence    Fear of current or ex partner: Not on file    Emotionally abused: Not on file    Physically abused: Not on file    Forced sexual activity: Not on file  Other Topics Concern  . Not on file  Social History Narrative  . Not on file     Current Outpatient Medications:  .  ALPRAZolam (XANAX) 0.5 MG tablet, TAKE 1 TABLET BY MOUTH AT BEDTIME AS NEEDED FOR ANXIETY AND 1/2 TAB DURING THE DAY FOR PANIC ATTACKS, Disp: 45 tablet, Rfl: 5 .  atorvastatin (LIPITOR) 20 MG tablet, Take 20 mg by mouth at bedtime., Disp: , Rfl:  .  BD PEN NEEDLE NANO U/F 32G X 4 MM MISC, USE 1 TWICE A DAY, Disp: , Rfl: 10 .  clindamycin (CLEOCIN) 2 % vaginal cream, Place 1 Applicatorful vaginally at bedtime for 7 days., Disp: 40 g, Rfl: 0 .  CONTOUR NEXT TEST test strip, USE 2 TIMES DAILY TO TEST BLOOD SUGAR, Disp: , Rfl: 12 .  empagliflozin (JARDIANCE) 25 MG TABS tablet, Take by mouth., Disp: , Rfl:  .  esomeprazole (NEXIUM) 40 MG capsule, TAKE 1 CAPSULE BY MOUTH EVERY DAY, Disp: 90 capsule, Rfl: 3 .  Eszopiclone 3 MG TABS, Take 1 tablet (3 mg total) by mouth at bedtime. Take immediately before bedtime, Disp: 30 tablet, Rfl: 1 .  etodolac (LODINE) 500 MG tablet, TAKE 1 TABLET BY MOUTH TWICE A DAY, Disp: 60 tablet, Rfl: 1 .  Lancets (ONETOUCH DELICA PLUS 123XX123) MISC, USE LANCETS TO TEST BLOOD SUGARS THREE TIMES DAILY, Disp: , Rfl:  .  levonorgestrel (MIRENA) 20 MCG/24HR IUD, 1 each by Intrauterine route once., Disp: , Rfl:  .  levothyroxine (SYNTHROID) 100 MCG tablet, PLEASE SEE ATTACHED FOR DETAILED DIRECTIONS, Disp: , Rfl:  .  levothyroxine (SYNTHROID) 75 MCG tablet, Take one tablet weekly on Sunday.Take on an empty stomach with a glass of water at least 30-60 minutes before breakfast., Disp: , Rfl:  .  lisinopril (PRINIVIL,ZESTRIL) 5 MG tablet, Take 5 mg by mouth  at bedtime. , Disp: , Rfl:  .  metFORMIN (GLUCOPHAGE-XR) 500 MG 24 hr tablet, Take 1,000 mg by mouth 2 (two) times daily., Disp: , Rfl:  .  metoprolol succinate (TOPROL-XL) 25 MG 24 hr tablet, Take 37.5 mg by mouth daily. , Disp: , Rfl:  .  NOVOLOG MIX 70/30 FLEXPEN (70-30) 100 UNIT/ML FlexPen, Inject 50 Units into the skin  2 (two) times daily. , Disp: , Rfl: 0 .  sertraline (ZOLOFT) 100 MG tablet, TAKE 1 TABLET BY MOUTH EVERY DAY, Disp: 90 tablet, Rfl: 1 .  traMADol (ULTRAM) 50 MG tablet, Take 1 tablet (50 mg total) by mouth every 6 (six) hours as needed., Disp: 20 tablet, Rfl: 0  ROS:  Review of Systems  Constitutional: Negative for fatigue, fever and unexpected weight change.  Respiratory: Negative for cough, shortness of breath and wheezing.   Cardiovascular: Negative for chest pain, palpitations and leg swelling.  Gastrointestinal: Negative for blood in stool, constipation, diarrhea, nausea and vomiting.  Endocrine: Negative for cold intolerance, heat intolerance and polyuria.  Genitourinary: Positive for vaginal bleeding. Negative for dyspareunia, dysuria, flank pain, frequency, genital sores, hematuria, menstrual problem, pelvic pain, urgency and vaginal discharge.  Musculoskeletal: Negative for back pain, joint swelling and myalgias.  Skin: Negative for rash.  Neurological: Negative for dizziness, syncope, light-headedness, numbness and headaches.  Hematological: Negative for adenopathy.  Psychiatric/Behavioral: Negative for agitation, confusion, sleep disturbance and suicidal ideas. The patient is not nervous/anxious.      Objective: There were no vitals taken for this visit.   Physical Exam Constitutional:      Appearance: She is well-developed.  Genitourinary:     Vagina and uterus normal.     No vaginal discharge, erythema or tenderness.     No cervical motion tenderness or polyp.     Uterus is not enlarged or tender.     No right or left adnexal mass present.      Right adnexa not tender.     Left adnexa not tender.  Neck:     Musculoskeletal: Normal range of motion.     Thyroid: No thyromegaly.  Cardiovascular:     Rate and Rhythm: Normal rate and regular rhythm.     Heart sounds: Normal heart sounds. No murmur.  Pulmonary:     Effort: Pulmonary effort is normal.     Breath sounds: Normal breath sounds.  Chest:     Breasts:        Right: No mass, nipple discharge, skin change or tenderness.        Left: No mass, nipple discharge, skin change or tenderness.  Abdominal:     Palpations: Abdomen is soft.     Tenderness: There is no abdominal tenderness. There is no guarding.  Musculoskeletal: Normal range of motion.  Neurological:     Mental Status: She is alert and oriented to person, place, and time.     Cranial Nerves: No cranial nerve deficit.  Psychiatric:        Behavior: Behavior normal.  Vitals signs reviewed.     Results: No results found for this or any previous visit (from the past 24 hour(s)).  Assessment/Plan: No diagnosis found.             GYN counsel adequate intake of calcium and vitamin D     F/U  No follow-ups on file.  Ho Parisi B. Dellie Piasecki, PA-C 04/24/2019 6:34 PM

## 2019-04-25 ENCOUNTER — Ambulatory Visit: Payer: BLUE CROSS/BLUE SHIELD | Admitting: Obstetrics and Gynecology

## 2019-04-27 ENCOUNTER — Encounter: Payer: Self-pay | Admitting: Physician Assistant

## 2019-04-27 DIAGNOSIS — F330 Major depressive disorder, recurrent, mild: Secondary | ICD-10-CM

## 2019-04-27 MED ORDER — SERTRALINE HCL 100 MG PO TABS: 200.0000 mg | ORAL_TABLET | Freq: Every day | ORAL | 1 refills | Status: DC

## 2019-04-28 ENCOUNTER — Encounter: Payer: Self-pay | Admitting: Physician Assistant

## 2019-04-30 ENCOUNTER — Telehealth: Payer: Self-pay

## 2019-04-30 NOTE — Telephone Encounter (Signed)
Patient was contacted in regards to the message sent about her Positive Covid test and instructions from this point. She stated that her symptoms started on 04/21/19 and she was recently tested with a positive results. She stated no complications at this time and all symptoms seemed to be improving. She was advised to continue to quarantine 14 days from symptom onset and could return to work on 05/05/19. She was also advised if she developed additional symptoms to contact our office or if sever go to the ED. She gave verbal understanding.

## 2019-05-10 ENCOUNTER — Telehealth: Payer: Self-pay | Admitting: Obstetrics and Gynecology

## 2019-05-10 NOTE — Telephone Encounter (Signed)
I called and left voicemail for patient to call back to let us know if she still wants to schedule ultrasound that was ordered not be never schedule.

## 2019-05-16 NOTE — Telephone Encounter (Signed)
Called and left voice mail for patient to call back to be schedule °

## 2019-05-26 ENCOUNTER — Encounter: Payer: Self-pay | Admitting: Physician Assistant

## 2019-05-26 DIAGNOSIS — R3989 Other symptoms and signs involving the genitourinary system: Secondary | ICD-10-CM

## 2019-05-28 MED ORDER — CEPHALEXIN 500 MG PO CAPS: 500.0000 mg | ORAL_CAPSULE | Freq: Two times a day (BID) | ORAL | 0 refills | Status: DC

## 2019-06-05 ENCOUNTER — Other Ambulatory Visit: Payer: Self-pay | Admitting: Physician Assistant

## 2019-06-05 DIAGNOSIS — M62830 Muscle spasm of back: Secondary | ICD-10-CM

## 2019-07-18 DIAGNOSIS — J984 Other disorders of lung: Secondary | ICD-10-CM | POA: Diagnosis not present

## 2019-07-18 DIAGNOSIS — F329 Major depressive disorder, single episode, unspecified: Secondary | ICD-10-CM | POA: Diagnosis not present

## 2019-07-18 DIAGNOSIS — N6311 Unspecified lump in the right breast, upper outer quadrant: Secondary | ICD-10-CM | POA: Diagnosis not present

## 2019-07-18 DIAGNOSIS — Z9221 Personal history of antineoplastic chemotherapy: Secondary | ICD-10-CM | POA: Insufficient documentation

## 2019-07-18 DIAGNOSIS — I5022 Chronic systolic (congestive) heart failure: Secondary | ICD-10-CM | POA: Diagnosis not present

## 2019-07-18 DIAGNOSIS — T66XXXS Radiation sickness, unspecified, sequela: Secondary | ICD-10-CM | POA: Diagnosis not present

## 2019-07-18 DIAGNOSIS — E119 Type 2 diabetes mellitus without complications: Secondary | ICD-10-CM | POA: Diagnosis not present

## 2019-07-18 DIAGNOSIS — W900XXS Exposure to radiofrequency, sequela: Secondary | ICD-10-CM | POA: Diagnosis not present

## 2019-07-18 DIAGNOSIS — N390 Urinary tract infection, site not specified: Secondary | ICD-10-CM | POA: Diagnosis not present

## 2019-07-18 DIAGNOSIS — I058 Other rheumatic mitral valve diseases: Secondary | ICD-10-CM | POA: Diagnosis not present

## 2019-07-18 DIAGNOSIS — Z9189 Other specified personal risk factors, not elsewhere classified: Secondary | ICD-10-CM | POA: Diagnosis not present

## 2019-07-18 DIAGNOSIS — N6001 Solitary cyst of right breast: Secondary | ICD-10-CM | POA: Diagnosis not present

## 2019-07-18 DIAGNOSIS — R928 Other abnormal and inconclusive findings on diagnostic imaging of breast: Secondary | ICD-10-CM | POA: Diagnosis not present

## 2019-07-18 DIAGNOSIS — Z1239 Encounter for other screening for malignant neoplasm of breast: Secondary | ICD-10-CM | POA: Diagnosis not present

## 2019-07-18 DIAGNOSIS — Z9081 Acquired absence of spleen: Secondary | ICD-10-CM | POA: Diagnosis not present

## 2019-07-18 DIAGNOSIS — C8110 Nodular sclerosis classical Hodgkin lymphoma, unspecified site: Secondary | ICD-10-CM | POA: Diagnosis not present

## 2019-08-21 DIAGNOSIS — Z9221 Personal history of antineoplastic chemotherapy: Secondary | ICD-10-CM | POA: Diagnosis not present

## 2019-08-21 DIAGNOSIS — I428 Other cardiomyopathies: Secondary | ICD-10-CM | POA: Diagnosis not present

## 2019-08-21 DIAGNOSIS — I5022 Chronic systolic (congestive) heart failure: Secondary | ICD-10-CM | POA: Diagnosis not present

## 2019-08-21 DIAGNOSIS — R000 Tachycardia, unspecified: Secondary | ICD-10-CM | POA: Diagnosis not present

## 2019-08-21 DIAGNOSIS — I519 Heart disease, unspecified: Secondary | ICD-10-CM | POA: Diagnosis not present

## 2019-08-21 DIAGNOSIS — Z8571 Personal history of Hodgkin lymphoma: Secondary | ICD-10-CM | POA: Diagnosis not present

## 2019-08-21 DIAGNOSIS — E039 Hypothyroidism, unspecified: Secondary | ICD-10-CM | POA: Diagnosis not present

## 2019-08-21 DIAGNOSIS — Z9189 Other specified personal risk factors, not elsewhere classified: Secondary | ICD-10-CM | POA: Diagnosis not present

## 2019-08-21 DIAGNOSIS — I340 Nonrheumatic mitral (valve) insufficiency: Secondary | ICD-10-CM | POA: Diagnosis not present

## 2019-08-21 DIAGNOSIS — Z79899 Other long term (current) drug therapy: Secondary | ICD-10-CM | POA: Diagnosis not present

## 2019-08-21 DIAGNOSIS — Z923 Personal history of irradiation: Secondary | ICD-10-CM | POA: Diagnosis not present

## 2019-08-21 DIAGNOSIS — Z87891 Personal history of nicotine dependence: Secondary | ICD-10-CM | POA: Diagnosis not present

## 2019-08-21 DIAGNOSIS — E890 Postprocedural hypothyroidism: Secondary | ICD-10-CM | POA: Diagnosis not present

## 2019-10-01 ENCOUNTER — Other Ambulatory Visit: Payer: Self-pay | Admitting: Physician Assistant

## 2019-10-01 DIAGNOSIS — M62830 Muscle spasm of back: Secondary | ICD-10-CM

## 2019-10-01 NOTE — Telephone Encounter (Signed)
Requested medication (s) are due for refill today: yes  Requested medication (s) are on the active medication list: yes  Last refill:  08/09/2019  Future visit scheduled: no  Notes to clinic:  this refill cannot be delegated    Requested Prescriptions  Pending Prescriptions Disp Refills   cyclobenzaprine (FLEXERIL) 5 MG tablet [Pharmacy Med Name: CYCLOBENZAPRINE 5 MG TABLET] 90 tablet 1    Sig: TAKE 1 TABLET BY MOUTH THREE TIMES A DAY AS NEEDED FOR MUSCLE SPASMS      Not Delegated - Analgesics:  Muscle Relaxants Failed - 10/01/2019 12:10 PM      Failed - This refill cannot be delegated      Failed - Valid encounter within last 6 months    Recent Outpatient Visits           1 year ago Mild episode of recurrent major depressive disorder [facility])   [place], [place]   1 year ago Trochanteric bursitis of right hip   [facility] [place], [place], [place]   1 year ago Sore throat   [facility] [place], [place]   1 year ago [place], [place]   1 year ago Right hip pain   [place], [place], [place]

## 2019-10-03 ENCOUNTER — Other Ambulatory Visit: Payer: Self-pay | Admitting: Physician Assistant

## 2019-10-03 DIAGNOSIS — F330 Major depressive disorder, recurrent, mild: Secondary | ICD-10-CM

## 2019-10-09 DIAGNOSIS — E782 Mixed hyperlipidemia: Secondary | ICD-10-CM | POA: Diagnosis not present

## 2019-10-09 DIAGNOSIS — E1165 Type 2 diabetes mellitus with hyperglycemia: Secondary | ICD-10-CM | POA: Diagnosis not present

## 2019-10-09 DIAGNOSIS — Z794 Long term (current) use of insulin: Secondary | ICD-10-CM | POA: Diagnosis not present

## 2019-10-09 DIAGNOSIS — E559 Vitamin D deficiency, unspecified: Secondary | ICD-10-CM | POA: Diagnosis not present

## 2019-10-09 LAB — HEMOGLOBIN A1C: Hemoglobin A1C: 7.3

## 2019-10-17 DIAGNOSIS — E1165 Type 2 diabetes mellitus with hyperglycemia: Secondary | ICD-10-CM | POA: Diagnosis not present

## 2019-10-17 DIAGNOSIS — E039 Hypothyroidism, unspecified: Secondary | ICD-10-CM | POA: Diagnosis not present

## 2019-10-17 DIAGNOSIS — E559 Vitamin D deficiency, unspecified: Secondary | ICD-10-CM | POA: Diagnosis not present

## 2019-10-17 DIAGNOSIS — Z794 Long term (current) use of insulin: Secondary | ICD-10-CM | POA: Diagnosis not present

## 2019-10-17 DIAGNOSIS — E782 Mixed hyperlipidemia: Secondary | ICD-10-CM | POA: Diagnosis not present

## 2019-10-24 NOTE — Progress Notes (Signed)
Chief Complaint  Patient presents with  . Gynecologic Exam     HPI:      [name] is a 44 y.o. [id number] who LMP was No LMP recorded. (Menstrual status: IUD)., presents today for her annual examination.  Her menses are absent with IUD. Pt had DUB in past with endometrial mass on u/s, s/p hysteroscopy/D&C/polypectomy and new IUD placement with Dr. Glennon Mac 10/18. Doing well. No BTB/dysmen.  Sex activity: single partner, contraception - IUD. Mirena placed 03/10/17. Last Pap: 01/18/17 Results were: no abnormalities /neg HPV DNA  Hx of STDs: none  LLQ pain 11/20 with neg u/s except possible ruptured LT ovar cyst. IUD in correct location. Pt doing well. No more sx.  Has had issues of vaginal itching/burning with increased d/c for a few days. Sx started after being at the pool. Hx of frequent yeast vag due to DM. Uses scented soap/dryer sheets.   Last mammogram: pt gets mammos/MR[facility] at [facility] yearly for hx of  Breast mass/bx. Last mammo 07/28/19, Birads 3, breast MRI in 6 months for RT breast.  There is no FH of breast cancer. There is no FH of ovarian cancer. The patient does do self-breast exams.  Tobacco use: The patient denies current or previous tobacco use. Alcohol use: social No drug use Exercise: mod active  She does get adequate calcium but not Vitamin D in her diet. Recently diagnosed with Vit D deficiency.  She has labs with PCP.  Past Medical History:  Diagnosis Date  . Bacterial vaginitis   . CHF (congestive heart failure) ([place])   . Complication of anesthesia    nausea/vomiting  . Depression   . Diabetes mellitus without complication ([place])   . History of abnormal mammogram 2013   @[facility] - NEG MRI AND BX OCT 2014 NORMAL  . History of mammogram 03/2015   BREAST MRI AT [facility] WNL  . History of Papanicolaou smear of cervix 10/08/2013; 08/04/15   -/-; ASCUS/HPV NEG;  . Hodgkin's disease ([place]) 1987   She had surgical resection of Lymph nodes and chemo + rad tx's.  Marland Kitchen  Hypertension   . Hypothyroidism    2ND TO CA TX  . PONV (postoperative nausea and vomiting)   . Vitamin D deficiency     Past Surgical History:  Procedure Laterality Date  . CHOLECYSTECTOMY    . DEEP NECK LYMPH NODE BIOPSY / EXCISION  1987   hodgkin lymphoma  . DILATATION & CURETTAGE/HYSTEROSCOPY WITH MYOSURE N/A 03/10/2017   Procedure: DILATATION & CURETTAGE/HYSTEROSCOPY WITH MYOSURE;  Surgeon: Will Bonnet, MD;  Location: [facility] ORS;  Service: Gynecology;  Laterality: N/A;  . INTRAUTERINE DEVICE (IUD) INSERTION  [id number]; 02/06/09; 12/12/13  . INTRAUTERINE DEVICE (IUD) INSERTION N/A 03/10/2017   Procedure: INTRAUTERINE DEVICE (IUD) INSERTION;  Surgeon: Will Bonnet, MD;  Location: [facility] ORS;  Service: Gynecology;  Laterality: N/A;  . SPLENECTOMY    . THYROIDECTOMY      Family History  Problem Relation Age of Onset  . Hypertension Mother     Social History   Socioeconomic History  . Marital status: Married    Spouse name: Not on file  . Number of children: Not on file  . Years of education: 89  . Highest education level: Not on file  Occupational History  . Occupation: QUOTATION SPEC    Employer: [employer]  Tobacco Use  . Smoking status: Former Smoker    Packs/day: 0.25    Years: 10.00    Pack years:  2.50    Types: Cigarettes    Quit date: 03/10/2016    Years since quitting: 3.6  . Smokeless tobacco: Never Used  Substance and Sexual Activity  . Alcohol use: Yes    Alcohol/week: 2.0 standard drinks    Types: 2 Glasses of wine per week    Comment: occasional  . Drug use: No  . Sexual activity: Yes    Birth control/protection: I.U.D.    Comment: Mirena  Other Topics Concern  . Not on file  Social History Narrative  . Not on file   Social Determinants of Health   Financial Resource Strain:   . Difficulty of Paying Living Expenses:   Food Insecurity:   . Worried About Charity fundraiser in the Last Year:   . Arboriculturist in the Last Year:    Transportation Needs:   . Film/video editor (Medical):   Marland Kitchen Lack of Transportation (Non-Medical):   Physical Activity:   . Days of Exercise per Week:   . Minutes of Exercise per Session:   Stress:   . Feeling of Stress :   Social Connections:   . Frequency of Communication with Friends and Family:   . Frequency of Social Gatherings with Friends and Family:   . Attends Religious Services:   . Active Member of Clubs or Organizations:   . Attends Archivist Meetings:   Marland Kitchen Marital Status:   Intimate Partner Violence:   . Fear of Current or Ex-Partner:   . Emotionally Abused:   Marland Kitchen Physically Abused:   . Sexually Abused:      Current Outpatient Medications:  .  ALPRAZolam (XANAX) 0.5 MG tablet, TAKE 1 TABLET BY MOUTH AT BEDTIME AS NEEDED FOR ANXIETY AND 1/2 TAB DURING THE DAY FOR PANIC ATTACKS, Disp: 45 tablet, Rfl: 5 .  atorvastatin (LIPITOR) 20 MG tablet, Take 20 mg by mouth at bedtime., Disp: , Rfl:  .  empagliflozin (JARDIANCE) 25 MG TABS tablet, Take by mouth., Disp: , Rfl:  .  esomeprazole (NEXIUM) 40 MG capsule, TAKE 1 CAPSULE BY MOUTH EVERY DAY, Disp: 90 capsule, Rfl: 3 .  levonorgestrel (MIRENA) 20 MCG/24HR IUD, 1 each by Intrauterine route once., Disp: , Rfl:  .  levothyroxine (SYNTHROID) 75 MCG tablet, Take one tablet weekly on Sunday.Take on an empty stomach with a glass of water at least 30-60 minutes before breakfast., Disp: , Rfl:  .  lisinopril (PRINIVIL,ZESTRIL) 5 MG tablet, Take 5 mg by mouth at bedtime. , Disp: , Rfl:  .  metFORMIN (GLUCOPHAGE-XR) 500 MG 24 hr tablet, Take 1,000 mg by mouth 2 (two) times daily., Disp: , Rfl:  .  metoprolol succinate (TOPROL-XL) 25 MG 24 hr tablet, Take 37.5 mg by mouth daily. , Disp: , Rfl:  .  NOVOLOG MIX 70/30 FLEXPEN (70-30) 100 UNIT/ML FlexPen, Inject 50 Units into the skin 2 (two) times daily. , Disp: , Rfl: 0 .  sertraline (ZOLOFT) 100 MG tablet, Take 2 tablets (200 mg total) by mouth daily., Disp: 180 tablet, Rfl:  1 .  BD PEN NEEDLE NANO U/F 32G X 4 MM MISC, USE 1 TWICE A DAY, Disp: , Rfl: 10 .  cephALEXin (KEFLEX) 500 MG capsule, Take 1 capsule (500 mg total) by mouth 2 (two) times daily., Disp: 10 capsule, Rfl: 0 .  CONTOUR NEXT TEST test strip, USE 2 TIMES DAILY TO TEST BLOOD SUGAR, Disp: , Rfl: 12 .  cyclobenzaprine (FLEXERIL) 5 MG tablet, TAKE 1 TABLET BY MOUTH  THREE TIMES A DAY AS NEEDED FOR MUSCLE SPASMS (Patient not taking: Reported on 10/25/2019), Disp: 90 tablet, Rfl: 1 .  Eszopiclone 3 MG TABS, Take 1 tablet (3 mg total) by mouth at bedtime. Take immediately before bedtime, Disp: 30 tablet, Rfl: 1 .  etodolac (LODINE) 500 MG tablet, TAKE 1 TABLET BY MOUTH TWICE A DAY (Patient not taking: Reported on 10/25/2019), Disp: 60 tablet, Rfl: 1 .  Lancets (ONETOUCH DELICA PLUS 123XX123) MISC, USE LANCETS TO TEST BLOOD SUGARS THREE TIMES DAILY, Disp: , Rfl:  .  levothyroxine (SYNTHROID) 100 MCG tablet, PLEASE SEE ATTACHED FOR DETAILED DIRECTIONS, Disp: , Rfl:  .  terconazole (TERAZOL 3) 0.8 % vaginal cream, Place 1 applicator vaginally at bedtime for 3 days., Disp: 20 g, Rfl: 0 .  traMADol (ULTRAM) 50 MG tablet, Take 1 tablet (50 mg total) by mouth every 6 (six) hours as needed. (Patient not taking: Reported on 10/25/2019), Disp: 20 tablet, Rfl: 0  ROS:  Review of Systems  Constitutional: Negative for fatigue, fever and unexpected weight change.  Respiratory: Negative for cough, shortness of breath and wheezing.   Cardiovascular: Negative for chest pain, palpitations and leg swelling.  Gastrointestinal: Negative for blood in stool, constipation, diarrhea, nausea and vomiting.  Endocrine: Negative for cold intolerance, heat intolerance and polyuria.  Genitourinary: Positive for vaginal discharge. Negative for dyspareunia, dysuria, flank pain, frequency, genital sores, hematuria, menstrual problem, pelvic pain, urgency and vaginal bleeding.  Musculoskeletal: Negative for back pain, joint swelling and  myalgias.  Skin: Negative for rash.  Neurological: Negative for dizziness, syncope, light-headedness, numbness and headaches.  Hematological: Negative for adenopathy.  Psychiatric/Behavioral: Negative for agitation, confusion, sleep disturbance and suicidal ideas. The patient is not nervous/anxious.      Objective: BP 120/80   Ht 5\' 6"  (1.676 m)   Wt 160 lb (72.6 kg)   BMI 25.82 kg/m    Physical Exam Constitutional:      Appearance: She is well-developed.  Genitourinary:     Vulva, uterus, right adnexa and left adnexa normal.     No vulval lesion or tenderness noted.     Vaginal discharge present.     No vaginal erythema or tenderness.     No cervical motion tenderness or polyp.     IUD strings visualized.     Uterus is not enlarged or tender.     No right or left adnexal mass present.     Right adnexa not tender.     Left adnexa not tender.  Neck:     Thyroid: No thyromegaly.  Cardiovascular:     Rate and Rhythm: Normal rate and regular rhythm.     Heart sounds: Normal heart sounds. No murmur.  Pulmonary:     Effort: Pulmonary effort is normal.     Breath sounds: Normal breath sounds.  Chest:     Breasts:        Right: No mass, nipple discharge, skin change or tenderness.        Left: No mass, nipple discharge, skin change or tenderness.  Abdominal:     Palpations: Abdomen is soft.     Tenderness: There is no abdominal tenderness. There is no guarding.  Musculoskeletal:        General: Normal range of motion.     Cervical back: Normal range of motion.  Neurological:     General: No focal deficit present.     Mental Status: She is alert and oriented to person, place, and time.  Cranial Nerves: No cranial nerve deficit.  Skin:    General: Skin is warm and dry.  Psychiatric:        Mood and Affect: Mood normal.        Behavior: Behavior normal.        Thought Content: Thought content normal.        Judgment: Judgment normal.  Vitals reviewed.      Results: Results for orders placed or performed in visit on 10/25/19 (from the past 24 hour(s))  POCT Wet Prep with KOH     Status: Abnormal   Collection Time: 10/25/19  8:53 AM  Result Value Ref Range   Trichomonas, UA Negative    Clue Cells Wet Prep HPF POC neg    Epithelial Wet Prep HPF POC     Yeast Wet Prep HPF POC pos    Bacteria Wet Prep HPF POC     RBC Wet Prep HPF POC     WBC Wet Prep HPF POC     KOH Prep POC Negative Negative    Assessment/Plan: Encounter for annual routine gynecological examination  Encounter for routine checking of intrauterine contraceptive device (IUD); IUD in place. Due for removal 10/24.  Candidal vaginitis - Plan: POCT Wet Prep with KOH, terconazole (TERAZOL 3) 0.8 % vaginal cream; Pos sx/exam and wet prep. Rx terazol. F/u prn. Dove sens skin soap/no dryer sheets.  Meds ordered this encounter  Medications  . terconazole (TERAZOL 3) 0.8 % vaginal cream    Sig: Place 1 applicator vaginally at bedtime for 3 days.    Dispense:  20 g    Refill:  0    Order Specific Question:   Supervising Provider    Answer:   Gae Dry [id number]              GYN counsel adequate intake of calcium and vitamin D     F/U  Return in about 1 year (around 10/24/2020).  Koran Seabrook B. Ellanore Vanhook, PA-C 10/25/2019 8:54 AM

## 2019-10-25 ENCOUNTER — Other Ambulatory Visit: Payer: Self-pay

## 2019-10-25 ENCOUNTER — Ambulatory Visit (INDEPENDENT_AMBULATORY_CARE_PROVIDER_SITE_OTHER): Payer: BC Managed Care – PPO | Admitting: Obstetrics and Gynecology

## 2019-10-25 ENCOUNTER — Encounter: Payer: Self-pay | Admitting: Obstetrics and Gynecology

## 2019-10-25 VITALS — BP 120/80 | Ht 66.0 in | Wt 160.0 lb

## 2019-10-25 DIAGNOSIS — Z01419 Encounter for gynecological examination (general) (routine) without abnormal findings: Secondary | ICD-10-CM

## 2019-10-25 DIAGNOSIS — B373 Candidiasis of vulva and vagina: Secondary | ICD-10-CM

## 2019-10-25 DIAGNOSIS — B3731 Acute candidiasis of vulva and vagina: Secondary | ICD-10-CM

## 2019-10-25 DIAGNOSIS — Z30431 Encounter for routine checking of intrauterine contraceptive device: Secondary | ICD-10-CM | POA: Diagnosis not present

## 2019-10-25 LAB — POCT WET PREP WITH KOH
Clue Cells Wet Prep HPF POC: NEGATIVE
KOH Prep POC: NEGATIVE
Trichomonas, UA: NEGATIVE
Yeast Wet Prep HPF POC: POSITIVE

## 2019-10-25 MED ORDER — TERCONAZOLE 0.8 % VA CREA: 1.0000 | TOPICAL_CREAM | Freq: Every day | VAGINAL | 0 refills | Status: AC

## 2019-10-25 NOTE — Patient Instructions (Signed)
I value your feedback and entrusting us with your care. If you get a [facility] patient survey, I would appreciate you taking the time to let us know about your experience today. Thank you!  As of May 17, 2019, your lab results will be released to your MyChart immediately, before I even have a chance to see them. Please give me time to review them and contact you if there are any abnormalities. Thank you for your patience.  

## 2019-11-07 ENCOUNTER — Other Ambulatory Visit: Payer: Self-pay | Admitting: Physician Assistant

## 2019-11-07 MED ORDER — BD PEN NEEDLE NANO U/F 32G X 4 MM MISC: 10 refills | Status: DC

## 2019-11-07 NOTE — Telephone Encounter (Signed)
Pharmacy is requesting refills on Omeprazole and Pen Needles

## 2019-11-07 NOTE — Telephone Encounter (Signed)
I don't see Omeprazole on her medication list

## 2019-11-08 ENCOUNTER — Encounter: Payer: Self-pay | Admitting: Physician Assistant

## 2019-11-08 ENCOUNTER — Other Ambulatory Visit: Payer: Self-pay | Admitting: Physician Assistant

## 2019-11-08 ENCOUNTER — Telehealth: Payer: Self-pay | Admitting: Physician Assistant

## 2019-11-08 DIAGNOSIS — F411 Generalized anxiety disorder: Secondary | ICD-10-CM

## 2019-11-08 DIAGNOSIS — K649 Unspecified hemorrhoids: Secondary | ICD-10-CM

## 2019-11-08 MED ORDER — HYDROCORTISONE ACETATE 25 MG RE SUPP: 25.0000 mg | Freq: Two times a day (BID) | RECTAL | 0 refills | Status: DC

## 2019-11-08 NOTE — Telephone Encounter (Signed)
We have received refill request for omeprazole but we have her on esomeprazole (nexium) 40mg . If she is taking omeprazole (Prilosec) I can send in I just need to know which dose?

## 2019-11-08 NOTE — Addendum Note (Signed)
Addended by: Mar Daring on: 11/08/2019 01:29 PM   Modules accepted: Orders

## 2019-11-08 NOTE — Telephone Encounter (Signed)
See mychart.  

## 2019-11-09 ENCOUNTER — Ambulatory Visit (INDEPENDENT_AMBULATORY_CARE_PROVIDER_SITE_OTHER): Payer: BC Managed Care – PPO | Admitting: Physician Assistant

## 2019-11-09 ENCOUNTER — Encounter: Payer: Self-pay | Admitting: Physician Assistant

## 2019-11-09 ENCOUNTER — Other Ambulatory Visit: Payer: Self-pay

## 2019-11-09 VITALS — BP 96/66 | HR 103 | Temp 98.2°F | Resp 16 | Wt 159.0 lb

## 2019-11-09 DIAGNOSIS — K219 Gastro-esophageal reflux disease without esophagitis: Secondary | ICD-10-CM

## 2019-11-09 DIAGNOSIS — K644 Residual hemorrhoidal skin tags: Secondary | ICD-10-CM | POA: Diagnosis not present

## 2019-11-09 MED ORDER — OMEPRAZOLE 40 MG PO CPDR: 40.0000 mg | DELAYED_RELEASE_CAPSULE | Freq: Every day | ORAL | 3 refills | Status: DC

## 2019-11-09 NOTE — Telephone Encounter (Signed)
Requested medication (s) are due for refill today: yes  Requested medication (s) are on the active medication list: yes  Last refill:  09/02/19  Future visit scheduled: yes  Notes to clinic:  not delegated    Requested Prescriptions  Pending Prescriptions Disp Refills   ALPRAZolam (XANAX) 0.5 MG tablet [Pharmacy Med Name: ALPRAZOLAM 0.5 MG TABLET] 45 tablet     Sig: TAKE 1 TABLET BY MOUTH AT BEDTIME AS NEEDED FOR ANXIETY AND 1/2 TAB DURING THE DAY FOR PANIC ATTACKS      Not Delegated - Psychiatry:  Anxiolytics/Hypnotics Failed - 11/08/2019  7:50 PM      Failed - This refill cannot be delegated      Failed - Urine Drug Screen completed in last 360 days.      Failed - Valid encounter within last 6 months    Recent Outpatient Visits           1 year ago Mild episode of recurrent major depressive disorder [facility])   [place], [place], [place]   1 year ago Trochanteric bursitis of right hip   [facility] [place], [place], [place]   1 year ago Sore throat   [facility] Trinna Post, [place]   1 year ago [place], [place]   1 year ago Right hip pain   [place], Vickki Muff, [place]

## 2019-11-09 NOTE — Patient Instructions (Signed)

## 2019-11-09 NOTE — Progress Notes (Signed)
Established patient visit   Patient: Lori [name]   DOB: 01-Aug-1975   44 y.o. Female  MRN: [id number] Visit Date: 11/09/2019  Today's healthcare provider: Mar Daring, PA-C   Chief Complaint  Patient presents with  . Hemorrhoids   Subjective    HPI Patient here with c/o hemorrhoid. She can feel it on the outside, states the is about a dime size. Is really hard and sore. She has been using prep h. She has had hemorrhoids in the past that have been lanced. She has not pick up the anusol suppositories.  Patient Active Problem List   Diagnosis Date Noted  . Endometrial mass 01/18/2017  . DUB (dysfunctional uterine bleeding) 01/18/2017  . Bacterial vaginosis 01/18/2017  . Allergic rhinitis 06/03/2015  . Anxiety 06/03/2015  . Acute systolic heart failure ([place]) 06/03/2015  . Clinical depression 06/03/2015  . Diabetes mellitus, type 2 ([place]) 06/03/2015  . Acid reflux 06/03/2015  . History of biliary T-tube placement 06/03/2015  . HD (Hodgkin's disease) ([place]) 06/03/2015  . RAD (reactive airway disease) 06/03/2015  . Fast heart beat 06/03/2015  . Chronic systolic heart failure ([place]) 05/23/2014  . 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 Acquired hypothyroidism 04/05/2013   Past Medical History:  Diagnosis Date  . Bacterial vaginitis   . CHF (congestive heart failure) ([place])   . Complication of anesthesia    nausea/vomiting  . Depression   . Diabetes mellitus without complication ([place])   . History of abnormal mammogram 2013   @[facility] - NEG MRI AND BX OCT 2014 NORMAL  . History of mammogram 03/2015   BREAST MRI AT [facility] WNL  . History of Papanicolaou smear of cervix 10/08/2013; 08/04/15   -/-; ASCUS/HPV NEG;  . Hodgkin's disease ([place]) 1987   She had surgical resection of Lymph nodes and chemo + rad tx's.  Marland Kitchen Hypertension   . Hypothyroidism    2ND TO CA TX  . PONV (postoperative nausea and vomiting)   . Vitamin D deficiency         Medications: Outpatient Medications Prior to Visit  Medication Sig  . ALPRAZolam (XANAX) 0.5 MG tablet TAKE 1 TABLET BY MOUTH AT BEDTIME AS NEEDED FOR ANXIETY AND 1/2 TAB DURING THE DAY FOR PANIC ATTACKS  . atorvastatin (LIPITOR) 20 MG tablet Take 20 mg by mouth at bedtime.  . BD PEN NEEDLE NANO U/F 32G X 4 MM MISC Use with Insulin injctions  . cephALEXin (KEFLEX) 500 MG capsule Take 1 capsule (500 mg total) by mouth 2 (two) times daily.  . CONTOUR NEXT TEST test strip USE 2 TIMES DAILY TO TEST BLOOD SUGAR  . cyclobenzaprine (FLEXERIL) 5 MG tablet TAKE 1 TABLET BY MOUTH THREE TIMES A DAY AS NEEDED FOR MUSCLE SPASMS  . empagliflozin (JARDIANCE) 25 MG TABS tablet Take by mouth.  . esomeprazole (NEXIUM) 40 MG capsule TAKE 1 CAPSULE BY MOUTH EVERY DAY  . Eszopiclone 3 MG TABS Take 1 tablet (3 mg total) by mouth at bedtime. Take immediately before bedtime  . etodolac (LODINE) 500 MG tablet TAKE 1 TABLET BY MOUTH TWICE A DAY  . hydrocortisone (ANUSOL-HC) 25 MG suppository Place 1 suppository (25 mg total) rectally 2 (two) times daily.  . Lancets (ONETOUCH DELICA PLUS [id number]) MISC USE LANCETS TO TEST BLOOD SUGARS THREE TIMES DAILY  . levonorgestrel (MIRENA) 20 MCG/24HR IUD 1 each by Intrauterine route once.  Marland Kitchen levothyroxine (SYNTHROID) 100 MCG tablet PLEASE SEE ATTACHED FOR DETAILED DIRECTIONS  .  levothyroxine (SYNTHROID) 75 MCG tablet Take one tablet weekly on Sunday.Take on an empty stomach with a glass of water at least 30-60 minutes before breakfast.  . lisinopril (PRINIVIL,ZESTRIL) 5 MG tablet Take 5 mg by mouth at bedtime.   . metFORMIN (GLUCOPHAGE-XR) 500 MG 24 hr tablet Take 1,000 mg by mouth 2 (two) times daily.  . metoprolol succinate (TOPROL-XL) 25 MG 24 hr tablet Take 37.5 mg by mouth daily.   Marland Kitchen NOVOLOG MIX 70/30 FLEXPEN (70-30) 100 UNIT/ML FlexPen Inject 50 Units into the skin 2 (two) times daily.   . sertraline (ZOLOFT) 100 MG tablet Take 2 tablets (200 mg total) by mouth daily.   . traMADol (ULTRAM) 50 MG tablet Take 1 tablet (50 mg total) by mouth every 6 (six) hours as needed.   No facility-administered medications prior to visit.    Review of Systems  Last CBC Lab Results  Component Value Date   WBC 15.8 (H) 03/02/2017   HGB 12.5 03/02/2017   HCT 38.5 03/02/2017   MCV 86.3 03/02/2017   MCH 28.1 03/02/2017   RDW 16.6 (H) 03/02/2017   PLT 460 (H) 54/02/8118   Last metabolic panel Lab Results  Component Value Date   GLUCOSE 107 (H) 03/02/2017   NA 137 03/02/2017   K 4.1 03/02/2017   CL 102 03/02/2017   CO2 27 03/02/2017   BUN 13 03/02/2017   CREATININE 0.55 03/02/2017   GFRNONAA >60 03/02/2017   GFRAA >60 03/02/2017   CALCIUM 9.2 03/02/2017   PHOS 2.9 04/11/2014   PROT 8.2 (H) 03/02/2017   ALBUMIN 4.4 03/02/2017   BILITOT 0.5 03/02/2017   ALKPHOS 62 03/02/2017   AST 23 03/02/2017   ALT 24 03/02/2017   ANIONGAP 8 03/02/2017      Objective    BP 96/66 (BP Location: Left Arm, Patient Position: Sitting, Cuff Size: Normal)   Pulse (!) 103   Temp 98.2 F (36.8 C) (Temporal)   Resp 16   Wt 159 lb (72.1 kg)   BMI 25.66 kg/m  BP Readings from Last 3 Encounters:  11/09/19 96/66  10/25/19 120/80  04/16/19 120/70   Wt Readings from Last 3 Encounters:  11/09/19 159 lb (72.1 kg)  10/25/19 160 lb (72.6 kg)  04/16/19 154 lb (69.9 kg)      Physical Exam Vitals reviewed.  Constitutional:      General: She is not in acute distress.    Appearance: Normal appearance. She is normal weight. She is not ill-appearing.  Genitourinary:    Rectum: Tenderness and external hemorrhoid present.    Neurological:     Mental Status: She is alert.       Results for orders placed or performed in visit on 11/09/19  Hemoglobin A1c  Result Value Ref Range   Hemoglobin A1C 7.3     Assessment & Plan     1. External hemorrhoid External thrombectomy completed today, see procedure note below, and tolerated well. Continue epsom salt soaks/sitz  baths. Call if recurs and will refer to Gen Surg.   2. Gastroesophageal reflux disease without esophagitis Stable. Diagnosis pulled for medication refill. Continue current medical treatment plan. - omeprazole (PRILOSEC) 40 MG capsule; Take 1 capsule (40 mg total) by mouth daily.  Dispense: 90 capsule; Refill: 3   Procedure Note: The Procedure, benefits, risks (including those of infection, bleeding, injury, and allergic reaction), and alternatives were explained to the patient who voiced understanding of the information. All questions were sought and answered before beginning. Verbal  consent was obtained.   The area was prepped and draped in a semi-sterile fashion. The area was cleaned with betadine swabs. 1.5cc of 2% xylocaine with epinephrine was administered. A small 1cm elliptical incision was made using a 10 size blade scalpel. The thrombosed clot was then excised. Area was packed with dry dressing. Minimal bleeding occurred. Patient tolerated well.    No follow-ups on file.      Reynolds Bowl, PA-C, have reviewed all documentation for this visit. The documentation on 11/13/19 for the exam, diagnosis, procedures, and orders are all accurate and complete.   Rubye Beach  [facility] [phone] (phone) [phone] (fax)  [place]

## 2019-11-14 ENCOUNTER — Other Ambulatory Visit: Payer: Self-pay | Admitting: Physician Assistant

## 2019-11-14 DIAGNOSIS — F330 Major depressive disorder, recurrent, mild: Secondary | ICD-10-CM

## 2019-11-14 NOTE — Telephone Encounter (Signed)
Requested medications are due for refill today? Yes  Requested medications are on active medication list? Yes  Last Refill:   04/27/2019  # 180 with 1 refill  Future visit scheduled?  No   Notes to Clinic:  Medication failed RX refill protocol due to no valid encounter in the past 6 months.  Patient's last office visit for Major Depressive Disorder was 08/30/2018.  Recently had a problem focused visit for hemorrhoids.

## 2019-11-27 ENCOUNTER — Encounter: Payer: Self-pay | Admitting: Physician Assistant

## 2019-11-27 ENCOUNTER — Other Ambulatory Visit: Payer: Self-pay

## 2019-11-27 ENCOUNTER — Ambulatory Visit (INDEPENDENT_AMBULATORY_CARE_PROVIDER_SITE_OTHER): Payer: BC Managed Care – PPO | Admitting: Dermatology

## 2019-11-27 DIAGNOSIS — L820 Inflamed seborrheic keratosis: Secondary | ICD-10-CM

## 2019-11-27 MED ORDER — MOMETASONE FUROATE 0.1 % EX CREA: TOPICAL_CREAM | CUTANEOUS | 1 refills | Status: DC

## 2019-11-27 NOTE — Patient Instructions (Signed)

## 2019-11-27 NOTE — Progress Notes (Signed)
   Follow-Up Visit   Subjective  Lori [name] is a 44 y.o. female who presents for the following: Bumps (legs, feet).  Patient was at the beach this past weekend, but didn't go in the ocean. She did swim in the pool. She noticed bumps on Sunday. Not itchy, but she picks at because she can feel them. Using CeraVe Cream daily. No change in detergent, soaps, or moisturizers.   The following portions of the chart were reviewed this encounter and updated as appropriate:      Review of Systems:  No other skin or systemic complaints except as noted in HPI or Assessment and Plan.  Objective  Well appearing patient in no apparent distress; mood and affect are within normal limits.  A focused examination was performed including legs, feet. Relevant physical exam findings are noted in the Assessment and Plan.  Objective  Legs, Feet: Erythematous keratotic or waxy stuck-on papule or plaque.    Assessment & Plan  Inflamed seborrheic keratosis Legs, Feet  Start mometasone cream Apply to affected areas legs BID x 2 weeks or until improved dsp  Avoid face, groin, axilla.  Start CeraVe SA Cream daily - sample given.  Avoid shaving for a few days until improved. Discussed LN2 to bothersome areas if not improving.  Topical steroids (such as triamcinolone, fluocinolone, fluocinonide, mometasone, clobetasol, halobetasol, betamethasone, hydrocortisone) can cause thinning and lightening of the skin if they are used for too long in the same area. Your physician has selected the right strength medicine for your problem and area affected on the body. Please use your medication only as directed by your physician to prevent side effects.    mometasone (ELOCON) 0.1 % cream - Legs, Feet  Return if symptoms worsen or fail to improve.  IJamesetta Orleans, CMA, am acting as scribe for Brendolyn Patty, MD .  Documentation: I have reviewed the above documentation for accuracy and completeness, and I agree with  the above.  Brendolyn Patty MD

## 2019-12-12 DIAGNOSIS — J019 Acute sinusitis, unspecified: Secondary | ICD-10-CM | POA: Diagnosis not present

## 2019-12-13 ENCOUNTER — Telehealth: Payer: BC Managed Care – PPO | Admitting: Family Medicine

## 2020-01-02 DIAGNOSIS — T451X5A Adverse effect of antineoplastic and immunosuppressive drugs, initial encounter: Secondary | ICD-10-CM | POA: Diagnosis not present

## 2020-01-02 DIAGNOSIS — R5383 Other fatigue: Secondary | ICD-10-CM | POA: Diagnosis not present

## 2020-01-02 DIAGNOSIS — Z8571 Personal history of Hodgkin lymphoma: Secondary | ICD-10-CM | POA: Diagnosis not present

## 2020-01-02 DIAGNOSIS — Z9081 Acquired absence of spleen: Secondary | ICD-10-CM | POA: Diagnosis not present

## 2020-01-02 DIAGNOSIS — Z794 Long term (current) use of insulin: Secondary | ICD-10-CM | POA: Diagnosis not present

## 2020-01-02 DIAGNOSIS — I427 Cardiomyopathy due to drug and external agent: Secondary | ICD-10-CM | POA: Diagnosis not present

## 2020-01-02 DIAGNOSIS — E039 Hypothyroidism, unspecified: Secondary | ICD-10-CM | POA: Diagnosis not present

## 2020-01-02 DIAGNOSIS — E119 Type 2 diabetes mellitus without complications: Secondary | ICD-10-CM | POA: Diagnosis not present

## 2020-01-02 DIAGNOSIS — Z9221 Personal history of antineoplastic chemotherapy: Secondary | ICD-10-CM | POA: Diagnosis not present

## 2020-02-07 ENCOUNTER — Encounter: Payer: Self-pay | Admitting: Physician Assistant

## 2020-02-07 MED ORDER — PANTOPRAZOLE SODIUM 40 MG PO TBEC: 40.0000 mg | DELAYED_RELEASE_TABLET | Freq: Every day | ORAL | 3 refills | Status: DC

## 2020-02-13 DIAGNOSIS — Z8571 Personal history of Hodgkin lymphoma: Secondary | ICD-10-CM | POA: Diagnosis not present

## 2020-02-13 DIAGNOSIS — E119 Type 2 diabetes mellitus without complications: Secondary | ICD-10-CM | POA: Diagnosis not present

## 2020-02-13 DIAGNOSIS — I427 Cardiomyopathy due to drug and external agent: Secondary | ICD-10-CM | POA: Diagnosis not present

## 2020-02-13 DIAGNOSIS — R5383 Other fatigue: Secondary | ICD-10-CM | POA: Diagnosis not present

## 2020-02-13 DIAGNOSIS — D649 Anemia, unspecified: Secondary | ICD-10-CM | POA: Diagnosis not present

## 2020-02-13 DIAGNOSIS — Z794 Long term (current) use of insulin: Secondary | ICD-10-CM | POA: Diagnosis not present

## 2020-02-13 DIAGNOSIS — Z1239 Encounter for other screening for malignant neoplasm of breast: Secondary | ICD-10-CM | POA: Diagnosis not present

## 2020-02-13 DIAGNOSIS — T451X5A Adverse effect of antineoplastic and immunosuppressive drugs, initial encounter: Secondary | ICD-10-CM | POA: Diagnosis not present

## 2020-02-13 DIAGNOSIS — Z9221 Personal history of antineoplastic chemotherapy: Secondary | ICD-10-CM | POA: Diagnosis not present

## 2020-02-13 DIAGNOSIS — Z9081 Acquired absence of spleen: Secondary | ICD-10-CM | POA: Diagnosis not present

## 2020-02-13 DIAGNOSIS — E039 Hypothyroidism, unspecified: Secondary | ICD-10-CM | POA: Diagnosis not present

## 2020-02-13 DIAGNOSIS — C8110 Nodular sclerosis classical Hodgkin lymphoma, unspecified site: Secondary | ICD-10-CM | POA: Diagnosis not present

## 2020-02-13 DIAGNOSIS — N6311 Unspecified lump in the right breast, upper outer quadrant: Secondary | ICD-10-CM | POA: Diagnosis not present

## 2020-02-16 ENCOUNTER — Other Ambulatory Visit: Payer: Self-pay | Admitting: Physician Assistant

## 2020-02-16 DIAGNOSIS — M62830 Muscle spasm of back: Secondary | ICD-10-CM

## 2020-02-16 NOTE — Telephone Encounter (Signed)
Requested medication (s) are due for refill today: yes  Requested medication (s) are on the active medication list: yes  Last refill:  10/01/19  Future visit scheduled: no  Notes to clinic:  med not delegated for NT  to RF   Requested Prescriptions  Pending Prescriptions Disp Refills   cyclobenzaprine (FLEXERIL) 5 MG tablet [Pharmacy Med Name: CYCLOBENZAPRINE 5 MG TABLET] 90 tablet 1    Sig: TAKE 1 TABLET BY MOUTH THREE TIMES A DAY AS NEEDED FOR MUSCLE SPASMS      Not Delegated - Analgesics:  Muscle Relaxants Failed - 02/16/2020  8:19 AM      Failed - This refill cannot be delegated      Failed - Valid encounter within last 6 months    Recent Outpatient Visits           3 months ago External hemorrhoid   [facility] [place] Malling M, PA-C   1 year ago Mild episode of recurrent major depressive disorder [facility])   [place], [place], [place]   1 year ago Trochanteric bursitis of right hip   [place], [place], [place]   1 year ago Sore throat   [facility] [place], [place]   1 year ago [place], [place]

## 2020-03-18 ENCOUNTER — Other Ambulatory Visit: Payer: Self-pay | Admitting: Obstetrics and Gynecology

## 2020-03-18 ENCOUNTER — Encounter: Payer: Self-pay | Admitting: Obstetrics and Gynecology

## 2020-03-18 MED ORDER — FLUCONAZOLE 150 MG PO TABS: 150.0000 mg | ORAL_TABLET | Freq: Once | ORAL | 1 refills | Status: DC

## 2020-03-18 NOTE — Progress Notes (Signed)
Rx RF diflucan for yeast vag. Hx of DM

## 2020-04-01 ENCOUNTER — Encounter: Payer: Self-pay | Admitting: Physician Assistant

## 2020-04-01 NOTE — Progress Notes (Addendum)
MyChart Video Visit    Virtual Visit via Video Note   This visit type was conducted due to national recommendations for restrictions regarding the COVID-19 Pandemic (e.g. social distancing) in an effort to limit this patient's exposure and mitigate transmission in our community. This patient is at least at moderate risk for complications without adequate follow up. This format is felt to be most appropriate for this patient at this time. Physical exam was limited by quality of the video and audio technology used for the visit.   Patient location: at home  Provider location:Provider: Provider's office at  [facility], [place].     I discussed the limitations of evaluation and management by telemedicine and the availability of in person appointments. The patient expressed understanding and agreed to proceed.  Patient: Lori [name]   DOB: 01-May-1976   44 y.o. Female  MRN: [id number] Visit Date: 04/02/2020  Today's healthcare provider: Marcille Buffy, FNP   Chief Complaint  Patient presents with  . Sinus Problem   Subjective    Sinus Problem This is a new problem. The current episode started in the past 7 days. There has been no fever. Associated symptoms include chills, congestion, diaphoresis, headaches, sinus pressure, sneezing, a sore throat and swollen glands. Pertinent negatives include no coughing, ear pain, hoarse voice, neck pain or shortness of breath. Past treatments include acetaminophen (Alka-Seltzer Cold & Sinus). The treatment provided mild relief.   Post nasal drip.   Onset 4 days ago started Saturday. She has sore throat, headache.  She has been taking Tylenol sinus with some relief.   No covid exposure known.  Had chills and sweating at initial onset on day 1- 2 and now resolved.  She reports she is afebrile today. Denies any worsening of symptoms.    Patient  denies any  rash, chest pain, shortness of breath, nausea, vomiting, or  diarrhea.   Patient Active Problem List   Diagnosis Date Noted  . Endometrial mass 01/18/2017  . DUB (dysfunctional uterine bleeding) 01/18/2017  . Bacterial vaginosis 01/18/2017  . Allergic rhinitis 06/03/2015  . Anxiety 06/03/2015  . Acute systolic heart failure ([place]) 06/03/2015  . Clinical depression 06/03/2015  . Diabetes mellitus, type 2 ([place]) 06/03/2015  . Acid reflux 06/03/2015  . History of biliary T-tube placement 06/03/2015  . HD (Hodgkin's disease) ([place]) 06/03/2015  . RAD (reactive airway disease) 06/03/2015  . Fast heart beat 06/03/2015  . Chronic systolic heart failure ([place]) 05/23/2014  . 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 Acquired hypothyroidism 04/05/2013   Past Medical History:  Diagnosis Date  . Bacterial vaginitis   . CHF (congestive heart failure) ([place])   . Complication of anesthesia    nausea/vomiting  . Depression   . Diabetes mellitus without complication ([place])   . History of abnormal mammogram 2013   @[facility] - NEG MRI AND BX OCT 2014 NORMAL  . History of mammogram 03/2015   BREAST MRI AT [facility] WNL  . History of Papanicolaou smear of cervix 10/08/2013; 08/04/15   -/-; ASCUS/HPV NEG;  . Hodgkin's disease ([place]) 1987   She had surgical resection of Lymph nodes and chemo + rad tx's.  Marland Kitchen Hypertension   . Hypothyroidism    2ND TO CA TX  . PONV (postoperative nausea and vomiting)   . Vitamin D deficiency    Allergies  Allergen Reactions  . Dapagliflozin Other (See Comments)    [place]ade) UTI  . Liraglutide Nausea Only  Victoza      Medications: Outpatient Medications Prior to Visit  Medication Sig  . ALPRAZolam (XANAX) 0.5 MG tablet TAKE 1 TABLET BY MOUTH AT BEDTIME AS NEEDED FOR ANXIETY AND 1/2 TAB DURING THE DAY FOR PANIC ATTACKS  . atorvastatin (LIPITOR) 20 MG tablet Take 20 mg by mouth at bedtime.  . BD PEN NEEDLE NANO U/F 32G X 4 MM MISC Use with Insulin injctions  . Continuous Blood Gluc Sensor (DEXCOM G6 SENSOR) MISC  SMARTSIG:1 Each Topical Every 10 Days  . Continuous Blood Gluc Transmit (DEXCOM G6 TRANSMITTER) MISC USE TO MONITOR BLOOD SUGARS. CHANGE EVERY 90 DAYS.  Marland Kitchen CONTOUR NEXT TEST test strip USE 2 TIMES DAILY TO TEST BLOOD SUGAR  . cyclobenzaprine (FLEXERIL) 5 MG tablet TAKE 1 TABLET BY MOUTH THREE TIMES A DAY AS NEEDED FOR MUSCLE SPASMS  . empagliflozin (JARDIANCE) 25 MG TABS tablet Take by mouth.  . Eszopiclone 3 MG TABS Take 1 tablet (3 mg total) by mouth at bedtime. Take immediately before bedtime  . Lancets (ONETOUCH DELICA PLUS [id number]) MISC USE LANCETS TO TEST BLOOD SUGARS THREE TIMES DAILY  . levonorgestrel (MIRENA) 20 MCG/24HR IUD 1 each by Intrauterine route once.  Marland Kitchen levothyroxine (SYNTHROID) 88 MCG tablet Take 88 mcg by mouth daily.  Marland Kitchen lisinopril (PRINIVIL,ZESTRIL) 5 MG tablet Take 5 mg by mouth at bedtime.   . metFORMIN (GLUCOPHAGE-XR) 500 MG 24 hr tablet Take 1,000 mg by mouth 2 (two) times daily.  . metoprolol succinate (TOPROL-XL) 50 MG 24 hr tablet Take 50 mg by mouth daily.  Marland Kitchen NOVOLOG MIX 70/30 FLEXPEN (70-30) 100 UNIT/ML FlexPen Inject 50 Units into the skin 2 (two) times daily.   . pantoprazole (PROTONIX) 40 MG tablet Take 1 tablet (40 mg total) by mouth daily.  . sertraline (ZOLOFT) 100 MG tablet TAKE 2 TABLETS BY MOUTH EVERY DAY  . [DISCONTINUED] etodolac (LODINE) 500 MG tablet TAKE 1 TABLET BY MOUTH TWICE A DAY (Patient not taking: Reported on 11/09/2019)  . [DISCONTINUED] hydrocortisone (ANUSOL-HC) 25 MG suppository Place 1 suppository (25 mg total) rectally 2 (two) times daily. (Patient not taking: Reported on 11/09/2019)  . [DISCONTINUED] levothyroxine (SYNTHROID) 100 MCG tablet PLEASE SEE ATTACHED FOR DETAILED DIRECTIONS  . [DISCONTINUED] metoprolol succinate (TOPROL-XL) 25 MG 24 hr tablet Take 37.5 mg by mouth daily.   . [DISCONTINUED] mometasone (ELOCON) 0.1 % cream Apply to affected areas on legs/feet twice a day until improved. Avoid face, groin, underarms.  . [DISCONTINUED]  traMADol (ULTRAM) 50 MG tablet Take 1 tablet (50 mg total) by mouth every 6 (six) hours as needed. (Patient not taking: Reported on 11/09/2019)   No facility-administered medications prior to visit.    Review of Systems  Constitutional: Positive for chills and diaphoresis.  HENT: Positive for congestion, sinus pressure, sneezing and sore throat. Negative for ear pain and hoarse voice.   Respiratory: Negative for cough and shortness of breath.   Musculoskeletal: Negative for neck pain.  Neurological: Positive for headaches.      Objective    Wt 157 lb (71.2 kg)   BMI 25.34 kg/m  BP Readings from Last 3 Encounters:  11/09/19 96/66  10/25/19 120/80  04/16/19 120/70      Physical Exam    Patient is alert and oriented and responsive to questions Engages in conversation with provider. Speaks in full sentences without any pauses without any shortness of breath or distress.    Assessment & Plan    Upper respiratory tract infection, unspecified type  Recommend  testing for influenza and covid, patient declines. Discussed symptom management.   Discussed viral etiology likely given duration of 4 days. Return call if not improving or if worsens at anytime or lasting longer than 10 days.  No orders of the defined types were placed in this encounter.  Red Flags discussed. The patient was given clear instructions to go to ER or return to medical center if any red flags develop, symptoms do not improve, worsen or new problems develop. They verbalized understanding.   Return in about 1 week (around 04/09/2020), or if symptoms worsen or fail to improve, for at any time for any worsening symptoms, Go to Emergency room/ urgent care if worse.     I discussed the assessment and treatment plan with the patient. The patient was provided an opportunity to ask questions and all were answered. The patient agreed with the plan and demonstrated an understanding of the instructions.   The patient was  advised to call back or seek an in-person evaluation if the symptoms worsen or if the condition fails to improve as anticipated.  I provided 22 minutes of non-face-to-face time during this encounter.  I discussed the limitations of evaluation and management by telemedicine and the availability of in person appointments. The patient expressed understanding and agreed to proceed.  Marcille Buffy, [place] [phone] (phone) [phone] (fax)  [place]

## 2020-04-02 ENCOUNTER — Encounter: Payer: Self-pay | Admitting: Adult Health

## 2020-04-02 ENCOUNTER — Telehealth (INDEPENDENT_AMBULATORY_CARE_PROVIDER_SITE_OTHER): Payer: BC Managed Care – PPO | Admitting: Adult Health

## 2020-04-02 VITALS — Wt 157.0 lb

## 2020-04-02 DIAGNOSIS — J069 Acute upper respiratory infection, unspecified: Secondary | ICD-10-CM

## 2020-04-02 NOTE — Patient Instructions (Signed)
Sinusitis, Adult Sinusitis is soreness and swelling (inflammation) of your sinuses. Sinuses are hollow spaces in the bones around your face. They are located:  Around your eyes.  In the middle of your forehead.  Behind your nose.  In your cheekbones. Your sinuses and nasal passages are lined with a fluid called mucus. Mucus drains out of your sinuses. Swelling can trap mucus in your sinuses. This lets germs (bacteria, virus, or fungus) grow, which leads to infection. Most of the time, this condition is caused by a virus. What are the causes? This condition is caused by:  Allergies.  Asthma.  Germs.  Things that block your nose or sinuses.  Growths in the nose (nasal polyps).  Chemicals or irritants in the air.  Fungus (rare). What increases the risk? You are more likely to develop this condition if:  You have a weak body defense system (immune system).  You do a lot of swimming or diving.  You use nasal sprays too much.  You smoke. What are the signs or symptoms? The main symptoms of this condition are pain and a feeling of pressure around the sinuses. Other symptoms include:  Stuffy nose (congestion).  Runny nose (drainage).  Swelling and warmth in the sinuses.  Headache.  Toothache.  A cough that may get worse at night.  Mucus that collects in the throat or the back of the nose (postnasal drip).  Being unable to smell and taste.  Being very tired (fatigue).  A fever.  Sore throat.  Bad breath. How is this diagnosed? This condition is diagnosed based on:  Your symptoms.  Your medical history.  A physical exam.  Tests to find out if your condition is short-term (acute) or long-term (chronic). Your doctor may: ? Check your nose for growths (polyps). ? Check your sinuses using a tool that has a light (endoscope). ? Check for allergies or germs. ? Do imaging tests, such as an MRI or CT scan. How is this treated? Treatment for this condition  depends on the cause and whether it is short-term or long-term.  If caused by a virus, your symptoms should go away on their own within 10 days. You may be given medicines to relieve symptoms. They include: ? Medicines that shrink swollen tissue in the nose. ? Medicines that treat allergies (antihistamines). ? A spray that treats swelling of the nostrils. ? Rinses that help get rid of thick mucus in your nose (nasal saline washes).  If caused by bacteria, your doctor may wait to see if you will get better without treatment. You may be given antibiotic medicine if you have: ? A very bad infection. ? A weak body defense system.  If caused by growths in the nose, you may need to have surgery. Follow these instructions at home: Medicines  Take, use, or apply over-the-counter and prescription medicines only as told by your doctor. These may include nasal sprays.  If you were prescribed an antibiotic medicine, take it as told by your doctor. Do not stop taking the antibiotic even if you start to feel better. Hydrate and humidify   Drink enough water to keep your pee (urine) pale yellow.  Use a cool mist humidifier to keep the humidity level in your home above 50%.  Breathe in steam for 10-15 minutes, 3-4 times a day, or as told by your doctor. You can do this in the bathroom while a hot shower is running.  Try not to spend time in cool or dry air.  Rest  Rest as much as you can.  Sleep with your head raised (elevated).  Make sure you get enough sleep each night. General instructions   Put a warm, moist washcloth on your face 3-4 times a day, or as often as told by your doctor. This will help with discomfort.  Wash your hands often with soap and water. If there is no soap and water, use hand sanitizer.  Do not smoke. Avoid being around people who are smoking (secondhand smoke).  Keep all follow-up visits as told by your doctor. This is important. Contact a doctor if:  You  have a fever.  Your symptoms get worse.  Your symptoms do not get better within 10 days. Get help right away if:  You have a very bad headache.  You cannot stop throwing up (vomiting).  You have very bad pain or swelling around your face or eyes.  You have trouble seeing.  You feel confused.  Your neck is stiff.  You have trouble breathing. Summary  Sinusitis is swelling of your sinuses. Sinuses are hollow spaces in the bones around your face.  This condition is caused by tissues in your nose that become inflamed or swollen. This traps germs. These can lead to infection.  If you were prescribed an antibiotic medicine, take it as told by your doctor. Do not stop taking it even if you start to feel better.  Keep all follow-up visits as told by your doctor. This is important. This information is not intended to replace advice given to you by your health care provider. Make sure you discuss any questions you have with your health care provider. Document Revised: 10/24/2017 Document Reviewed: 10/24/2017 Elsevier Patient Education  2020 [place] oral ER tablets What is this medicine? GUAIFENESIN (gwye FEN e sin) is an expectorant. It helps to thin mucous and make coughs more productive. This medicine is used to treat coughs caused by colds or the flu. It is not intended to treat chronic cough caused by smoking, asthma, emphysema, or heart failure. This medicine may be used for other purposes; ask your health care provider or pharmacist if you have questions. COMMON BRAND NAME(S): Humibid, Mucinex What should I tell my health care provider before I take this medicine? They need to know if you have any of these conditions:  fever  kidney disease  an unusual or allergic reaction to guaifenesin, other medicines, foods, dyes, or preservatives  pregnant or trying to get pregnant  breast-feeding How should I use this medicine? Take this medicine by mouth with a  full glass of water. Follow the directions on the prescription label. Do not break, chew or crush this medicine. You may take with food or on an empty stomach. Take your medicine at regular intervals. Do not take your medicine more often than directed. Talk to your pediatrician regarding the use of this medicine in children. While this drug may be prescribed for children as young as 44 years old for selected conditions, precautions do apply. Overdosage: If you think you have taken too much of this medicine contact a poison control center or emergency room at once. NOTE: This medicine is only for you. Do not share this medicine with others. What if I miss a dose? If you miss a dose, take it as soon as you can. If it is almost time for your next dose, take only that dose. Do not take double or extra doses. What may interact with this medicine? Interactions are  not expected. This list may not describe all possible interactions. Give your health care provider a list of all the medicines, herbs, non-prescription drugs, or dietary supplements you use. Also tell them if you smoke, drink alcohol, or use illegal drugs. Some items may interact with your medicine. What should I watch for while using this medicine? Do not treat a cough for more than 1 week without consulting your doctor or health care professional. If you also have a high fever, skin rash, continuing headache, or sore throat, see your doctor. For best results, drink 6 to 8 glasses water daily while you are taking this medicine. What side effects may I notice from receiving this medicine? Side effects that you should report to your doctor or health care professional as soon as possible:  allergic reactions like skin rash, itching or hives, swelling of the face, lips, or tongue Side effects that usually do not require medical attention (report to your doctor or health care professional if they continue or are  bothersome):  dizziness  headache  stomach upset This list may not describe all possible side effects. Call your doctor for medical advice about side effects. You may report side effects to FDA at 1-800-FDA-1088. Where should I keep my medicine? Keep out of the reach of children. Store at room temperature between 20 and 25 degrees C (68 and 77 degrees F). Keep container tightly closed. Throw away any unused medicine after the expiration date. NOTE: This sheet is a summary. It may not cover all possible information. If you have questions about this medicine, talk to your doctor, pharmacist, or health care provider.  2020 Elsevier/Gold Standard (2007-10-04 12:14:14)

## 2020-04-07 ENCOUNTER — Other Ambulatory Visit: Payer: Self-pay | Admitting: Adult Health

## 2020-04-07 ENCOUNTER — Encounter: Payer: Self-pay | Admitting: Adult Health

## 2020-04-07 MED ORDER — BENZONATATE 100 MG PO CAPS: 100.0000 mg | ORAL_CAPSULE | Freq: Two times a day (BID) | ORAL | 0 refills | Status: DC | PRN

## 2020-04-07 MED ORDER — PREDNISONE 10 MG (21) PO TBPK: ORAL_TABLET | ORAL | 0 refills | Status: DC

## 2020-04-07 MED ORDER — AMOXICILLIN-POT CLAVULANATE 875-125 MG PO TABS: 1.0000 | ORAL_TABLET | Freq: Two times a day (BID) | ORAL | 0 refills | Status: DC

## 2020-04-09 ENCOUNTER — Encounter: Payer: Self-pay | Admitting: Physician Assistant

## 2020-04-23 DIAGNOSIS — E1165 Type 2 diabetes mellitus with hyperglycemia: Secondary | ICD-10-CM | POA: Diagnosis not present

## 2020-04-23 DIAGNOSIS — E039 Hypothyroidism, unspecified: Secondary | ICD-10-CM | POA: Diagnosis not present

## 2020-04-23 DIAGNOSIS — Z794 Long term (current) use of insulin: Secondary | ICD-10-CM | POA: Diagnosis not present

## 2020-04-28 ENCOUNTER — Other Ambulatory Visit: Payer: Self-pay | Admitting: Adult Health

## 2020-04-28 ENCOUNTER — Other Ambulatory Visit: Payer: Self-pay | Admitting: Obstetrics and Gynecology

## 2020-04-28 DIAGNOSIS — E039 Hypothyroidism, unspecified: Secondary | ICD-10-CM | POA: Diagnosis not present

## 2020-04-28 DIAGNOSIS — E559 Vitamin D deficiency, unspecified: Secondary | ICD-10-CM | POA: Diagnosis not present

## 2020-04-28 DIAGNOSIS — Z794 Long term (current) use of insulin: Secondary | ICD-10-CM | POA: Diagnosis not present

## 2020-04-28 DIAGNOSIS — E1165 Type 2 diabetes mellitus with hyperglycemia: Secondary | ICD-10-CM | POA: Diagnosis not present

## 2020-04-28 DIAGNOSIS — E782 Mixed hyperlipidemia: Secondary | ICD-10-CM | POA: Diagnosis not present

## 2020-04-28 NOTE — Telephone Encounter (Signed)
Notes to clinic:   Medication not assigned to a protocol, review manually  Requested Prescriptions  Pending Prescriptions Disp Refills   benzonatate (TESSALON) 100 MG capsule [Pharmacy Med Name: BENZONATATE 100 MG CAPSULE] 20 capsule 0    Sig: TAKE 1 CAPSULE BY MOUTH 2 TIMES DAILY AS NEEDED FOR COUGH.      Ear, Nose, and Throat:  Antitussives/Expectorants Passed - 04/28/2020 11:00 AM      Passed - Valid encounter within last 12 months    Recent Outpatient Visits           3 weeks ago Upper respiratory tract infection, unspecified type   [place], Kelby Aline, FNP   5 months ago External hemorrhoid   [facility] [place] Malling M, [place]   1 year ago Mild episode of recurrent major depressive disorder [facility])   [facility] [place], Clearnce [place], [place]   1 year ago Trochanteric bursitis of right hip   [place], [place], [place]   1 year ago Sore throat   [place], Adriana M, PA-C                predniSONE (STERAPRED UNI-PAK 21 TAB) 10 MG (21) TBPK tablet [Pharmacy Med Name: PREDNISONE 10 MG TAB DSPK] 21 each     Sig: TAKE 6 TABLETS ON DAY 1 AS DIRECTED ON PACKAGE AND DECREASE BY 1 TAB EACH DAY FOR A TOTAL OF 6 DAYS      Not Delegated - Endocrinology:  Oral Corticosteroids Failed - 04/28/2020 11:00 AM      Failed - This refill cannot be delegated      Passed - Last BP in normal range    BP Readings from Last 1 Encounters:  11/09/19 96/66          Passed - Valid encounter within last 6 months    Recent Outpatient Visits           3 weeks ago Upper respiratory tract infection, unspecified type   [facility] Fl[place], Kelby Aline, FNP   5 months ago External hemorrhoid   [facility], [place], [place]   1 year ago Mild episode of recurrent major depressive disorder [facility])   [facility] [place], [place], [place]    1 year ago Trochanteric bursitis of right hip   [place], [place], [place]   1 year ago Sore throat   [place], PA-C                amoxicillin-clavulanate (AUGMENTIN) 875-125 MG tablet [Pharmacy Med Name: AMOXICILLIN-CLAV 875-125MG  TAB] 20 tablet 0    Sig: TAKE 1 TABLET BY MOUTH TWICE A DAY      Off-Protocol Failed - 04/28/2020 11:00 AM      Failed - Medication not assigned to a protocol, review manually.      Passed - Valid encounter within last 12 months    Recent Outpatient Visits           3 weeks ago Upper respiratory tract infection, unspecified type   [facility] Flinchum, Kelby Aline, FNP   5 months ago External hemorrhoid   [place], [place]   1 year ago Mild episode of recurrent major depressive disorder [facility])   [place], [place]   1 year ago Trochanteric bursitis of right hip   [facility] [place], [place]  M, PA-C   1 year ago Sore throat   [facility] Carles Collet [place], [place]

## 2020-05-14 ENCOUNTER — Encounter: Payer: Self-pay | Admitting: Physician Assistant

## 2020-05-14 DIAGNOSIS — G5603 Carpal tunnel syndrome, bilateral upper limbs: Secondary | ICD-10-CM

## 2020-05-14 DIAGNOSIS — M7061 Trochanteric bursitis, right hip: Secondary | ICD-10-CM

## 2020-05-16 MED ORDER — MELOXICAM 7.5 MG PO TABS: 7.5000 mg | ORAL_TABLET | Freq: Every day | ORAL | 0 refills | Status: DC

## 2020-05-16 NOTE — Addendum Note (Signed)
Addended by: Mar Daring on: 05/16/2020 12:58 PM   Modules accepted: Orders

## 2020-05-28 ENCOUNTER — Other Ambulatory Visit: Payer: Self-pay | Admitting: Physician Assistant

## 2020-05-28 DIAGNOSIS — M62830 Muscle spasm of back: Secondary | ICD-10-CM

## 2020-05-28 NOTE — Telephone Encounter (Signed)
Requested medications are due for refill today yes  Requested medications are on the active medication list yes  Last refill 11/8  Last visit one year ago  Future visit scheduled no  Notes to clinic Failed protocol of valid visit within 6 months and Not Delegated.

## 2020-06-05 DIAGNOSIS — Z20822 Contact with and (suspected) exposure to covid-19: Secondary | ICD-10-CM | POA: Diagnosis not present

## 2020-06-10 ENCOUNTER — Encounter: Payer: Self-pay | Admitting: Physician Assistant

## 2020-06-13 ENCOUNTER — Encounter: Payer: Self-pay | Admitting: Adult Health

## 2020-06-13 DIAGNOSIS — M7061 Trochanteric bursitis, right hip: Secondary | ICD-10-CM | POA: Insufficient documentation

## 2020-06-13 DIAGNOSIS — G5601 Carpal tunnel syndrome, right upper limb: Secondary | ICD-10-CM | POA: Diagnosis not present

## 2020-06-16 DIAGNOSIS — E119 Type 2 diabetes mellitus without complications: Secondary | ICD-10-CM | POA: Diagnosis not present

## 2020-06-20 ENCOUNTER — Other Ambulatory Visit: Payer: Self-pay | Admitting: Physician Assistant

## 2020-06-20 DIAGNOSIS — F330 Major depressive disorder, recurrent, mild: Secondary | ICD-10-CM

## 2020-07-02 ENCOUNTER — Other Ambulatory Visit: Payer: Self-pay | Admitting: Physician Assistant

## 2020-07-02 DIAGNOSIS — F330 Major depressive disorder, recurrent, mild: Secondary | ICD-10-CM

## 2020-07-03 DIAGNOSIS — G5601 Carpal tunnel syndrome, right upper limb: Secondary | ICD-10-CM | POA: Diagnosis not present

## 2020-07-03 NOTE — Telephone Encounter (Signed)
Requested medication (s) are due for refill today: yes  Requested medication (s) are on the active medication list: yes  Last refill:  last filled a week ago  Future visit scheduled: no  Notes to clinic:  Please review if refill for 90 day supply and dosage is appropriate. See MyChart message on 04/09/20.    Requested Prescriptions  Pending Prescriptions Disp Refills   sertraline (ZOLOFT) 100 MG tablet [Pharmacy Med Name: SERTRALINE HCL 100 MG TABLET] 180 tablet 1    Sig: TAKE 2 TABLETS BY MOUTH EVERY DAY      Psychiatry:  Antidepressants - SSRI Failed - 07/02/2020  8:15 PM      Failed - Completed PHQ-2 or PHQ-9 in the last 360 days      Passed - Valid encounter within last 6 months    Recent Outpatient Visits           3 months ago Upper respiratory tract infection, unspecified type   [facility] Flinchum, Kelby Aline, FNP   7 months ago External hemorrhoid   [place], [place]   1 year ago Mild episode of recurrent major depressive disorder [facility])   [place], [place]   1 year ago Trochanteric bursitis of right hip   [place], [place], [place]   1 year ago Sore throat   [facility] Carles Collet [place], [place]

## 2020-07-04 NOTE — Telephone Encounter (Signed)
Please review. KW 

## 2020-07-08 DIAGNOSIS — G5601 Carpal tunnel syndrome, right upper limb: Secondary | ICD-10-CM | POA: Diagnosis not present

## 2020-07-15 ENCOUNTER — Encounter: Payer: Self-pay | Admitting: Physician Assistant

## 2020-07-24 ENCOUNTER — Ambulatory Visit: Payer: BC Managed Care – PPO | Admitting: Physician Assistant

## 2020-07-29 ENCOUNTER — Ambulatory Visit: Payer: BC Managed Care – PPO | Admitting: Physician Assistant

## 2020-08-04 ENCOUNTER — Ambulatory Visit: Payer: BC Managed Care – PPO | Admitting: Physician Assistant

## 2020-08-04 ENCOUNTER — Other Ambulatory Visit: Payer: Self-pay

## 2020-08-04 ENCOUNTER — Ambulatory Visit
Admission: RE | Admit: 2020-08-04 | Discharge: 2020-08-04 | Disposition: A | Discharge status: Home / Self Care | Payer: BC Managed Care – PPO | Source: Ambulatory Visit | Attending: Physician Assistant | Admitting: Physician Assistant

## 2020-08-04 ENCOUNTER — Encounter: Payer: Self-pay | Admitting: Physician Assistant

## 2020-08-04 VITALS — BP 120/82 | HR 105 | Temp 98.1°F | Wt 158.0 lb

## 2020-08-04 DIAGNOSIS — G8929 Other chronic pain: Secondary | ICD-10-CM | POA: Diagnosis not present

## 2020-08-04 DIAGNOSIS — M5441 Lumbago with sciatica, right side: Secondary | ICD-10-CM | POA: Diagnosis not present

## 2020-08-04 DIAGNOSIS — M545 Low back pain, unspecified: Secondary | ICD-10-CM | POA: Diagnosis not present

## 2020-08-04 MED ORDER — METAXALONE 800 MG PO TABS: 800.0000 mg | ORAL_TABLET | Freq: Three times a day (TID) | ORAL | 1 refills | Status: DC

## 2020-08-04 MED ORDER — MELOXICAM 15 MG PO TABS: 15.0000 mg | ORAL_TABLET | Freq: Every day | ORAL | 0 refills | Status: DC

## 2020-08-04 NOTE — Patient Instructions (Signed)

## 2020-08-04 NOTE — Progress Notes (Signed)
Established patient visit   Patient: Lori [name]   DOB: December 26, 1975   45 y.o. Female  MRN: [id number] Visit Date: 08/04/2020  Today's healthcare provider: Mar Daring, PA-C   Chief Complaint  Patient presents with  . Back Pain  . Hip Pain   Subjective    Back Pain This is a new problem. The current episode started more than 1 month ago. The problem occurs daily. The problem has been gradually worsening since onset. The pain is present in the thoracic spine. The quality of the pain is described as aching. The pain does not radiate. The symptoms are aggravated by position and sitting. Associated symptoms include leg pain. Pertinent negatives include no abdominal pain, bladder incontinence, bowel incontinence, chest pain, fever, headaches, numbness, paresthesias, tingling or weakness. She has tried muscle relaxant for the symptoms. The treatment provided no relief.     Patient Active Problem List   Diagnosis Date Noted  . Endometrial mass 01/18/2017  . DUB (dysfunctional uterine bleeding) 01/18/2017  . Bacterial vaginosis 01/18/2017  . Allergic rhinitis 06/03/2015  . Anxiety 06/03/2015  . Acute systolic heart failure ([place]) 06/03/2015  . Clinical depression 06/03/2015  . Diabetes mellitus, type 2 ([place]) 06/03/2015  . Acid reflux 06/03/2015  . History of biliary T-tube placement 06/03/2015  . HD (Hodgkin's disease) ([place]) 06/03/2015  . RAD (reactive airway disease) 06/03/2015  . Fast heart beat 06/03/2015  . Chronic systolic heart failure ([place]) 05/23/2014  . 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 Acquired hypothyroidism 04/05/2013   Past Medical History:  Diagnosis Date  . Bacterial vaginitis   . CHF (congestive heart failure) ([place])   . Complication of anesthesia    nausea/vomiting  . Depression   . Diabetes mellitus without complication ([place])   . History of abnormal mammogram 2013   @[facility] - NEG MRI AND BX OCT 2014 NORMAL  . History of  mammogram 03/2015   BREAST MRI AT [facility] WNL  . History of Papanicolaou smear of cervix 10/08/2013; 08/04/15   -/-; ASCUS/HPV NEG;  . Hodgkin's disease ([place]) 1987   She had surgical resection of Lymph nodes and chemo + rad tx's.  Marland Kitchen Hypertension   . Hypothyroidism    2ND TO CA TX  . PONV (postoperative nausea and vomiting)   . Vitamin D deficiency    Social History   Tobacco Use  . Smoking status: Former Smoker    Packs/day: 0.25    Years: 10.00    Pack years: 2.50    Types: Cigarettes    Quit date: 03/10/2016    Years since quitting: 4.4  . Smokeless tobacco: Never Used  Vaping Use  . Vaping Use: Never used  Substance Use Topics  . Alcohol use: Yes    Alcohol/week: 2.0 standard drinks    Types: 2 Glasses of wine per week    Comment: occasional  . Drug use: No   Allergies  Allergen Reactions  . Dapagliflozin Other (See Comments)    Wilder Glade) UTI  . Liraglutide Nausea Only    Victoza     Medications: Outpatient Medications Prior to Visit  Medication Sig  . ALPRAZolam (XANAX) 0.5 MG tablet TAKE 1 TABLET BY MOUTH AT BEDTIME AS NEEDED FOR ANXIETY AND 1/2 TAB DURING THE DAY FOR PANIC ATTACKS  . atorvastatin (LIPITOR) 20 MG tablet Take 20 mg by mouth at bedtime.  . BD PEN NEEDLE NANO U/F 32G X 4 MM MISC Use with Insulin injctions  .  Continuous Blood Gluc Sensor (DEXCOM G6 SENSOR) MISC SMARTSIG:1 Each Topical Every 10 Days  . Continuous Blood Gluc Transmit (DEXCOM G6 TRANSMITTER) MISC USE TO MONITOR BLOOD SUGARS. CHANGE EVERY 90 DAYS.  Marland Kitchen CONTOUR NEXT TEST test strip USE 2 TIMES DAILY TO TEST BLOOD SUGAR  . empagliflozin (JARDIANCE) 25 MG TABS tablet Take by mouth.  . Eszopiclone 3 MG TABS Take 1 tablet (3 mg total) by mouth at bedtime. Take immediately before bedtime  . Lancets (ONETOUCH DELICA PLUS [id number]) MISC USE LANCETS TO TEST BLOOD SUGARS THREE TIMES DAILY  . levonorgestrel (MIRENA) 20 MCG/24HR IUD 1 each by Intrauterine route once.  Marland Kitchen levothyroxine (SYNTHROID) 88  MCG tablet Take 88 mcg by mouth daily.  Marland Kitchen lisinopril (PRINIVIL,ZESTRIL) 5 MG tablet Take 5 mg by mouth at bedtime.   . metFORMIN (GLUCOPHAGE-XR) 500 MG 24 hr tablet Take 1,000 mg by mouth 2 (two) times daily.  . metoprolol succinate (TOPROL-XL) 50 MG 24 hr tablet Take 50 mg by mouth daily.  Marland Kitchen NOVOLOG MIX 70/30 FLEXPEN (70-30) 100 UNIT/ML FlexPen Inject 50 Units into the skin 2 (two) times daily.   . pantoprazole (PROTONIX) 40 MG tablet Take 1 tablet (40 mg total) by mouth daily.  . sertraline (ZOLOFT) 100 MG tablet TAKE 2 TABLETS BY MOUTH EVERY DAY  . [DISCONTINUED] amoxicillin-clavulanate (AUGMENTIN) 875-125 MG tablet Take 1 tablet by mouth 2 (two) times daily.  . [DISCONTINUED] benzonatate (TESSALON) 100 MG capsule Take 1 capsule (100 mg total) by mouth 2 (two) times daily as needed for cough.  . [DISCONTINUED] cyclobenzaprine (FLEXERIL) 5 MG tablet TAKE 1 TABLET BY MOUTH THREE TIMES A DAY AS NEEDED FOR MUSCLE SPASMS  . [DISCONTINUED] fluconazole (DIFLUCAN) 150 MG tablet TAKE 1 TABLET BY MOUTH AS ONE DOSE  . [DISCONTINUED] meloxicam (MOBIC) 7.5 MG tablet Take 1 tablet (7.5 mg total) by mouth daily.  . [DISCONTINUED] predniSONE (STERAPRED UNI-PAK 21 TAB) 10 MG (21) TBPK tablet PO: Take 6 tablets on day 1:Take 5 tablets day 2:Take 4 tablets day 3: Take 3 tablets day 4:Take 2 tablets day five: 5 Take 1 tablet day 6   No facility-administered medications prior to visit.    Review of Systems  Constitutional: Negative.  Negative for fever.  Respiratory: Negative.   Cardiovascular: Negative.  Negative for chest pain.  Gastrointestinal: Negative.  Negative for abdominal pain and bowel incontinence.  Genitourinary: Negative for bladder incontinence.  Musculoskeletal: Positive for arthralgias (Right hip pain), back pain and myalgias (Right leg pain). Negative for gait problem, joint swelling, neck pain and neck stiffness.  Neurological: Negative for dizziness, tingling, weakness, light-headedness,  numbness, headaches and paresthesias.        Objective    BP 120/82 (BP Location: Left Arm, Patient Position: Sitting, Cuff Size: Large)   Pulse (!) 105   Temp 98.1 F (36.7 C) (Oral)   Wt 158 lb (71.7 kg)   BMI 25.50 kg/m    Physical Exam Vitals reviewed.  Constitutional:      General: She is not in acute distress.    Appearance: Normal appearance. She is well-developed, normal weight and well-nourished. She is not ill-appearing.  HENT:     Head: Normocephalic and atraumatic.  Eyes:     Extraocular Movements: EOM normal.  Pulmonary:     Effort: Pulmonary effort is normal. No respiratory distress.  Musculoskeletal:     Cervical back: Normal range of motion and neck supple. No tenderness.     Thoracic back: Tenderness (mild tenderness noted over the  paraspinal muscles on the right in the thoracic spine and radiates through to insertion at iliac) present. No spasms or bony tenderness. Normal range of motion.     Lumbar back: Tenderness present. No spasms or bony tenderness. Decreased range of motion (decreased forward flexion and lateral flexion felt pulling). Positive right straight leg raise test (felt pulling sensation through back). Negative left straight leg raise test. No scoliosis.       Back:     Right lower leg: No edema.     Left lower leg: No edema.  Skin:    General: Skin is warm and dry.     Capillary Refill: Capillary refill takes less than 2 seconds.  Neurological:     General: No focal deficit present.     Mental Status: She is alert and oriented to person, place, and time. Mental status is at baseline.  Psychiatric:        Mood and Affect: Mood and affect and mood normal.        Behavior: Behavior normal.        Thought Content: Thought content normal.        Judgment: Judgment normal.      No results found for any visits on 08/04/20.  Assessment & Plan     1. Chronic right-sided low back pain with right-sided sciatica Will stop flexeril  (ineffective). Start Skelaxin as below. Increase Meloxicam from 7.5 mg to 15mg  as below. Moist heat, stretching and back exercises (printed on AVS) can help. Discussed working on flexibility and core strengthening with yoga. May benefit from formal PT program. Will get xray as below to r/o bony abnormalities. Call if worsening.  - metaxalone (SKELAXIN) 800 MG tablet; Take 1 tablet (800 mg total) by mouth 3 (three) times daily.  Dispense: 30 tablet; Refill: 1 - meloxicam (MOBIC) 15 MG tablet; Take 1 tablet (15 mg total) by mouth daily.  Dispense: 30 tablet; Refill: 0 - DG Lumbar Spine Complete; Future   No follow-ups on file.      Reynolds Bowl, PA-C, have reviewed all documentation for this visit. The documentation on 08/04/20 for the exam, diagnosis, procedures, and orders are all accurate and complete.   Rubye Beach  [facility] [phone] (phone) [phone] (fax)  [place]

## 2020-08-20 ENCOUNTER — Other Ambulatory Visit: Payer: Self-pay | Admitting: Physician Assistant

## 2020-08-20 DIAGNOSIS — F411 Generalized anxiety disorder: Secondary | ICD-10-CM

## 2020-08-20 NOTE — Telephone Encounter (Signed)
Requested medications are due for refill today.  yes  Requested medications are on the active medications list.  yes  Last refill. 11/09/2019  Future visit scheduled.   No  Notes to clinic.  Medication not delegated

## 2020-08-25 ENCOUNTER — Encounter: Payer: Self-pay | Admitting: Physician Assistant

## 2020-08-25 ENCOUNTER — Other Ambulatory Visit: Payer: Self-pay

## 2020-08-25 ENCOUNTER — Ambulatory Visit: Payer: BC Managed Care – PPO | Admitting: Physician Assistant

## 2020-08-25 VITALS — BP 92/64 | HR 81 | Temp 97.7°F | Wt 155.0 lb

## 2020-08-25 DIAGNOSIS — S76311A Strain of muscle, fascia and tendon of the posterior muscle group at thigh level, right thigh, initial encounter: Secondary | ICD-10-CM | POA: Diagnosis not present

## 2020-08-25 MED ORDER — METHYLPREDNISOLONE 4 MG PO TBPK: ORAL_TABLET | ORAL | 0 refills | Status: DC

## 2020-08-25 NOTE — Patient Instructions (Signed)
Hamstring Strain Rehab Ask your health care provider which exercises are safe for you. Do exercises exactly as told by your health care provider and adjust them as directed. It is normal to feel mild stretching, pulling, tightness, or discomfort as you do these exercises. Stop right away if you feel sudden pain or your pain gets worse. Do not begin these exercises until told by your health care provider. Stretching and range-of-motion exercises These exercises warm up your muscles and joints and improve the movement and flexibility of your thighs. These exercises also help to relieve pain, numbness, and tingling. Talk to your health care provider about these restrictions. Knee extension, seated 1. Sit with your left / right heel propped on a chair, a coffee table, or a footstool. Do not have anything under your knee to support it. 2. Allow your leg muscles to relax, letting gravity straighten out your knee (extension). You should feel a stretch behind your left / right knee. 3. If told by your health care provider, deepen the stretch by placing a __________ weight on your thigh, just above your kneecap. 4. Hold this position for __________ seconds. Repeat __________ times. Complete this exercise __________ times a day.   Seated stretch This exercise is sometimes called hamstrings and adductors stretch. 1. Sit on the floor with your legs stretched wide. Keep your knees straight during this exercise. 2. Keeping your head and back in a straight line, bend at your waist to reach for your left foot (position A). You should feel a stretch in your right inner thigh (adductors). 3. Hold this position for __________ seconds. Then slowly return to the upright position. 4. Keeping your head and back in a straight line, bend at your waist to reach forward (position B). You should feel a stretch behind both of your thighs or knees (hamstrings). 5. Hold this position for __________ seconds. Then slowly return to  the upright position. 6. Keeping your head and back in a straight line, bend at your waist to reach for your right foot (position C). You should feel a stretch in your left inner thigh (adductors). 7. Hold this position for __________ seconds. Then slowly return to the upright position. Repeat __________ times. Complete this exercise __________ times a day.   Hamstrings stretch, supine 1. Lie on your back (supine position). 2. Loop a belt or towel over the ball of your left / right foot. The ball of your foot is on the walking surface, right under your toes. 3. Straighten your left / right knee and slowly pull on the belt or towel to raise your leg. ? Do not let your left / right knee bend while you do this. ? Keep your other leg flat on the floor. ? Raise the left / right leg until you feel a gentle stretch behind your left / right knee or thigh (hamstrings). 4. Hold this position for __________ seconds. 5. Slowly return your leg to the starting position. Repeat __________ times. Complete this exercise __________ times a day.   Strengthening exercises These exercises build strength and endurance in your thighs. Endurance is the ability to use your muscles for a long time, even after they get tired. Straight leg raises, prone This exercise strengthens the muscles that move the hips (hip extensors). 1. Lie on your abdomen on a firm surface (prone position). 2. Tense the muscles in your buttocks and lift your left / right leg about 4 inches (10 cm). Keep your knee straight as you lift  your leg. If you cannot lift your leg that high without arching your back, place a pillow under your hips. 3. Hold the position for __________ seconds. 4. Slowly lower your leg to the starting position. 5. Allow your muscles to relax completely before you start the next repetition. Repeat __________ times. Complete this exercise __________ times a day.   Bridge This exercise strengthens the muscles in your  buttocks and the back of your thighs (hip extensors). 1. Lie on your back on a firm surface with your knees bent and your feet flat on the floor. 2. Tighten your buttocks muscles and lift your bottom off the floor until the trunk of your body is level with your thighs. ? You should feel the muscles working in your buttocks and the back of your thighs. ? Do not arch your back. 3. Hold this position for __________ seconds. 4. Slowly lower your hips to the starting position. 5. Let your buttocks muscles relax completely between repetitions. 6. If told by your health care provider, keep your bottom lifted off the floor while you slowly walk your feet away from you as far as you can control. Hold for __________ seconds, then slowly walk your feet back toward you. Repeat __________ times. Complete this exercise __________ times a day.   Lateral walking with band This is an exercise in which you walk sideways (lateral), with tension provided by an exercise band. The exercise strengthens the muscles in your hip (hip abductors). 1. Stand in a long hallway. 2. Wrap a loop of exercise band around your legs, just above your knees. 3. Bend your knees gently and drop your hips down and back so your weight is over your heels. 4. Step to the side to move down the length of the hallway, keeping your toes pointed ahead of you and keeping tension in the band. 5. Repeat, leading with your other leg. Repeat __________ times. Complete this exercise __________ times a day. Single leg stand with reaching This exercise is also called eccentric hamstring stretch. 1. Stand on your left / right foot. Keep your big toe down on the floor and try to keep your arch lifted. 2. Slowly reach down toward the floor as far as you can while keeping your balance. Lowering your thigh under tension is called eccentric stretching. 3. Hold this position for __________ seconds. Repeat __________ times. Complete this exercise __________  times a day. Plank, prone This exercise strengthens muscles in your abdomen and core area. 1. Lie on your abdomen on the floor (prone position),and prop yourself up on your elbows. Your hands should be straight out in front of you, and your elbows should be below your shoulders. Position your feet similar to a push-up position so your toes are on the ground. 2. Tighten your abdominal muscles and lift your body off the floor. ? Do not arch your back. ? Do not hold your breath. 3. Hold this position for __________ seconds. Repeat __________ times. Complete this exercise __________ times a day. This information is not intended to replace advice given to you by your health care provider. Make sure you discuss any questions you have with your health care provider. Document Revised: 09/14/2018 Document Reviewed: 05/22/2018 Elsevier Patient Education  2021 [employer].

## 2020-08-25 NOTE — Progress Notes (Signed)
Established patient visit   Patient: Lori [name]   DOB: [birth year]   45 y.o. Female  MRN: [id number] Visit Date: 08/25/2020  Today's healthcare provider: Mar Daring, PA-C   Chief Complaint  Patient presents with  . Knee Pain   Subjective    Knee Pain  There was no injury mechanism. The pain is present in the right knee. The quality of the pain is described as aching. Pertinent negatives include no inability to bear weight, loss of motion, loss of sensation, muscle weakness, numbness or tingling. Nothing aggravates the symptoms. She has tried ice for the symptoms. The treatment provided no relief.      Patient Active Problem List   Diagnosis Date Noted  . Endometrial mass 01/18/2017  . DUB (dysfunctional uterine bleeding) 01/18/2017  . Bacterial vaginosis 01/18/2017  . Allergic rhinitis 06/03/2015  . Anxiety 06/03/2015  . Acute systolic heart failure ([place]) 06/03/2015  . Clinical depression 06/03/2015  . Diabetes mellitus, type 2 ([place]) 06/03/2015  . Acid reflux 06/03/2015  . History of biliary T-tube placement 06/03/2015  . HD (Hodgkin's disease) ([place]) 06/03/2015  . RAD (reactive airway disease) 06/03/2015  . Fast heart beat 06/03/2015  . Chronic systolic heart failure ([place]) 05/23/2014  . 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 Acquired hypothyroidism 04/05/2013   Past Medical History:  Diagnosis Date  . Bacterial vaginitis   . CHF (congestive heart failure) ([place])   . Complication of anesthesia    nausea/vomiting  . Depression   . Diabetes mellitus without complication ([place])   . History of abnormal mammogram 2013   @[facility] - NEG MRI AND BX OCT 2014 NORMAL  . History of mammogram 03/2015   BREAST MRI AT [facility] WNL  . History of Papanicolaou smear of cervix 10/08/2013; 08/04/15   -/-; ASCUS/HPV NEG;  . Hodgkin's disease ([place]) 1987   She had surgical resection of Lymph nodes and chemo + rad tx's.  Marland Kitchen Hypertension   . Hypothyroidism     2ND TO [place] TX  . PONV (postoperative nausea and vomiting)   . Vitamin D deficiency    Social History   Tobacco Use  . Smoking status: Former Smoker    Packs/day: 0.25    Years: 10.00    Pack years: 2.50    Types: Cigarettes    Quit date: 03/10/2016    Years since quitting: 4.4  . Smokeless tobacco: Never Used  Vaping Use  . Vaping Use: Never used  Substance Use Topics  . Alcohol use: Yes    Alcohol/week: 2.0 standard drinks    Types: 2 Glasses of wine per week    Comment: occasional  . Drug use: No   Allergies  Allergen Reactions  . Dapagliflozin Other (See Comments)    Wilder Glade) UTI  . Liraglutide Nausea Only    Victoza     Medications: Outpatient Medications Prior to Visit  Medication Sig  . ALPRAZolam (XANAX) 0.5 MG tablet TAKE 1 TABLET BY MOUTH AT BEDTIME AS NEEDED FOR ANXIETY AND 1/2 TAB DURING THE DAY FOR PANIC ATTACKS  . atorvastatin (LIPITOR) 20 MG tablet Take 20 mg by mouth at bedtime.  . BD PEN NEEDLE NANO U/F 32G X 4 MM MISC Use with Insulin injctions  . Continuous Blood Gluc Sensor (DEXCOM G6 SENSOR) MISC SMARTSIG:1 Each Topical Every 10 Days  . Continuous Blood Gluc Transmit (DEXCOM G6 TRANSMITTER) MISC USE TO MONITOR BLOOD SUGARS. CHANGE EVERY 90 DAYS.  Marland Kitchen CONTOUR NEXT TEST  test strip USE 2 TIMES DAILY TO TEST BLOOD SUGAR  . empagliflozin (JARDIANCE) 25 MG TABS tablet Take by mouth.  . Lancets (ONETOUCH DELICA PLUS [id number]) MISC USE LANCETS TO TEST BLOOD SUGARS THREE TIMES DAILY  . levonorgestrel (MIRENA) 20 MCG/24HR IUD 1 each by Intrauterine route once.  Marland Kitchen levothyroxine (SYNTHROID) 88 MCG tablet Take 88 mcg by mouth daily.  Marland Kitchen lisinopril (PRINIVIL,ZESTRIL) 5 MG tablet Take 5 mg by mouth at bedtime.   . meloxicam (MOBIC) 15 MG tablet Take 1 tablet (15 mg total) by mouth daily.  . metaxalone (SKELAXIN) 800 MG tablet Take 1 tablet (800 mg total) by mouth 3 (three) times daily.  . metFORMIN (GLUCOPHAGE-XR) 500 MG 24 hr tablet Take 1,000 mg by mouth 2 (two)  times daily.  . metoprolol succinate (TOPROL-XL) 50 MG 24 hr tablet Take 50 mg by mouth daily.  Marland Kitchen NOVOLOG MIX 70/30 FLEXPEN (70-30) 100 UNIT/ML FlexPen Inject 50 Units into the skin 2 (two) times daily.   . pantoprazole (PROTONIX) 40 MG tablet Take 1 tablet (40 mg total) by mouth daily.  . sertraline (ZOLOFT) 100 MG tablet TAKE 2 TABLETS BY MOUTH EVERY DAY  . Eszopiclone 3 MG TABS Take 1 tablet (3 mg total) by mouth at bedtime. Take immediately before bedtime   No facility-administered medications prior to visit.    Review of Systems  Constitutional: Negative.   Respiratory: Negative.   Gastrointestinal: Negative.   Musculoskeletal: Positive for arthralgias, back pain, gait problem and myalgias. Negative for joint swelling, neck pain and neck stiffness.  Neurological: Negative for dizziness, tingling, numbness and headaches.        Objective    BP 92/64 (BP Location: Right Arm, Patient Position: Sitting, Cuff Size: Large)   Pulse 81   Temp 97.7 F (36.5 C) (Oral)   Wt 155 lb (70.3 kg)   BMI 25.02 kg/m    Physical Exam Vitals reviewed.  Constitutional:      General: She is not in acute distress.    Appearance: Normal appearance. She is well-developed and normal weight.  HENT:     Head: Normocephalic and atraumatic.  Pulmonary:     Effort: Pulmonary effort is normal. No respiratory distress.  Musculoskeletal:     Cervical back: Normal range of motion and neck supple.     Right knee: No swelling, effusion, erythema, bony tenderness or crepitus. Decreased range of motion. Tenderness (over hamstring insertion on medial aspect) present. No LCL laxity, MCL laxity, ACL laxity or PCL laxity. Normal alignment, normal meniscus and normal patellar mobility. Normal pulse.     Instability Tests: Anterior drawer test negative. Posterior drawer test negative. Anterior Lachman test negative. Medial McMurray test negative and lateral McMurray test negative.     Left knee: Normal.   Neurological:     Mental Status: She is alert.  Psychiatric:        Mood and Affect: Mood normal.        Behavior: Behavior normal.        Thought Content: Thought content normal.        Judgment: Judgment normal.     No results found for any visits on 08/25/20.  Assessment & Plan     1. Hamstring strain, right, initial encounter Suspect hamstring strain, most tender over the semimembranosus tendon and up into mid muscle belly. Will give Medrol as below. Stretches and exercises printed for patient. Moist heat can help. Epsom salt soaks. Compression sleeve may help. Advised to monitor sugars with  medrol. She has dexcom meter so should be able to monitor closely. Call if not improving or worsening. Will refer to orthopedics if not improving.  - methylPREDNISolone (MEDROL) 4 MG TBPK tablet; 6 day taper; take as directed on package instructions  Dispense: 21 tablet; Refill: 0   No follow-ups on file.      Reynolds Bowl, PA-C, have reviewed all documentation for this visit. The documentation on 08/26/20 for the exam, diagnosis, procedures, and orders are all accurate and complete.   Rubye Beach  [facility] [phone] (phone) [phone] (fax)  [place]

## 2020-08-26 ENCOUNTER — Encounter: Payer: Self-pay | Admitting: Physician Assistant

## 2020-08-28 ENCOUNTER — Ambulatory Visit: Payer: BC Managed Care – PPO | Admitting: Family Medicine

## 2020-09-29 DIAGNOSIS — M7061 Trochanteric bursitis, right hip: Secondary | ICD-10-CM | POA: Diagnosis not present

## 2020-10-07 DIAGNOSIS — M7061 Trochanteric bursitis, right hip: Secondary | ICD-10-CM | POA: Diagnosis not present

## 2020-10-09 ENCOUNTER — Encounter: Payer: Self-pay | Admitting: Physician Assistant

## 2020-10-09 DIAGNOSIS — M7061 Trochanteric bursitis, right hip: Secondary | ICD-10-CM

## 2020-10-09 DIAGNOSIS — S73191A Other sprain of right hip, initial encounter: Secondary | ICD-10-CM | POA: Diagnosis not present

## 2020-10-10 NOTE — Telephone Encounter (Signed)
Please place [facility] referral. And she will need to talk to [facility] about getting MRI results sent to [facility]

## 2020-10-22 DIAGNOSIS — E1165 Type 2 diabetes mellitus with hyperglycemia: Secondary | ICD-10-CM | POA: Diagnosis not present

## 2020-10-22 DIAGNOSIS — E782 Mixed hyperlipidemia: Secondary | ICD-10-CM | POA: Diagnosis not present

## 2020-10-22 DIAGNOSIS — Z794 Long term (current) use of insulin: Secondary | ICD-10-CM | POA: Diagnosis not present

## 2020-10-22 DIAGNOSIS — E039 Hypothyroidism, unspecified: Secondary | ICD-10-CM | POA: Diagnosis not present

## 2020-10-23 NOTE — Progress Notes (Addendum)
Chief Complaint  Patient presents with  . Gynecologic Exam    No concerns     HPI:      Lori [name] is a 45 y.o. [phone] who LMP was No LMP recorded. (Menstrual status: IUD)., presents today for her annual examination.  Her menses are absent with IUD. Pt had DUB in past with endometrial mass on u/s, s/p hysteroscopy/D&C/polypectomy and new IUD placement with Dr. Glennon Mac 10/18. Doing well. No BTB/dysmen. Has bad vasomotor sx.   Sex activity: single partner, contraception - IUD. Mirena placed 03/10/17. Has dyspareunia, improved with lubricants. Last Pap: 01/18/17 Results were: no abnormalities /neg HPV DNA  Hx of STDs: none  Last mammogram: pt gets mammos/MRIs at [facility] yearly for hx of Breast mass/bx. Last mammo 02/13/20 was normal. Hx of Birads 3 3/21, breast MRI done 9/21 for RT breast was cat 2.  There is no FH of breast cancer. There is no FH of ovarian cancer. The patient does do self-breast exams.  Tobacco use: The patient denies current or previous tobacco use. Alcohol use: social No drug use Exercise: mod active  Complains of SUI and urge incont, with post void dribbling. Drinks 2 caffeine drinks daily, has done some kegels.   Colonoscopy: 4/20 at [facility] with polyps; pt high risk for CRC due to hx of para-aortic/splenic pedicle radiation for Hodgkins lymphoma age 89. Not sure when colonoscopy due again.   She does get adequate calcium but not Vitamin D in her diet. Diagnosed with Vit D deficiency in past.  She has labs with PCP. Followed by oncology for hx of Hodgkins and cardiology and endocrinology at [facility].    Past Medical History:  Diagnosis Date  . Bacterial vaginitis   . CHF (congestive heart failure) ([place])   . Colon polyps   . Complication of anesthesia    nausea/vomiting  . Depression   . Diabetes mellitus without complication ([place])   . History of abnormal mammogram 2013   @[facility] - NEG MRI AND BX OCT 2014 NORMAL  . History of mammogram 03/2015   BREAST MRI AT  [facility] WNL  . History of Papanicolaou smear of cervix 10/08/2013; 08/04/15   -/-; ASCUS/HPV NEG;  . Hodgkin's disease ([place]) 1987   She had surgical resection of Lymph nodes and chemo + rad tx's.  Marland Kitchen Hypertension   . Hypothyroidism    2ND TO CA TX  . PONV (postoperative nausea and vomiting)   . Restrictive lung disease   . Vitamin D deficiency     Past Surgical History:  Procedure Laterality Date  . CHOLECYSTECTOMY    . DEEP NECK LYMPH NODE BIOPSY / EXCISION  1987   hodgkin lymphoma  . DILATATION & CURETTAGE/HYSTEROSCOPY WITH MYOSURE N/A 03/10/2017   Procedure: DILATATION & CURETTAGE/HYSTEROSCOPY WITH MYOSURE;  Surgeon: Will Bonnet, MD;  Location: [facility] ORS;  Service: Gynecology;  Laterality: N/A;  . INTRAUTERINE DEVICE (IUD) INSERTION  [id number]; 02/06/09; 12/12/13  . INTRAUTERINE DEVICE (IUD) INSERTION N/A 03/10/2017   Procedure: INTRAUTERINE DEVICE (IUD) INSERTION;  Surgeon: Will Bonnet, MD;  Location: [facility] ORS;  Service: Gynecology;  Laterality: N/A;  . SPLENECTOMY    . THYROIDECTOMY      Family History  Problem Relation Age of Onset  . Hypertension Mother   . Other Maternal Grandfather        bile duct cancer  . Colon cancer Paternal Grandmother        38-70    Social History   Socioeconomic History  .  Marital status: Married    Spouse name: Not on file  . Number of children: Not on file  . Years of education: 16  . Highest education level: Not on file  Occupational History  . Occupation: QUOTATION SPEC    Employer: [employer]  Tobacco Use  . Smoking status: Former Smoker    Packs/day: 0.25    Years: 10.00    Pack years: 2.50    Types: Cigarettes    Quit date: 03/10/2016    Years since quitting: 4.6  . Smokeless tobacco: Never Used  Vaping Use  . Vaping Use: Never used  Substance and Sexual Activity  . Alcohol use: Yes    Alcohol/week: 2.0 standard drinks    Types: 2 Glasses of wine per week    Comment: occasional  . Drug use: No  . Sexual  activity: Yes    Birth control/protection: I.U.D.    Comment: Mirena  Other Topics Concern  . Not on file  Social History Narrative  . Not on file   Social Determinants of Health   Financial Resource Strain: Not on file  Food Insecurity: Not on file  Transportation Needs: Not on file  Physical Activity: Not on file  Stress: Not on file  Social Connections: Not on file  Intimate Partner Violence: Not on file     Current Outpatient Medications:  .  ALPRAZolam (XANAX) 0.5 MG tablet, TAKE 1 TABLET BY MOUTH AT BEDTIME AS NEEDED FOR ANXIETY AND 1/2 TAB DURING THE DAY FOR PANIC ATTACKS, Disp: 45 tablet, Rfl: 3 .  atorvastatin (LIPITOR) 20 MG tablet, Take 20 mg by mouth at bedtime., Disp: , Rfl:  .  BD PEN NEEDLE NANO U/F 32G X 4 MM MISC, Use with Insulin injctions, Disp: 100 each, Rfl: 10 .  Continuous Blood Gluc Sensor (DEXCOM G6 SENSOR) MISC, SMARTSIG:1 Each Topical Every 10 Days, Disp: , Rfl:  .  Continuous Blood Gluc Transmit (DEXCOM G6 TRANSMITTER) MISC, USE TO MONITOR BLOOD SUGARS. CHANGE EVERY 90 DAYS., Disp: , Rfl:  .  CONTOUR NEXT TEST test strip, USE 2 TIMES DAILY TO TEST BLOOD SUGAR, Disp: , Rfl: 12 .  empagliflozin (JARDIANCE) 25 MG TABS tablet, Take by mouth., Disp: , Rfl:  .  Lancets (ONETOUCH DELICA PLUS [id number]) MISC, USE LANCETS TO TEST BLOOD SUGARS THREE TIMES DAILY, Disp: , Rfl:  .  levonorgestrel (MIRENA) 20 MCG/24HR IUD, 1 each by Intrauterine route once., Disp: , Rfl:  .  levothyroxine (SYNTHROID) 88 MCG tablet, Take 88 mcg by mouth daily., Disp: , Rfl:  .  lisinopril (PRINIVIL,ZESTRIL) 5 MG tablet, Take 5 mg by mouth at bedtime. , Disp: , Rfl:  .  metFORMIN (GLUCOPHAGE-XR) 500 MG 24 hr tablet, Take 1,000 mg by mouth 2 (two) times daily., Disp: , Rfl:  .  metoprolol succinate (TOPROL-XL) 50 MG 24 hr tablet, Take 50 mg by mouth daily., Disp: , Rfl:  .  NOVOLOG MIX 70/30 FLEXPEN (70-30) 100 UNIT/ML FlexPen, Inject 50 Units into the skin 2 (two) times daily. , Disp: ,  Rfl: 0 .  pantoprazole (PROTONIX) 20 MG tablet, Protonix 20 mg tablet,delayed release, Disp: , Rfl:  .  sertraline (ZOLOFT) 100 MG tablet, TAKE 2 TABLETS BY MOUTH EVERY DAY, Disp: 180 tablet, Rfl: 1  ROS:  Review of Systems  Constitutional: Negative for fatigue, fever and unexpected weight change.  Respiratory: Negative for cough, shortness of breath and wheezing.   Cardiovascular: Negative for chest pain, palpitations and leg swelling.  Gastrointestinal: Negative  for blood in stool, constipation, diarrhea, nausea and vomiting.  Endocrine: Negative for cold intolerance, heat intolerance and polyuria.  Genitourinary: Positive for dyspareunia. Negative for dysuria, flank pain, frequency, genital sores, hematuria, menstrual problem, pelvic pain, urgency, vaginal bleeding and vaginal discharge.  Musculoskeletal: Negative for back pain, joint swelling and myalgias.  Skin: Negative for rash.  Neurological: Negative for dizziness, syncope, light-headedness, numbness and headaches.  Hematological: Negative for adenopathy.  Psychiatric/Behavioral: Negative for agitation, confusion, sleep disturbance and suicidal ideas. The patient is not nervous/anxious.      Objective: BP 110/80   Ht 5\' 6"  (1.676 m)   Wt 151 lb (68.5 kg)   BMI 24.37 kg/m    Physical Exam Constitutional:      Appearance: She is well-developed.  Genitourinary:     Vulva normal.     Right Labia: No rash, tenderness or lesions.    Left Labia: No tenderness, lesions or rash.    No vaginal discharge, erythema or tenderness.      Right Adnexa: not tender and no mass present.    Left Adnexa: not tender and no mass present.    No cervical motion tenderness, friability or polyp.     IUD strings visualized.     Uterus is not enlarged or tender.  Breasts:     Right: No mass, nipple discharge, skin change or tenderness.     Left: No mass, nipple discharge, skin change or tenderness.    Neck:     Thyroid: No thyromegaly.   Cardiovascular:     Rate and Rhythm: Normal rate and regular rhythm.     Heart sounds: Normal heart sounds. No murmur heard.   Pulmonary:     Effort: Pulmonary effort is normal.     Breath sounds: Normal breath sounds.  Abdominal:     Palpations: Abdomen is soft.     Tenderness: There is no abdominal tenderness. There is no guarding or rebound.  Musculoskeletal:        General: Normal range of motion.     Cervical back: Normal range of motion.  Lymphadenopathy:     Cervical: No cervical adenopathy.  Neurological:     General: No focal deficit present.     Mental Status: She is alert and oriented to person, place, and time.     Cranial Nerves: No cranial nerve deficit.  Skin:    General: Skin is warm and dry.  Psychiatric:        Mood and Affect: Mood normal.        Behavior: Behavior normal.        Thought Content: Thought content normal.        Judgment: Judgment normal.  Vitals reviewed.     Assessment/Plan: Encounter for annual routine gynecological examination  Encounter for routine checking of intrauterine contraceptive device (IUD)--IUD strings in place, due for rem 10/25.  Encounter for screening mammogram for malignant neoplasm of breast--pt sched at [facility].  Screening for colon cancer--pt with hx of polyps and increased risk of CRC. Pt to f/u with [facility] to see when due for repeat colonoscopy.   Perimenopausal vasomotor symptoms--add estroven after confirming no med interaction with pharmacist.   Mixed incontinence urge and stress--kegels, d/c caffeine, increase voiding frequency. Can do pelvic PT ref and add OAB meds, but pt not interested currently. F/u prn.               GYN counsel adequate intake of calcium and vitamin D  F/U  Return in about 1 year (around 10/27/2021).  Lillith Mcneff B. Zosia Lucchese, PA-C 10/27/2020 9:41 AM

## 2020-10-27 ENCOUNTER — Ambulatory Visit (INDEPENDENT_AMBULATORY_CARE_PROVIDER_SITE_OTHER): Payer: BC Managed Care – PPO | Admitting: Obstetrics and Gynecology

## 2020-10-27 ENCOUNTER — Other Ambulatory Visit: Payer: Self-pay

## 2020-10-27 ENCOUNTER — Encounter: Payer: Self-pay | Admitting: Obstetrics and Gynecology

## 2020-10-27 VITALS — BP 110/80 | Ht 66.0 in | Wt 151.0 lb

## 2020-10-27 DIAGNOSIS — Z30431 Encounter for routine checking of intrauterine contraceptive device: Secondary | ICD-10-CM

## 2020-10-27 DIAGNOSIS — Z01419 Encounter for gynecological examination (general) (routine) without abnormal findings: Secondary | ICD-10-CM | POA: Diagnosis not present

## 2020-10-27 DIAGNOSIS — N951 Menopausal and female climacteric states: Secondary | ICD-10-CM

## 2020-10-27 DIAGNOSIS — Z1231 Encounter for screening mammogram for malignant neoplasm of breast: Secondary | ICD-10-CM

## 2020-10-27 DIAGNOSIS — Z1211 Encounter for screening for malignant neoplasm of colon: Secondary | ICD-10-CM | POA: Diagnosis not present

## 2020-10-27 DIAGNOSIS — N3946 Mixed incontinence: Secondary | ICD-10-CM | POA: Insufficient documentation

## 2020-10-27 NOTE — Patient Instructions (Signed)
I value your feedback and you entrusting us with your care. If you get a [place] patient survey, I would appreciate you taking the time to let us know about your experience today. Thank you! ? ? ?

## 2020-10-29 DIAGNOSIS — M25551 Pain in right hip: Secondary | ICD-10-CM | POA: Diagnosis not present

## 2020-10-29 DIAGNOSIS — M7601 Gluteal tendinitis, right hip: Secondary | ICD-10-CM | POA: Diagnosis not present

## 2020-10-29 DIAGNOSIS — E1165 Type 2 diabetes mellitus with hyperglycemia: Secondary | ICD-10-CM | POA: Diagnosis not present

## 2020-10-29 DIAGNOSIS — M7061 Trochanteric bursitis, right hip: Secondary | ICD-10-CM | POA: Diagnosis not present

## 2020-10-29 DIAGNOSIS — Z794 Long term (current) use of insulin: Secondary | ICD-10-CM | POA: Diagnosis not present

## 2020-11-06 DIAGNOSIS — E782 Mixed hyperlipidemia: Secondary | ICD-10-CM | POA: Diagnosis not present

## 2020-11-06 DIAGNOSIS — E559 Vitamin D deficiency, unspecified: Secondary | ICD-10-CM | POA: Diagnosis not present

## 2020-11-06 DIAGNOSIS — E1165 Type 2 diabetes mellitus with hyperglycemia: Secondary | ICD-10-CM | POA: Diagnosis not present

## 2020-11-06 DIAGNOSIS — E039 Hypothyroidism, unspecified: Secondary | ICD-10-CM | POA: Diagnosis not present

## 2020-11-12 ENCOUNTER — Other Ambulatory Visit: Payer: Self-pay

## 2020-11-12 ENCOUNTER — Other Ambulatory Visit: Payer: Self-pay | Admitting: Physician Assistant

## 2020-11-12 DIAGNOSIS — M62830 Muscle spasm of back: Secondary | ICD-10-CM

## 2020-11-12 DIAGNOSIS — F330 Major depressive disorder, recurrent, mild: Secondary | ICD-10-CM

## 2020-11-12 MED ORDER — SERTRALINE HCL 100 MG PO TABS: 2.0000 | ORAL_TABLET | Freq: Every day | ORAL | 1 refills | Status: DC

## 2020-11-26 DIAGNOSIS — Z794 Long term (current) use of insulin: Secondary | ICD-10-CM | POA: Diagnosis not present

## 2020-11-26 DIAGNOSIS — E1165 Type 2 diabetes mellitus with hyperglycemia: Secondary | ICD-10-CM | POA: Diagnosis not present

## 2020-11-26 DIAGNOSIS — R1010 Upper abdominal pain, unspecified: Secondary | ICD-10-CM | POA: Diagnosis not present

## 2020-12-04 DIAGNOSIS — M7061 Trochanteric bursitis, right hip: Secondary | ICD-10-CM | POA: Diagnosis not present

## 2020-12-15 DIAGNOSIS — M7061 Trochanteric bursitis, right hip: Secondary | ICD-10-CM | POA: Diagnosis not present

## 2020-12-18 DIAGNOSIS — M7061 Trochanteric bursitis, right hip: Secondary | ICD-10-CM | POA: Diagnosis not present

## 2021-01-08 DIAGNOSIS — R112 Nausea with vomiting, unspecified: Secondary | ICD-10-CM | POA: Diagnosis not present

## 2021-01-08 DIAGNOSIS — Y842 Radiological procedure and radiotherapy as the cause of abnormal reaction of the patient, or of later complication, without mention of misadventure at the time of the procedure: Secondary | ICD-10-CM | POA: Diagnosis not present

## 2021-01-08 DIAGNOSIS — T66XXXS Radiation sickness, unspecified, sequela: Secondary | ICD-10-CM | POA: Diagnosis not present

## 2021-01-08 DIAGNOSIS — C8110 Nodular sclerosis classical Hodgkin lymphoma, unspecified site: Secondary | ICD-10-CM | POA: Diagnosis not present

## 2021-01-08 DIAGNOSIS — R309 Painful micturition, unspecified: Secondary | ICD-10-CM | POA: Diagnosis not present

## 2021-01-08 DIAGNOSIS — Z87440 Personal history of urinary (tract) infections: Secondary | ICD-10-CM | POA: Diagnosis not present

## 2021-01-08 DIAGNOSIS — R5383 Other fatigue: Secondary | ICD-10-CM | POA: Diagnosis not present

## 2021-01-29 DIAGNOSIS — E1165 Type 2 diabetes mellitus with hyperglycemia: Secondary | ICD-10-CM | POA: Diagnosis not present

## 2021-01-29 DIAGNOSIS — Z794 Long term (current) use of insulin: Secondary | ICD-10-CM | POA: Diagnosis not present

## 2021-01-29 DIAGNOSIS — E890 Postprocedural hypothyroidism: Secondary | ICD-10-CM | POA: Diagnosis not present

## 2021-01-29 DIAGNOSIS — E1159 Type 2 diabetes mellitus with other circulatory complications: Secondary | ICD-10-CM | POA: Diagnosis not present

## 2021-01-29 DIAGNOSIS — E1169 Type 2 diabetes mellitus with other specified complication: Secondary | ICD-10-CM | POA: Diagnosis not present

## 2021-02-13 DIAGNOSIS — Z1239 Encounter for other screening for malignant neoplasm of breast: Secondary | ICD-10-CM | POA: Diagnosis not present

## 2021-02-13 DIAGNOSIS — Z1231 Encounter for screening mammogram for malignant neoplasm of breast: Secondary | ICD-10-CM | POA: Diagnosis not present

## 2021-02-13 DIAGNOSIS — R922 Inconclusive mammogram: Secondary | ICD-10-CM | POA: Diagnosis not present

## 2021-02-13 DIAGNOSIS — C8110 Nodular sclerosis classical Hodgkin lymphoma, unspecified site: Secondary | ICD-10-CM | POA: Diagnosis not present

## 2021-02-13 DIAGNOSIS — Y842 Radiological procedure and radiotherapy as the cause of abnormal reaction of the patient, or of later complication, without mention of misadventure at the time of the procedure: Secondary | ICD-10-CM | POA: Diagnosis not present

## 2021-02-13 DIAGNOSIS — T66XXXS Radiation sickness, unspecified, sequela: Secondary | ICD-10-CM | POA: Diagnosis not present

## 2021-02-22 ENCOUNTER — Other Ambulatory Visit: Payer: Self-pay | Admitting: Physician Assistant

## 2021-04-01 ENCOUNTER — Other Ambulatory Visit: Payer: Self-pay

## 2021-04-01 ENCOUNTER — Telehealth: Payer: Self-pay | Admitting: Physician Assistant

## 2021-04-01 DIAGNOSIS — M62830 Muscle spasm of back: Secondary | ICD-10-CM

## 2021-04-01 MED ORDER — CYCLOBENZAPRINE HCL 5 MG PO TABS: 5.0000 mg | ORAL_TABLET | Freq: Three times a day (TID) | ORAL | 0 refills | Status: DC | PRN

## 2021-04-01 NOTE — Telephone Encounter (Signed)
[facility] faxed refill request for the following medications:  cyclobenzaprine (FLEXERIL) 5 MG tablet     Please advise.  

## 2021-04-06 ENCOUNTER — Other Ambulatory Visit: Payer: Self-pay | Admitting: Orthopedic Surgery

## 2021-04-06 ENCOUNTER — Other Ambulatory Visit (HOSPITAL_COMMUNITY): Payer: Self-pay | Admitting: Orthopedic Surgery

## 2021-04-06 DIAGNOSIS — M7061 Trochanteric bursitis, right hip: Secondary | ICD-10-CM

## 2021-04-06 DIAGNOSIS — M7601 Gluteal tendinitis, right hip: Secondary | ICD-10-CM | POA: Diagnosis not present

## 2021-04-09 ENCOUNTER — Ambulatory Visit
Admission: RE | Admit: 2021-04-09 | Discharge: 2021-04-09 | Disposition: A | Discharge status: Home / Self Care | Payer: BC Managed Care – PPO | Source: Ambulatory Visit | Attending: Orthopedic Surgery | Admitting: Orthopedic Surgery

## 2021-04-09 ENCOUNTER — Other Ambulatory Visit: Payer: Self-pay

## 2021-04-09 DIAGNOSIS — M7601 Gluteal tendinitis, right hip: Secondary | ICD-10-CM | POA: Insufficient documentation

## 2021-04-09 DIAGNOSIS — M7061 Trochanteric bursitis, right hip: Secondary | ICD-10-CM | POA: Diagnosis not present

## 2021-04-09 DIAGNOSIS — M25551 Pain in right hip: Secondary | ICD-10-CM | POA: Diagnosis not present

## 2021-04-29 DIAGNOSIS — M7061 Trochanteric bursitis, right hip: Secondary | ICD-10-CM | POA: Diagnosis not present

## 2021-04-29 DIAGNOSIS — M25551 Pain in right hip: Secondary | ICD-10-CM | POA: Diagnosis not present

## 2021-05-01 ENCOUNTER — Other Ambulatory Visit: Payer: Self-pay | Admitting: Family Medicine

## 2021-05-01 DIAGNOSIS — M62830 Muscle spasm of back: Secondary | ICD-10-CM

## 2021-05-06 ENCOUNTER — Other Ambulatory Visit: Payer: Self-pay | Admitting: Physician Assistant

## 2021-05-06 DIAGNOSIS — F411 Generalized anxiety disorder: Secondary | ICD-10-CM

## 2021-05-18 NOTE — Telephone Encounter (Signed)
LOV: 08/25/2020  NOV: None   Last Refill: 08/21/2020 #45 3 Refills   Thanks,   -Mickel Baas

## 2021-05-20 DIAGNOSIS — E1159 Type 2 diabetes mellitus with other circulatory complications: Secondary | ICD-10-CM | POA: Diagnosis not present

## 2021-05-20 DIAGNOSIS — Z794 Long term (current) use of insulin: Secondary | ICD-10-CM | POA: Diagnosis not present

## 2021-05-20 DIAGNOSIS — E1165 Type 2 diabetes mellitus with hyperglycemia: Secondary | ICD-10-CM | POA: Diagnosis not present

## 2021-05-20 DIAGNOSIS — E890 Postprocedural hypothyroidism: Secondary | ICD-10-CM | POA: Diagnosis not present

## 2021-05-20 DIAGNOSIS — E039 Hypothyroidism, unspecified: Secondary | ICD-10-CM | POA: Diagnosis not present

## 2021-05-20 LAB — HEMOGLOBIN A1C
Hemoglobin A1C: 7.6
Hemoglobin A1C: 7.6

## 2021-05-22 LAB — HEMOGLOBIN A1C: Hemoglobin A1C: 7.6

## 2021-06-01 ENCOUNTER — Other Ambulatory Visit: Payer: Self-pay | Admitting: Family Medicine

## 2021-06-01 DIAGNOSIS — M62830 Muscle spasm of back: Secondary | ICD-10-CM

## 2021-06-21 ENCOUNTER — Other Ambulatory Visit: Payer: Self-pay | Admitting: Family Medicine

## 2021-06-21 DIAGNOSIS — F411 Generalized anxiety disorder: Secondary | ICD-10-CM

## 2021-06-21 NOTE — Telephone Encounter (Signed)
Requested medication (s) are due for refill today: yes  Requested medication (s) are on the active medication list: yes  Last refill:  05/19/21 #45   Future visit scheduled: no  Notes to clinic:  med not delegated to NT to RF   Requested Prescriptions  Pending Prescriptions Disp Refills   ALPRAZolam (XANAX) 0.5 MG tablet [Pharmacy Med Name: ALPRAZOLAM 0.5 MG TABLET] 45 tablet 0    Sig: TAKE 1 TABLET BY MOUTH AT BEDTIME AS NEEDED FOR ANXIETY AND 1/2 TAB DURING THE DAY FOR PANIC ATTACKS     Not Delegated - Psychiatry:  Anxiolytics/Hypnotics Failed - 06/21/2021 12:13 PM      Failed - This refill cannot be delegated      Failed - Urine Drug Screen completed in last 360 days      Failed - Valid encounter within last 6 months    Recent Outpatient Visits           10 months ago Hamstring strain, right, initial encounter   [place], [place]   10 months ago Chronic right-sided low back pain with right-sided sciatica   [place], [place]   1 year ago Upper respiratory tract infection, unspecified type   [facility], Kelby Aline, FNP   1 year ago External hemorrhoid   [place], [place]   2 years ago Mild episode of recurrent major depressive disorder [facility])   [facility], [place], [place]

## 2021-06-30 DIAGNOSIS — E119 Type 2 diabetes mellitus without complications: Secondary | ICD-10-CM | POA: Diagnosis not present

## 2021-06-30 LAB — HM DIABETES EYE EXAM

## 2021-07-06 ENCOUNTER — Encounter: Payer: Self-pay | Admitting: Physician Assistant

## 2021-07-06 ENCOUNTER — Other Ambulatory Visit: Payer: Self-pay

## 2021-07-06 ENCOUNTER — Ambulatory Visit: Payer: BC Managed Care – PPO | Admitting: Physician Assistant

## 2021-07-06 VITALS — BP 92/63 | HR 91 | Temp 97.8°F | Ht 66.0 in | Wt 146.5 lb

## 2021-07-06 DIAGNOSIS — E119 Type 2 diabetes mellitus without complications: Secondary | ICD-10-CM

## 2021-07-06 DIAGNOSIS — G4700 Insomnia, unspecified: Secondary | ICD-10-CM | POA: Diagnosis not present

## 2021-07-06 DIAGNOSIS — S73191A Other sprain of right hip, initial encounter: Secondary | ICD-10-CM | POA: Insufficient documentation

## 2021-07-06 DIAGNOSIS — F411 Generalized anxiety disorder: Secondary | ICD-10-CM | POA: Diagnosis not present

## 2021-07-06 MED ORDER — HYDROXYZINE PAMOATE 25 MG PO CAPS: 25.0000 mg | ORAL_CAPSULE | Freq: Every evening | ORAL | 0 refills | Status: DC | PRN

## 2021-07-06 MED ORDER — SERTRALINE HCL 50 MG PO TABS: 50.0000 mg | ORAL_TABLET | Freq: Every day | ORAL | 1 refills | Status: DC

## 2021-07-06 MED ORDER — SERTRALINE HCL 100 MG PO TABS: 100.0000 mg | ORAL_TABLET | Freq: Every day | ORAL | 1 refills | Status: DC

## 2021-07-06 NOTE — Assessment & Plan Note (Signed)
Managed by [facility] clinic

## 2021-07-06 NOTE — Progress Notes (Signed)
I,Sha'taria Tyson,acting as a Education administrator for [employer], PA-C.,have documented all relevant documentation on the behalf of [name], PA-C,as directed by  [name], PA-C while in the presence of [name], PA-C.   Established patient visit   Patient: Lori [name]   DOB: 30-Mar-1976   46 y.o. Female  MRN: [id number] Visit Date: 07/06/2021  Today's healthcare provider: [name], PA-C   Chief Complaint  Patient presents with   Follow-up   Medication Refill   Anxiety   Panic Attack   Subjective    HPI  -Checks in on diabetes at [facility] clinic and was seen last month; 7.7% A1c, switch was made to glimepiride and pt wears a continuous monitor.   -Patient reports currently having back pains worse than what they were. Radiates down to but, hip and back of leg located all on the right side. Takes flexeril but does not seem to relax the muscle and reports still having pain if she uses heating pad. Reports taking motrin sometimes with mild relief but not for long. Following w/ ortho on Wednesday.  Anxiety, Follow-up  She was last seen for anxiety 2 years ago. Changes made at last visit include continue current treatment.   She reports good compliance with treatment. She reports good tolerance of treatment. She is not having side effects.   She feels her anxiety is moderate and Worse since last visit. Takes 100 mg sertraline nightly Takes xanax once every two weeks takes at night to sleep, maybe she will fall asleep but doesn't stay asleep   Overall her symptoms of anxiety feel much worse, fears of things she hears on the news -- car accidents, getting attacked out at a store.  Worse since October. Reports seeing a therapist in the past but does not currently.  Symptoms: No chest pain No difficulty concentrating  No dizziness No fatigue  No feelings of losing control Yes insomnia  Yes irritable No palpitations  Yes panic attacks Yes racing thoughts  No  shortness of breath Yes sweating  No tremors/shakes    GAD-7 Results GAD-7 Generalized Anxiety Disorder Screening Tool 07/06/2021  1. Feeling Nervous, Anxious, or on Edge 2  2. Not Being Able to Stop or Control Worrying 2  3. Worrying Too Much About Different Things 3  4. Trouble Relaxing 2  5. Being So Restless it's Hard To Sit Still 2  6. Becoming Easily Annoyed or Irritable 2  7. Feeling Afraid As If Something Awful Might Happen 2  Total GAD-7 Score 15  Difficulty At Work, Home, or Getting  Along With Others? Somewhat difficult    PHQ-9 Scores PHQ9 SCORE ONLY 07/06/2021 08/25/2020 08/04/2020  PHQ-9 Total Score 6 0 8    Medications: Outpatient Medications Prior to Visit  Medication Sig   ALPRAZolam (XANAX) 0.5 MG tablet TAKE 1 TABLET BY MOUTH AT BEDTIME AS NEEDED FOR ANXIETY AND 1/2 TAB DURING THE DAY FOR PANIC ATTACKS   atorvastatin (LIPITOR) 20 MG tablet Take 20 mg by mouth at bedtime.   BD PEN NEEDLE NANO U/F 32G X 4 MM MISC Use with Insulin injctions   Continuous Blood Gluc Sensor (DEXCOM G6 SENSOR) MISC SMARTSIG:1 Each Topical Every 10 Days   Continuous Blood Gluc Transmit (DEXCOM G6 TRANSMITTER) MISC USE TO MONITOR BLOOD SUGARS. CHANGE EVERY 90 DAYS.   cyclobenzaprine (FLEXERIL) 5 MG tablet TAKE 1 TABLET BY MOUTH THREE TIMES A DAY AS NEEDED FOR MUSCLE SPASMS   empagliflozin (JARDIANCE) 25 MG TABS tablet  Take by mouth.   levonorgestrel (MIRENA) 20 MCG/24HR IUD 1 each by Intrauterine route once.   levothyroxine (SYNTHROID) 88 MCG tablet Take 88 mcg by mouth daily.   lisinopril (PRINIVIL,ZESTRIL) 5 MG tablet Take 5 mg by mouth at bedtime.    metFORMIN (GLUCOPHAGE-XR) 500 MG 24 hr tablet Take 1,000 mg by mouth 2 (two) times daily.   metoprolol succinate (TOPROL-XL) 50 MG 24 hr tablet Take 50 mg by mouth daily.   pantoprazole (PROTONIX) 40 MG tablet TAKE 1 TABLET BY MOUTH EVERY DAY   [DISCONTINUED] sertraline (ZOLOFT) 100 MG tablet Take 2 tablets (200 mg total) by mouth daily.    glimepiride (AMARYL) 2 MG tablet Take 2 mg by mouth every morning.   [DISCONTINUED] CONTOUR NEXT TEST test strip USE 2 TIMES DAILY TO TEST BLOOD SUGAR (Patient not taking: Reported on 07/06/2021)   [DISCONTINUED] Lancets (ONETOUCH DELICA PLUS [id number]) MISC USE LANCETS TO TEST BLOOD SUGARS THREE TIMES DAILY (Patient not taking: Reported on 07/06/2021)   [DISCONTINUED] NOVOLOG MIX 70/30 FLEXPEN (70-30) 100 UNIT/ML FlexPen Inject 50 Units into the skin 2 (two) times daily.  (Patient not taking: Reported on 07/06/2021)   [DISCONTINUED] pantoprazole (PROTONIX) 20 MG tablet Protonix 20 mg tablet,delayed release (Patient not taking: Reported on 07/06/2021)   No facility-administered medications prior to visit.    Review of Systems  Constitutional:  Negative for fatigue and fever.  Respiratory:  Negative for cough and shortness of breath.   Cardiovascular:  Negative for chest pain and leg swelling.  Gastrointestinal:  Negative for abdominal pain.  Neurological:  Negative for dizziness and headaches.  Psychiatric/Behavioral:  Positive for sleep disturbance. The patient is nervous/anxious.       Objective    BP 92/63 (BP Location: Right Arm, Patient Position: Sitting, Cuff Size: Normal)    Pulse 91    Temp 97.8 F (36.6 C) (Oral)    Ht 5\' 6"  (1.676 m)    Wt 146 lb 8 oz (66.5 kg)    SpO2 100%    BMI 23.65 kg/m    Physical Exam Constitutional:      General: She is awake.     Appearance: She is well-developed.  HENT:     Head: Normocephalic.  Eyes:     Conjunctiva/sclera: Conjunctivae normal.  Cardiovascular:     Rate and Rhythm: Normal rate and regular rhythm.     Heart sounds: Normal heart sounds.  Pulmonary:     Effort: Pulmonary effort is normal.     Breath sounds: Normal breath sounds.  Skin:    General: Skin is warm.  Neurological:     Mental Status: She is alert and oriented to person, place, and time.  Psychiatric:        Attention and Perception: Attention normal.        Mood  and Affect: Mood normal.        Speech: Speech normal.        Behavior: Behavior is cooperative.     Results for orders placed or performed in visit on 07/06/21  Hemoglobin A1c  Result Value Ref Range   Hemoglobin A1C 7.6   Hemoglobin A1c  Result Value Ref Range   Hemoglobin A1C 7.6%   Hemoglobin A1c  Result Value Ref Range   Hemoglobin A1C 7.6     Assessment & Plan     Problem List Items Addressed This Visit       Endocrine   Diabetes mellitus, type 2 ([place])    Managed by  [facility]      Relevant Medications   glimepiride (AMARYL) 2 MG tablet     Musculoskeletal and Integument   Tear of right acetabular labrum    Defer to ortho guidance on therapy        Other   GAD (generalized anxiety disorder) - Primary    Discussed differences btwn anxiety and depression Discussed how medication and therapy/counseling gives [place] the best chance to feel better. She thinks there is a program at work she can take advantage of--encouraged this, or to look through her insurance for covered providers. Discussed increasing sertraline from 100 mg daily to 150 mg daily.        Relevant Medications   sertraline (ZOLOFT) 100 MG tablet   sertraline (ZOLOFT) 50 MG tablet   hydrOXYzine (VISTARIL) 25 MG capsule   Insomnia    Related to anxiety Xanax not helpful for sleep, advised d/c and try vistatril 25-50 mg prn bedtime Advised SE of daytime drowsiness in some      Relevant Medications   hydrOXYzine (VISTARIL) 25 MG capsule     Return in about 6 weeks (around 08/17/2021) for anxiety.      I, [name], PA-C have reviewed all documentation for this visit. The documentation on  07/06/2021 for the exam, diagnosis, procedures, and orders are all accurate and complete.    [name], PA-C  [facility] [phone] (phone) [phone] (fax)  [place]

## 2021-07-06 NOTE — Assessment & Plan Note (Signed)
Defer to ortho guidance on therapy

## 2021-07-06 NOTE — Assessment & Plan Note (Signed)
Discussed differences btwn anxiety and depression Discussed how medication and therapy/counseling gives [place] the best chance to feel better. She thinks there is a program at work she can take advantage of--encouraged this, or to look through her insurance for covered providers. Discussed increasing sertraline from 100 mg daily to 150 mg daily.

## 2021-07-06 NOTE — Assessment & Plan Note (Signed)
Related to anxiety Xanax not helpful for sleep, advised d/c and try vistatril 25-50 mg prn bedtime Advised SE of daytime drowsiness in some

## 2021-07-08 DIAGNOSIS — S73191A Other sprain of right hip, initial encounter: Secondary | ICD-10-CM | POA: Diagnosis not present

## 2021-07-08 DIAGNOSIS — M25551 Pain in right hip: Secondary | ICD-10-CM | POA: Diagnosis not present

## 2021-07-08 DIAGNOSIS — S73191D Other sprain of right hip, subsequent encounter: Secondary | ICD-10-CM | POA: Diagnosis not present

## 2021-07-13 ENCOUNTER — Other Ambulatory Visit: Payer: Self-pay | Admitting: Family Medicine

## 2021-07-13 DIAGNOSIS — M62830 Muscle spasm of back: Secondary | ICD-10-CM

## 2021-07-13 DIAGNOSIS — F411 Generalized anxiety disorder: Secondary | ICD-10-CM

## 2021-07-13 NOTE — Telephone Encounter (Signed)
Requested medication (s) are due for refill today: yes  Requested medication (s) are on the active medication list: yes  Last refill:  05/19/21  Future visit scheduled: 08/25/21  Notes to clinic:  This medication can not be delegated, please assess.   Requested Prescriptions  Pending Prescriptions Disp Refills   ALPRAZolam (XANAX) 0.5 MG tablet [Pharmacy Med Name: ALPRAZOLAM 0.5 MG TABLET] 45 tablet 0    Sig: TAKE 1 TABLET BY MOUTH AT BEDTIME AS NEEDED FOR ANXIETY AND 1/2 TAB DURING THE DAY FOR PANIC ATTACKS     Not Delegated - Psychiatry: Anxiolytics/Hypnotics 2 Failed - 07/13/2021  9:40 PM      Failed - This refill cannot be delegated      Failed - Urine Drug Screen completed in last 360 days      Passed - Patient is not pregnant      Passed - Valid encounter within last 6 months    Recent Outpatient Visits           1 week ago GAD (generalized anxiety disorder)   [employer], [place], PA-C   10 months ago Hamstring strain, right, initial encounter   [place], [place]   11 months ago Chronic right-sided low back pain with right-sided sciatica   [place], [place] Sorrel, [place]   1 year ago Upper respiratory tract infection, unspecified type   [facility], Kelby Aline, FNP   1 year ago External hemorrhoid   [place], [place]ce Sor[place], [place]       Future Appointments             In 1 month Thedore Mins, Ria Comment, PA-C [employer], [place]   In 6 months Brendolyn Patty, MD [place]

## 2021-07-13 NOTE — Telephone Encounter (Signed)
Requested medication (s) are due for refill today: yes  Requested medication (s) are on the active medication list: yes  Last refill:  06/02/21  Future visit scheduled: 08/25/21  Notes to clinic:  This medication can not be delegated, please assess.   Requested Prescriptions  Pending Prescriptions Disp Refills   cyclobenzaprine (FLEXERIL) 5 MG tablet [Pharmacy Med Name: CYCLOBENZAPRINE 5 MG TABLET] 90 tablet 0    Sig: TAKE 1 TABLET BY MOUTH THREE TIMES A DAY AS NEEDED FOR MUSCLE SPASMS     Not Delegated - Analgesics:  Muscle Relaxants Failed - 07/13/2021  9:40 PM      Failed - This refill cannot be delegated      Passed - Valid encounter within last 6 months    Recent Outpatient Visits           1 week ago GAD (generalized anxiety disorder)   [facility] Thedore Mins, [place], PA-C   10 months ago Hamstring strain, right, initial encounter   [facility] [place], [place]   11 months ago Chronic right-sided low back pain with right-sided sciatica   [place], Clearnce Sorrel, [place]   1 year ago Upper respiratory tract infection, unspecified type   [facility], Kelby Aline, FNP   1 year ago External hemorrhoid   [place], Clearnce Sorrel, [place]       Future Appointments             In 1 month Thedore Mins, Ria Comment, PA-C [employer], [place]   In 6 months Brendolyn Patty, MD [place]

## 2021-07-14 NOTE — Telephone Encounter (Signed)
pdmp checked last fill 05/19/2021

## 2021-07-16 ENCOUNTER — Encounter: Payer: Self-pay | Admitting: Physician Assistant

## 2021-07-16 ENCOUNTER — Ambulatory Visit: Payer: Self-pay | Admitting: *Deleted

## 2021-07-16 NOTE — Telephone Encounter (Signed)
°  Summary: fell this morning/hurt tailbone   Pt fell this morning when she got up.  Pt fell on her kitchen floor when going to turn on coffee pot. Pt fell on her tailbone, and it hurts really bad. It has hurt all day. She said she does not know what to do. No nurses available.  Pt states ok for call back        Chief Complaint: requesting appt what to do for pain in tailbone after a fall Symptoms: pain in tailbone after fall , sore to stand but can walk  Frequency: this am  Pertinent Negatives: Patient denies numbness tingling. Pain not radiating to legs. Disposition: [] ED /[] Urgent Care (no appt availability in office) / [x] Appointment(In office/virtual)/ []  [place] [facility]/ [] Home Care/ [] Refused Recommended Disposition /[] [place] [facility]/ []  Follow-up with PCP Additional Notes:   Appt scheduled for 07/17/21     Reason for Disposition  MILD weakness (i.e., does not interfere with ability to work, go to school, normal activities) (Exception: mild weakness is a chronic symptom)  Answer Assessment - Initial Assessment Questions 1. MECHANISM: "How did the fall happen?"     Walking in kitchen to coffee maker,and legs just gave out  2. DOMESTIC VIOLENCE AND ELDER ABUSE SCREENING: "Did you fall because someone pushed you or tried to hurt you?" If Yes, ask: "Are you safe now?"     na 3. ONSET: "When did the fall happen?" (e.g., minutes, hours, or days ago)     This am 650 am  4. LOCATION: "What part of the body hit the ground?" (e.g., back, buttocks, head, hips, knees, hands, head, stomach)     Buttocks  5. INJURY: "Did you hurt (injure) yourself when you fell?" If Yes, ask: "What did you injure? Tell me more about this?" (e.g., body area; type of injury; pain severity)"     Yes tailbone injured  6. PAIN: "Is there any pain?" If Yes, ask: "How bad is the pain?" (e.g., Scale 1-10; or mild,  moderate, severe)   - NONE (0): No pain   - MILD (1-3): Doesn't interfere with normal  activities    - MODERATE (4-7): Interferes with normal activities or awakens from sleep    - SEVERE (8-10): Excruciating pain, unable to do any normal activities      Pain takes breath away , can walk but painful  7. SIZE: For cuts, bruises, or swelling, ask: "How large is it?" (e.g., inches or centimeters)      Na  8. PREGNANCY: "Is there any chance you are pregnant?" "When was your last menstrual period?"     na 9. OTHER SYMPTOMS: "Do you have any other symptoms?" (e.g., dizziness, fever, weakness; new onset or worsening).      No  10. CAUSE: "What do you think caused the fall (or falling)?" (e.g., tripped, dizzy spell)       na  Protocols used: Falls and [facility]

## 2021-07-17 ENCOUNTER — Encounter: Payer: Self-pay | Admitting: Family Medicine

## 2021-07-17 ENCOUNTER — Ambulatory Visit: Payer: BC Managed Care – PPO | Admitting: Family Medicine

## 2021-07-17 ENCOUNTER — Other Ambulatory Visit: Payer: Self-pay

## 2021-07-17 VITALS — BP 98/68 | HR 115 | Temp 98.7°F | Resp 12 | Wt 145.8 lb

## 2021-07-17 DIAGNOSIS — E119 Type 2 diabetes mellitus without complications: Secondary | ICD-10-CM | POA: Diagnosis not present

## 2021-07-17 DIAGNOSIS — Z8571 Personal history of Hodgkin lymphoma: Secondary | ICD-10-CM | POA: Diagnosis not present

## 2021-07-17 DIAGNOSIS — Z79899 Other long term (current) drug therapy: Secondary | ICD-10-CM | POA: Diagnosis not present

## 2021-07-17 DIAGNOSIS — Z87440 Personal history of urinary (tract) infections: Secondary | ICD-10-CM | POA: Diagnosis not present

## 2021-07-17 DIAGNOSIS — I519 Heart disease, unspecified: Secondary | ICD-10-CM | POA: Diagnosis not present

## 2021-07-17 DIAGNOSIS — M25559 Pain in unspecified hip: Secondary | ICD-10-CM | POA: Diagnosis not present

## 2021-07-17 DIAGNOSIS — I420 Dilated cardiomyopathy: Secondary | ICD-10-CM | POA: Diagnosis not present

## 2021-07-17 DIAGNOSIS — E785 Hyperlipidemia, unspecified: Secondary | ICD-10-CM | POA: Diagnosis not present

## 2021-07-17 DIAGNOSIS — Y838 Other surgical procedures as the cause of abnormal reaction of the patient, or of later complication, without mention of misadventure at the time of the procedure: Secondary | ICD-10-CM | POA: Diagnosis not present

## 2021-07-17 DIAGNOSIS — C8190 Hodgkin lymphoma, unspecified, unspecified site: Secondary | ICD-10-CM | POA: Diagnosis not present

## 2021-07-17 DIAGNOSIS — I427 Cardiomyopathy due to drug and external agent: Secondary | ICD-10-CM | POA: Diagnosis not present

## 2021-07-17 DIAGNOSIS — T451X5A Adverse effect of antineoplastic and immunosuppressive drugs, initial encounter: Secondary | ICD-10-CM | POA: Diagnosis not present

## 2021-07-17 DIAGNOSIS — I5022 Chronic systolic (congestive) heart failure: Secondary | ICD-10-CM | POA: Diagnosis not present

## 2021-07-17 DIAGNOSIS — Z7984 Long term (current) use of oral hypoglycemic drugs: Secondary | ICD-10-CM | POA: Diagnosis not present

## 2021-07-17 DIAGNOSIS — Z794 Long term (current) use of insulin: Secondary | ICD-10-CM | POA: Diagnosis not present

## 2021-07-17 DIAGNOSIS — Z87891 Personal history of nicotine dependence: Secondary | ICD-10-CM | POA: Diagnosis not present

## 2021-07-17 MED ORDER — OXYCODONE-ACETAMINOPHEN 5-325 MG PO TABS
1.0000 | ORAL_TABLET | ORAL | 0 refills | Status: DC | PRN
Start: 2021-07-17 — End: 2021-07-22

## 2021-07-17 NOTE — Progress Notes (Signed)
I,[name],acting as a scribe for Lelon Huh, MD.,have documented all relevant documentation on the behalf of Lelon Huh, MD,as directed by  Lelon Huh, MD while in the presence of Lelon Huh, MD.    Established patient visit   Patient: [name]   DOB: 04/15/1976   46 y.o. Female  MRN: [id number] Visit Date: 07/17/2021  Today's healthcare provider: Lelon Huh, MD   Chief Complaint  Patient presents with   Fall   Subjective    Fall Incident onset: yesterday. The fall occurred while standing (fell on kitchen floor landing on her tailbone). She landed on Hard floor. Associated symptoms include nausea. Pertinent negatives include no abdominal pain, fever or vomiting.   Patient has taken Motrin, but it has not provided any pain relief. Patient worsens with movement or sitting. She is able to sit without pain by adjust her position, usually leaning forward. Pain is primary over the ischial tuberosities, although  somewhat sore in the center.   Medications: Outpatient Medications Prior to Visit  Medication Sig   ALPRAZolam (XANAX) 0.5 MG tablet TAKE 1 TABLET BY MOUTH AT BEDTIME AS NEEDED FOR ANXIETY AND 1/2 TAB DURING THE DAY FOR PANIC ATTACKS. Do not take with other muscle relaxants   atorvastatin (LIPITOR) 20 MG tablet Take 20 mg by mouth at bedtime.   BD PEN NEEDLE NANO U/F 32G X 4 MM MISC Use with Insulin injctions   Continuous Blood Gluc Sensor (DEXCOM G6 SENSOR) MISC SMARTSIG:1 Each Topical Every 10 Days   Continuous Blood Gluc Transmit (DEXCOM G6 TRANSMITTER) MISC USE TO MONITOR BLOOD SUGARS. CHANGE EVERY 90 DAYS.   cyclobenzaprine (FLEXERIL) 5 MG tablet Take 1 tablet (5 mg total) by mouth 3 (three) times daily as needed for muscle spasms. Do not take with other muscle relaxants or sedatives   empagliflozin (JARDIANCE) 25 MG TABS tablet Take by mouth.   glimepiride (AMARYL) 2 MG tablet Take 2 mg by mouth every morning.   hydrOXYzine (VISTARIL) 25  MG capsule Take 1 capsule (25 mg total) by mouth at bedtime as needed. Take 1-2 capsules at bedtime as needed   levonorgestrel (MIRENA) 20 MCG/24HR IUD 1 each by Intrauterine route once.   levothyroxine (SYNTHROID) 88 MCG tablet Take 88 mcg by mouth daily.   lisinopril (PRINIVIL,ZESTRIL) 5 MG tablet Take 5 mg by mouth at bedtime.    metFORMIN (GLUCOPHAGE-XR) 500 MG 24 hr tablet Take 1,000 mg by mouth 2 (two) times daily.   metoprolol succinate (TOPROL-XL) 50 MG 24 hr tablet Take 50 mg by mouth daily.   pantoprazole (PROTONIX) 40 MG tablet TAKE 1 TABLET BY MOUTH EVERY DAY   sertraline (ZOLOFT) 100 MG tablet Take 1 tablet (100 mg total) by mouth daily.   sertraline (ZOLOFT) 50 MG tablet Take 1 tablet (50 mg total) by mouth daily. Take in addition to 100 mg   No facility-administered medications prior to visit.    Review of Systems  Constitutional:  Negative for appetite change, chills, fatigue and fever.  Respiratory:  Negative for chest tightness and shortness of breath.   Cardiovascular:  Negative for chest pain and palpitations.  Gastrointestinal:  Positive for nausea. Negative for abdominal pain and vomiting.  Musculoskeletal:        Tailbone pain  Neurological:  Negative for dizziness and weakness.      Objective    BP 98/68 (BP Location: Left Arm, Patient Position: Sitting, Cuff Size: Normal)    Pulse (!) 115  Temp 98.7 F (37.1 C) (Oral)    Resp 12    Wt 145 lb 12.8 oz (66.1 kg)    BMI 23.53 kg/m  {Show previous vital signs (optional):23777}  Physical Exam   General appearance: Well developed, well nourished female, cooperative and in no acute distress Head: Normocephalic, without obvious abnormality, atraumatic Respiratory: Respirations even and unlabored, normal respiratory rate Extremities: All extremities are intact.  Skin: Skin color, texture, turgor normal. No rashes seen  Psych: Appropriate mood and affect. Neurologic: Mental status: Alert, oriented to person,  place, and time, thought content appropriate.    Assessment & Plan    1. Ischial pain, unspecified laterality Secondary to fall onto buttocks, likely contusion. Recommend frequent use of ice packs for the next two days, then alternate with heat. Can take OTC ibuprofen per labs.   Prescription oxyCODONE-acetaminophen (PERCOCET/ROXICET) 5-325 MG tablet; Take 1 tablet by mouth every 4 (four) hours as needed for up to 5 days for severe pain.  Dispense: 10 tablet; Refill: 0   Advised it may get a little worse before getting better, but should notice improvement in 4-5 more days, and may take 2-3 weeks to heel completely. Otherwise is to call for order for x-rays.        The entirety of the information documented in the History of Present Illness, Review of Systems and Physical Exam were personally obtained by me. Portions of this information were initially documented by the CMA and reviewed by me for thoroughness and accuracy.     Lelon Huh, MD  [facility] [phone] (phone) [phone] (fax)  [place]

## 2021-07-22 ENCOUNTER — Encounter: Payer: Self-pay | Admitting: Family Medicine

## 2021-07-22 MED ORDER — OXYCODONE-ACETAMINOPHEN 5-325 MG PO TABS: 1.0000 | ORAL_TABLET | ORAL | 0 refills | Status: AC | PRN

## 2021-08-17 ENCOUNTER — Ambulatory Visit: Payer: Self-pay | Admitting: *Deleted

## 2021-08-17 DIAGNOSIS — R1084 Generalized abdominal pain: Secondary | ICD-10-CM | POA: Diagnosis not present

## 2021-08-17 DIAGNOSIS — Z7984 Long term (current) use of oral hypoglycemic drugs: Secondary | ICD-10-CM | POA: Diagnosis not present

## 2021-08-17 DIAGNOSIS — I509 Heart failure, unspecified: Secondary | ICD-10-CM | POA: Diagnosis not present

## 2021-08-17 DIAGNOSIS — R140 Abdominal distension (gaseous): Secondary | ICD-10-CM | POA: Diagnosis not present

## 2021-08-17 DIAGNOSIS — D72829 Elevated white blood cell count, unspecified: Secondary | ICD-10-CM | POA: Diagnosis not present

## 2021-08-17 DIAGNOSIS — Z9221 Personal history of antineoplastic chemotherapy: Secondary | ICD-10-CM | POA: Diagnosis not present

## 2021-08-17 DIAGNOSIS — R000 Tachycardia, unspecified: Secondary | ICD-10-CM | POA: Diagnosis not present

## 2021-08-17 DIAGNOSIS — E872 Acidosis, unspecified: Secondary | ICD-10-CM | POA: Diagnosis not present

## 2021-08-17 DIAGNOSIS — Z20822 Contact with and (suspected) exposure to covid-19: Secondary | ICD-10-CM | POA: Diagnosis not present

## 2021-08-17 DIAGNOSIS — E039 Hypothyroidism, unspecified: Secondary | ICD-10-CM | POA: Diagnosis not present

## 2021-08-17 DIAGNOSIS — R141 Gas pain: Secondary | ICD-10-CM | POA: Diagnosis not present

## 2021-08-17 DIAGNOSIS — R109 Unspecified abdominal pain: Secondary | ICD-10-CM | POA: Diagnosis not present

## 2021-08-17 DIAGNOSIS — T451X5A Adverse effect of antineoplastic and immunosuppressive drugs, initial encounter: Secondary | ICD-10-CM | POA: Diagnosis not present

## 2021-08-17 DIAGNOSIS — Z9049 Acquired absence of other specified parts of digestive tract: Secondary | ICD-10-CM | POA: Diagnosis not present

## 2021-08-17 DIAGNOSIS — R1031 Right lower quadrant pain: Secondary | ICD-10-CM | POA: Diagnosis not present

## 2021-08-17 DIAGNOSIS — R110 Nausea: Secondary | ICD-10-CM | POA: Diagnosis not present

## 2021-08-17 DIAGNOSIS — K219 Gastro-esophageal reflux disease without esophagitis: Secondary | ICD-10-CM | POA: Diagnosis not present

## 2021-08-17 DIAGNOSIS — C8190 Hodgkin lymphoma, unspecified, unspecified site: Secondary | ICD-10-CM | POA: Diagnosis not present

## 2021-08-17 DIAGNOSIS — E1169 Type 2 diabetes mellitus with other specified complication: Secondary | ICD-10-CM | POA: Diagnosis not present

## 2021-08-17 DIAGNOSIS — F419 Anxiety disorder, unspecified: Secondary | ICD-10-CM | POA: Diagnosis not present

## 2021-08-17 DIAGNOSIS — E785 Hyperlipidemia, unspecified: Secondary | ICD-10-CM | POA: Diagnosis not present

## 2021-08-17 NOTE — Telephone Encounter (Signed)
Reason for Disposition ? [1] SEVERE pain (e.g., excruciating) AND [2] present > 1 hour ? ?Answer Assessment - Initial Assessment Questions ?1. LOCATION: "Where does it hurt?"  ?    Upper pain- all across ?2. RADIATION: "Does the pain shoot anywhere else?" (e.g., chest, back) ?    Radiates downward ?3. ONSET: "When did the pain begin?" (e.g., minutes, hours or days ago)  ?    Saturday morning ?4. SUDDEN: "Gradual or sudden onset?" ?    sudden ?5. PATTERN "Does the pain come and go, or is it constant?" ?   - If constant: "Is it getting better, staying the same, or worsening?"  ?    (Note: Constant means the pain never goes away completely; most serious pain is constant and it progresses)  ?   - If intermittent: "How long does it last?" "Do you have pain now?" ?    (Note: Intermittent means the pain goes away completely between bouts) ?    Constant- can ease- but never stops ?6. SEVERITY: "How bad is the pain?"  (e.g., Scale 1-10; mild, moderate, or severe) ?  - MILD (1-3): doesn't interfere with normal activities, abdomen soft and not tender to touch  ?  - MODERATE (4-7): interferes with normal activities or awakens from sleep, abdomen tender to touch  ?  - SEVERE (8-10): excruciating pain, doubled over, unable to do any normal activities  ?    severe ?7. RECURRENT SYMPTOM: "Have you ever had this type of stomach pain before?" If Yes, ask: "When was the last time?" and "What happened that time?"  ?    no ?8. CAUSE: "What do you think is causing the stomach pain?" ?    unsure ?9. RELIEVING/AGGRAVATING FACTORS: "What makes it better or worse?" (e.g., movement, antacids, bowel movement) ?    no ?10. OTHER SYMPTOMS: "Do you have any other symptoms?" (e.g., back pain, diarrhea, fever, urination pain, vomiting) ?      Not eating- liquids only ?11. PREGNANCY: "Is there any chance you are pregnant?" "When was your last menstrual period?" ?      *No Answer* ? ?Protocols used: Abdominal Pain - Female-A-AH ? ?

## 2021-08-17 NOTE — Telephone Encounter (Signed)
?  Chief Complaint: severe abdominal pain ?Symptoms: pain, nausea ?Frequency: started Saturday- not better ?Pertinent Negatives: Patient denies back pain, diarrhea, fever, urination pain, vomiting ?Disposition: '[x]'$ ED /'[]'$ Urgent Care (no appt availability in office) / '[]'$ Appointment(In office/virtual)/ '[]'$  [place] [facility]/ '[]'$ Home Care/ '[]'$ Refused Recommended Disposition /'[]'$ [place] [facility]/ '[]'$  Follow-up with PCP ?Additional Notes: Advised ED- patient does not want to go  ?

## 2021-08-18 DIAGNOSIS — D72829 Elevated white blood cell count, unspecified: Secondary | ICD-10-CM | POA: Diagnosis not present

## 2021-08-18 DIAGNOSIS — E119 Type 2 diabetes mellitus without complications: Secondary | ICD-10-CM | POA: Diagnosis not present

## 2021-08-18 DIAGNOSIS — E872 Acidosis, unspecified: Secondary | ICD-10-CM | POA: Diagnosis not present

## 2021-08-18 DIAGNOSIS — R109 Unspecified abdominal pain: Secondary | ICD-10-CM | POA: Diagnosis not present

## 2021-08-20 ENCOUNTER — Encounter: Payer: Self-pay | Admitting: Physician Assistant

## 2021-08-21 ENCOUNTER — Other Ambulatory Visit: Payer: Self-pay | Admitting: Physician Assistant

## 2021-08-21 DIAGNOSIS — R109 Unspecified abdominal pain: Secondary | ICD-10-CM

## 2021-08-22 ENCOUNTER — Other Ambulatory Visit: Payer: Self-pay | Admitting: Physician Assistant

## 2021-08-25 ENCOUNTER — Ambulatory Visit: Payer: BC Managed Care – PPO | Admitting: Physician Assistant

## 2021-08-25 MED ORDER — METOPROLOL SUCCINATE ER 50 MG PO TB24: 50.0000 mg | ORAL_TABLET | Freq: Every day | ORAL | 0 refills | Status: DC

## 2021-08-31 DIAGNOSIS — R109 Unspecified abdominal pain: Secondary | ICD-10-CM | POA: Diagnosis not present

## 2021-09-09 ENCOUNTER — Other Ambulatory Visit: Payer: Self-pay | Admitting: Physician Assistant

## 2021-09-09 ENCOUNTER — Ambulatory Visit: Payer: BC Managed Care – PPO | Admitting: Physician Assistant

## 2021-09-09 DIAGNOSIS — E119 Type 2 diabetes mellitus without complications: Secondary | ICD-10-CM

## 2021-09-09 MED ORDER — DEXCOM G6 SENSOR MISC: 2 refills | Status: DC

## 2021-09-09 NOTE — Progress Notes (Deleted)
?I,Sha'taria Cordarro Spinnato,acting as a Education administrator for [employer], PA-C.,have documented all relevant documentation on the behalf of [name], PA-C,as directed by  [name], PA-C while in the presence of [name], PA-C. ? ?Established Patient Office Visit ? ?Subjective:  ?Patient ID: Lori [name], female    DOB: [birth year]  Age: 46 y.o. MRN: [id number] ? ?CC: No chief complaint on file. ? ? ?HPI ?Lori [name] presents for 6-8 week anxiety follow-up. ?Anxiety, Follow-up ? ?She was last seen for anxiety 9 weeks ago. ?Changes made at last visit include discussed increasing sertraline from 100 mg daily to 150 mg daily and looking into a program at work. ?  ?She reports {excellent/good/fair/poor:19665} compliance with treatment. ?She reports {good/fair/poor:18685} tolerance of treatment. ?She {is/is not:21021397} having side effects. {document side effects if present:1} ? ?She feels her anxiety is {Desc; severity:60313} and {improved/worse/unchanged:3041574} since last visit. ? ?Symptoms: ?{Yes/No:20286} chest pain {Yes/No:20286} difficulty concentrating  ?{Yes/No:20286} dizziness {Yes/No:20286} fatigue  ?{Yes/No:20286} feelings of losing control {Yes/No:20286} insomnia  ?{Yes/No:20286} irritable {Yes/No:20286} palpitations  ?{Yes/No:20286} panic attacks {Yes/No:20286} racing thoughts  ?{Yes/No:20286} shortness of breath {Yes/No:20286} sweating  ?{Yes/No:20286} tremors/shakes   ? ?GAD-7 Results ? ?  07/06/2021  ?  1:28 PM  ?GAD-7 Generalized Anxiety Disorder Screening Tool  ?1. Feeling Nervous, Anxious, or on Edge 2  ?2. Not Being Able to Stop or Control Worrying 2  ?3. Worrying Too Much About Different Things 3  ?4. Trouble Relaxing 2  ?5. Being So Restless it's Hard To Sit Still 2  ?6. Becoming Easily Annoyed or Irritable 2  ?7. Feeling Afraid As If Something Awful Might Happen 2  ?Total GAD-7 Score 15  ?Difficulty At Work, Home, or Getting  Along With Others? Somewhat difficult  ? ? ?PHQ-9 Scores ? ?   07/06/2021  ?  1:12 PM 08/25/2020  ?  2:07 PM 08/04/2020  ? 11:21 AM  ?PHQ9 SCORE ONLY  ?PHQ-9 Total Score 6 0 8  ? ? ?---------------------------------------------------------------------------------------------------  ? ?Past Medical History:  ?Diagnosis Date  ? Bacterial vaginitis   ? CHF (congestive heart failure) ([place])   ? Colon polyps   ? Complication of anesthesia   ? nausea/vomiting  ? Depression   ? Diabetes mellitus without complication ([place])   ? History of abnormal mammogram 2013  ? '@[facility]'$ - NEG MRI AND BX OCT 2014 NORMAL  ? History of mammogram 03/2015  ? BREAST MRI AT [facility] WNL  ? History of Papanicolaou smear of cervix 10/08/2013; 08/04/15  ? -/-; ASCUS/HPV NEG;  ? Hodgkin's disease ([place]) 1987  ? She had surgical resection of Lymph nodes and chemo + rad tx's.  ? Hypertension   ? Hypothyroidism   ? 2ND TO CA TX  ? PONV (postoperative nausea and vomiting)   ? Restrictive lung disease   ? Vitamin D deficiency   ? ? ?Past Surgical History:  ?Procedure Laterality Date  ? CHOLECYSTECTOMY    ? DEEP NECK LYMPH NODE BIOPSY / EXCISION  1987  ? hodgkin lymphoma  ? DILATATION & CURETTAGE/HYSTEROSCOPY WITH MYOSURE N/A 03/10/2017  ? Procedure: [place];  Surgeon: Will Bonnet, MD;  Location: [facility] ORS;  Service: Gynecology;  Laterality: N/A;  ? INTRAUTERINE DEVICE (IUD) INSERTION  [id number]; 02/06/09; 12/12/13  ? INTRAUTERINE DEVICE (IUD) INSERTION N/A 03/10/2017  ? Procedure: INTRAUTERINE DEVICE (IUD) INSERTION;  Surgeon: Will Bonnet, MD;  Location: [facility] ORS;  Service: Gynecology;  Laterality: N/A;  ? SPLENECTOMY    ? THYROIDECTOMY    ? ? ?  Family History  ?Problem Relation Age of Onset  ? Hypertension Mother   ? Other Maternal Grandfather   ?     bile duct cancer  ? Colon cancer Paternal Grandmother   ?     65-70  ? ? ?Social History  ? ?Socioeconomic History  ? Marital status: Married  ?  Spouse name: Not on file  ? Number of children: Not on file  ? Years of education: 53  ?  Highest education level: Not on file  ?Occupational History  ? Occupation: QUOTATION SPEC  ?  Employer:  [employer]  ?Tobacco Use  ? Smoking status: Former  ?  Packs/day: 0.25  ?  Years: 10.00  ?  Pack years: 2.50  ?  Types: Cigarettes  ?  Quit date: 03/10/2016  ?  Years since quitting: 5.5  ? Smokeless tobacco: Never  ?Vaping Use  ? Vaping Use: Never used  ?Substance and Sexual Activity  ? Alcohol use: Yes  ?  Alcohol/week: 2.0 standard drinks  ?  Types: 2 Glasses of wine per week  ?  Comment: occasional  ? Drug use: No  ? Sexual activity: Yes  ?  Birth control/protection: I.U.D.  ?  Comment: Mirena  ?Other Topics Concern  ? Not on file  ?Social History Narrative  ? Not on file  ? ?Social Determinants of Health  ? ?Financial Resource Strain: Not on file  ?Food Insecurity: Not on file  ?Transportation Needs: Not on file  ?Physical Activity: Not on file  ?Stress: Not on file  ?Social Connections: Not on file  ?Intimate Partner Violence: Not on file  ? ? ?Outpatient Medications Prior to Visit  ?Medication Sig Dispense Refill  ? ALPRAZolam (XANAX) 0.5 MG tablet TAKE 1 TABLET BY MOUTH AT BEDTIME AS NEEDED FOR ANXIETY AND 1/2 TAB DURING THE DAY FOR PANIC ATTACKS. Do not take with other muscle relaxants 45 tablet 0  ? atorvastatin (LIPITOR) 20 MG tablet Take 20 mg by mouth at bedtime.    ? BD PEN NEEDLE NANO U/F 32G X 4 MM MISC Use with Insulin injctions 100 each 10  ? Continuous Blood Gluc Sensor (DEXCOM G6 SENSOR) MISC SMARTSIG:1 Each Topical Every 10 Days    ? Continuous Blood Gluc Transmit (DEXCOM G6 TRANSMITTER) MISC USE TO MONITOR BLOOD SUGARS. CHANGE EVERY 90 DAYS.    ? cyclobenzaprine (FLEXERIL) 5 MG tablet Take 1 tablet (5 mg total) by mouth 3 (three) times daily as needed for muscle spasms. Do not take with other muscle relaxants or sedatives 90 tablet 0  ? empagliflozin (JARDIANCE) 25 MG TABS tablet Take by mouth.    ? glimepiride (AMARYL) 2 MG tablet Take 2 mg by mouth every morning.    ? hydrOXYzine  (VISTARIL) 25 MG capsule Take 1 capsule (25 mg total) by mouth at bedtime as needed. Take 1-2 capsules at bedtime as needed 90 capsule 0  ? levonorgestrel (MIRENA) 20 MCG/24HR IUD 1 each by Intrauterine route once.    ? levothyroxine (SYNTHROID) 88 MCG tablet Take 88 mcg by mouth daily.    ? lisinopril (PRINIVIL,ZESTRIL) 5 MG tablet Take 5 mg by mouth at bedtime.     ? metFORMIN (GLUCOPHAGE-XR) 500 MG 24 hr tablet Take 1,000 mg by mouth 2 (two) times daily.    ? metoprolol succinate (TOPROL-XL) 50 MG 24 hr tablet Take 1 tablet (50 mg total) by mouth daily. 90 tablet 0  ? pantoprazole (PROTONIX) 40 MG tablet TAKE 1 TABLET BY MOUTH EVERY DAY  90 tablet 3  ? sertraline (ZOLOFT) 100 MG tablet Take 1 tablet (100 mg total) by mouth daily. 180 tablet 1  ? sertraline (ZOLOFT) 50 MG tablet Take 1 tablet (50 mg total) by mouth daily. Take in addition to 100 mg 90 tablet 1  ? ?No facility-administered medications prior to visit.  ? ? ?Allergies  ?Allergen Reactions  ? Dapagliflozin Other (See Comments)  ?  [place]) UTI  ? Liraglutide Nausea Only  ?  Victoza  ? ? ?ROS ?Review of Systems ? ?  ?Objective:  ?  ?Physical Exam ? ?There were no vitals taken for this visit. ?Wt Readings from Last 3 Encounters:  ?07/17/21 145 lb 12.8 oz (66.1 kg)  ?07/06/21 146 lb 8 oz (66.5 kg)  ?10/27/20 151 lb (68.5 kg)  ? ? ? ?Health Maintenance Due  ?Topic Date Due  ? COVID-19 Vaccine (1) Never done  ? FOOT EXAM  Never done  ? HIV Screening  Never done  ? Hepatitis C Screening  Never done  ? TETANUS/TDAP  Never done  ? COLONOSCOPY (Pts 45-80yr Insurance coverage will need to be confirmed)  10/02/2019  ? OPHTHALMOLOGY EXAM  10/27/2019  ? ? ?There are no preventive care reminders to display for this patient. ? ?Lab Results  ?Component Value Date  ? TSH 0.09 (A) 03/15/2013  ? ?Lab Results  ?Component Value Date  ? WBC 15.8 (H) 03/02/2017  ? HGB 12.5 03/02/2017  ? HCT 38.5 03/02/2017  ? MCV 86.3 03/02/2017  ? PLT 460 (H) 03/02/2017  ? ?Lab Results   ?Component Value Date  ? NA 137 03/02/2017  ? K 4.1 03/02/2017  ? CO2 27 03/02/2017  ? GLUCOSE 107 (H) 03/02/2017  ? BUN 13 03/02/2017  ? CREATININE 0.55 03/02/2017  ? BILITOT 0.5 03/02/2017  ? ALKPHOS 62 09/26

## 2021-09-16 ENCOUNTER — Other Ambulatory Visit: Payer: Self-pay | Admitting: Physician Assistant

## 2021-09-16 DIAGNOSIS — M62830 Muscle spasm of back: Secondary | ICD-10-CM

## 2021-10-01 DIAGNOSIS — K5909 Other constipation: Secondary | ICD-10-CM | POA: Insufficient documentation

## 2021-10-01 DIAGNOSIS — F1990 Other psychoactive substance use, unspecified, uncomplicated: Secondary | ICD-10-CM | POA: Insufficient documentation

## 2021-10-01 DIAGNOSIS — D126 Benign neoplasm of colon, unspecified: Secondary | ICD-10-CM | POA: Insufficient documentation

## 2021-10-01 DIAGNOSIS — C8110 Nodular sclerosis classical Hodgkin lymphoma, unspecified site: Secondary | ICD-10-CM | POA: Diagnosis not present

## 2021-10-01 DIAGNOSIS — K219 Gastro-esophageal reflux disease without esophagitis: Secondary | ICD-10-CM | POA: Diagnosis not present

## 2021-10-01 DIAGNOSIS — I5022 Chronic systolic (congestive) heart failure: Secondary | ICD-10-CM | POA: Diagnosis not present

## 2021-10-01 DIAGNOSIS — I427 Cardiomyopathy due to drug and external agent: Secondary | ICD-10-CM | POA: Diagnosis not present

## 2021-10-05 ENCOUNTER — Other Ambulatory Visit: Payer: Self-pay | Admitting: Physician Assistant

## 2021-10-05 DIAGNOSIS — G43719 Chronic migraine without aura, intractable, without status migrainosus: Secondary | ICD-10-CM | POA: Diagnosis not present

## 2021-10-05 DIAGNOSIS — F411 Generalized anxiety disorder: Secondary | ICD-10-CM

## 2021-10-09 ENCOUNTER — Other Ambulatory Visit: Payer: Self-pay | Admitting: Physician Assistant

## 2021-10-09 DIAGNOSIS — G4700 Insomnia, unspecified: Secondary | ICD-10-CM

## 2021-10-09 NOTE — Telephone Encounter (Signed)
Requested medications are due for refill today.  yes ? ?Requested medications are on the active medications list.  yes ? ?Last refill. 07/06/2021 #90 0 refills ? ?Future visit scheduled.   no ? ?Notes to clinic.  Medication refill failed protocol d/t expired labs. ? ? ? ?Requested Prescriptions  ?Pending Prescriptions Disp Refills  ? hydrOXYzine (VISTARIL) 25 MG capsule [Pharmacy Med Name: HYDROXYZINE PAM 25 MG CAP] 90 capsule 0  ?  Sig: TAKE 1 CAPSULE (25 MG TOTAL) BY MOUTH AT BEDTIME AS NEEDED. TAKE 1-2 CAPSULES AT BEDTIME AS NEEDED  ?  ? Ear, Nose, and Throat:  Antihistamines 2 Failed - 10/09/2021  2:57 AM  ?  ?  Failed - Cr in normal range and within 360 days  ?  Creatinine  ?Date Value Ref Range Status  ?04/14/2014 0.61 0.60 - 1.30 mg/dL Final  ? ?Creatinine, Ser  ?Date Value Ref Range Status  ?03/02/2017 0.55 0.44 - 1.00 mg/dL Final  ?  ?  ?  ?  Passed - Valid encounter within last 12 months  ?  Recent Outpatient Visits   ? ?      ? 2 months ago Ischial pain, unspecified laterality  ? [facility] Birdie Sons, MD  ? 3 months ago GAD (generalized anxiety disorder)  ? [facility] Thedore Mins, Ria Comment, PA-C  ? 1 year ago Hamstring strain, right, initial encounter  ? [place], [place]  ? 1 year ago Chronic right-sided low back pain with right-sided sciatica  ? [place], [place]  ? 1 year ago Upper respiratory tract infection, unspecified type  ? [place], FNP  ? ?  ?  ?Future Appointments   ? ?        ? In 3 months Brendolyn Patty, MD [place]  ? ?  ? ? ?  ?  ?  ?  ?

## 2021-10-12 ENCOUNTER — Ambulatory Visit (INDEPENDENT_AMBULATORY_CARE_PROVIDER_SITE_OTHER): Payer: BC Managed Care – PPO | Admitting: Physician Assistant

## 2021-10-12 ENCOUNTER — Encounter: Payer: Self-pay | Admitting: Physician Assistant

## 2021-10-12 VITALS — BP 100/67 | HR 91 | Resp 16 | Wt 145.9 lb

## 2021-10-12 DIAGNOSIS — F411 Generalized anxiety disorder: Secondary | ICD-10-CM

## 2021-10-12 DIAGNOSIS — F320 Major depressive disorder, single episode, mild: Secondary | ICD-10-CM | POA: Diagnosis not present

## 2021-10-12 DIAGNOSIS — R61 Generalized hyperhidrosis: Secondary | ICD-10-CM

## 2021-10-12 NOTE — Assessment & Plan Note (Signed)
No benefit from sertraline 150 mg, advised we go back down to 100 mg.  ?Discussed therapy again ?

## 2021-10-12 NOTE — Progress Notes (Signed)
?  ? ? ?Established patient visit ? ? ?Patient: Lori [name]   DOB: 07-Sep-1975   46 y.o. Female  MRN: [id number] ?Visit Date: 10/12/2021 ? ?Today's healthcare provider: [name], PA-C  ? ?I,Kathleen J Wolford,acting as a Education administrator for [employer], PA-C.,have documented all relevant documentation on the behalf of [name], PA-C,as directed by  [name], PA-C while in the presence of [name], PA-C.  ?Chief Complaint  ?Patient presents with  ? Anxiety  ? ?Subjective  ?  ?HPI  ?Anxiety, Follow-up ? ?Pt feels she may be pre-menopausal ?-hot sweats out of no where ?-sweats at night ?-irritable; easily agitated  ? ?She was last seen for anxiety 4 months ago. ?Changes made at last visit include Discussed increasing sertraline from 100 mg daily to 150 mg daily.. Patient states that she d/c Hydroxyzine due to poor symptom control. ? ?Last fill of xanax #30 was 10/06/21, before that 07/14/2021. ?  ?She reports excellent compliance with treatment. ?She reports good tolerance of treatment. ?She is not having side effects. ? ?She feels her anxiety is moderate and Worse since last visit. ? ?Symptoms: ?No chest pain No difficulty concentrating  ?No dizziness No fatigue  ?No feelings of losing control Yes insomnia  ?Yes irritable No palpitations  ?No panic attacks No racing thoughts  ?No shortness of breath Yes sweating  ?No tremors/shakes   ? ?GAD-7 Results ? ?  10/12/2021  ?  4:18 PM 07/06/2021  ?  1:28 PM  ?GAD-7 Generalized Anxiety Disorder Screening Tool  ?1. Feeling Nervous, Anxious, or on Edge 1 2  ?2. Not Being Able to Stop or Control Worrying 1 2  ?3. Worrying Too Much About Different Things  3  ?4. Trouble Relaxing 3 2  ?5. Being So Restless it's Hard To Sit Still  2  ?6. Becoming Easily Annoyed or Irritable 3 2  ?7. Feeling Afraid As If Something Awful Might Happen 1 2  ?Total GAD-7 Score  15  ?Difficulty At Work, Home, or Getting  Along With Others? Somewhat difficult Somewhat difficult  ? ? ?PHQ-9  Scores ? ?  07/06/2021  ?  1:12 PM 08/25/2020  ?  2:07 PM 08/04/2020  ? 11:21 AM  ?PHQ9 SCORE ONLY  ?PHQ-9 Total Score 6 0 8  ? ?---------------------------------------------------------------------------------------------------  ? ?Medications: ?Outpatient Medications Prior to Visit  ?Medication Sig  ? sitaGLIPtin (JANUVIA) 100 MG tablet Take 100 mg by mouth daily.  ? ALPRAZolam (XANAX) 0.5 MG tablet TAKE 1 TABLET BY MOUTH AT BEDTIME AS NEEDED FOR ANXIETY AND 1/2 TAB DURING THE DAY FOR PANIC ATTACKS. DO NOT TAKE WITH OTHER MUSCLE RELAXANTS  ? atorvastatin (LIPITOR) 20 MG tablet Take 20 mg by mouth at bedtime.  ? BD PEN NEEDLE NANO U/F 32G X 4 MM MISC Use with Insulin injctions  ? Continuous Blood Gluc Sensor (DEXCOM G6 SENSOR) MISC SMARTSIG:1 Each Topical Every 10 Days  ? Continuous Blood Gluc Transmit (DEXCOM G6 TRANSMITTER) MISC USE TO MONITOR BLOOD SUGARS. CHANGE EVERY 90 DAYS.  ? cyclobenzaprine (FLEXERIL) 5 MG tablet TAKE 1 TABLET BY MOUTH 3 (THREE) TIMES DAILY AS NEEDED FOR MUSCLE SPASMS. DO NOT TAKE WITH OTHER MUSCLE RELAXANTS OR SEDATIVES  ? empagliflozin (JARDIANCE) 25 MG TABS tablet Take by mouth.  ? glimepiride (AMARYL) 2 MG tablet Take 2 mg by mouth every morning.  ? levonorgestrel (MIRENA) 20 MCG/24HR IUD 1 each by Intrauterine route once.  ? levothyroxine (SYNTHROID) 88 MCG tablet Take 88 mcg by mouth daily.  ?  lisinopril (PRINIVIL,ZESTRIL) 5 MG tablet Take 5 mg by mouth at bedtime.   ? metFORMIN (GLUCOPHAGE-XR) 500 MG 24 hr tablet Take 1,000 mg by mouth 2 (two) times daily.  ? metoprolol succinate (TOPROL-XL) 50 MG 24 hr tablet Take 1 tablet (50 mg total) by mouth daily.  ? pantoprazole (PROTONIX) 40 MG tablet TAKE 1 TABLET BY MOUTH EVERY DAY  ? sertraline (ZOLOFT) 100 MG tablet Take 1 tablet (100 mg total) by mouth daily.  ? [DISCONTINUED] hydrOXYzine (VISTARIL) 25 MG capsule TAKE 1 CAPSULE (25 MG TOTAL) BY MOUTH AT BEDTIME AS NEEDED. TAKE 1-2 CAPSULES AT BEDTIME AS NEEDED  ? [DISCONTINUED] sertraline  (ZOLOFT) 50 MG tablet Take 1 tablet (50 mg total) by mouth daily. Take in addition to 100 mg  ? ?No facility-administered medications prior to visit.  ? ? ?Review of Systems  ?Constitutional:  Negative for fatigue and fever.  ?Respiratory:  Negative for cough and shortness of breath.   ?Cardiovascular:  Negative for chest pain and leg swelling.  ?Gastrointestinal:  Negative for abdominal pain.  ?Neurological:  Negative for dizziness and headaches.  ?Psychiatric/Behavioral:  The patient is nervous/anxious.   ? ? ?  Objective  ?  ?BP 100/67   Pulse 91   Resp 16   Wt 145 lb 14.4 oz (66.2 kg)   SpO2 97%   BMI 23.55 kg/m?  ? ? ?Physical Exam ?Vitals reviewed.  ?Constitutional:   ?   Appearance: She is not ill-appearing.  ?HENT:  ?   Head: Normocephalic.  ?Eyes:  ?   Conjunctiva/sclera: Conjunctivae normal.  ?Cardiovascular:  ?   Rate and Rhythm: Normal rate.  ?Pulmonary:  ?   Effort: Pulmonary effort is normal. No respiratory distress.  ?Neurological:  ?   General: No focal deficit present.  ?   Mental Status: She is alert and oriented to person, place, and time.  ?Psychiatric:     ?   Mood and Affect: Mood normal.     ?   Behavior: Behavior normal.  ?  ? ?No results found for any visits on 10/12/21. ? Assessment & Plan  ?  ? ?Problem List Items Addressed This Visit   ? ?  ? Other  ? GAD (generalized anxiety disorder) - Primary  ?  No benefit from sertraline 150 mg, advised we go back down to 100 mg.  ?Discussed therapy again ? ?  ?  ? Relevant Orders  ? CBC w/Diff/Platelet  ? TSH + free T4  ? Comprehensive Metabolic Panel (CMET)  ? Vitamin D (25 hydroxy)  ? Night sweats  ?  Does sounds like peri-menopause, ?Will check labs w/ hx of hodgkin's lymphoma ? ? ?  ?  ? Relevant Orders  ? CBC w/Diff/Platelet  ? TSH + free T4  ? Comprehensive Metabolic Panel (CMET)  ? Depression, major, single episode, mild ([place])  ?   ?Discussed potentially adding wellbutrin 150 mg, but currently takes metoprolol and they interact. Advised  she discuss w/ cardio.  ?Other options are switching from SSRI to SNRI or buspar.  ? ?  ?  ? Relevant Orders  ? TSH + free T4  ? Vitamin D (25 hydroxy)  ?  ? ?Return in about 2 months (around 12/12/2021) for anxiety, depression, chronic conditions.  ?   ? ?I, [name], PA-C have reviewed all documentation for this visit. The documentation on  10/12/2021  for the exam, diagnosis, procedures, and orders are all accurate and complete. ? ?[name], PA-C ?[facility] ?[address] ?[place], [place], [address] ?Office: [phone] ?Fax: [phone]  ? ?[facility] ?

## 2021-10-12 NOTE — Assessment & Plan Note (Signed)
Does sounds like peri-menopause, ?Will check labs w/ hx of hodgkin's lymphoma ? ?

## 2021-10-12 NOTE — Assessment & Plan Note (Addendum)
?  Discussed potentially adding wellbutrin 150 mg, but currently takes metoprolol and they interact. Advised she discuss w/ cardio.  ?Other options are switching from SSRI to SNRI or buspar.  ?

## 2021-10-14 ENCOUNTER — Encounter: Payer: Self-pay | Admitting: Physician Assistant

## 2021-10-14 DIAGNOSIS — F411 Generalized anxiety disorder: Secondary | ICD-10-CM | POA: Diagnosis not present

## 2021-10-14 DIAGNOSIS — R61 Generalized hyperhidrosis: Secondary | ICD-10-CM | POA: Diagnosis not present

## 2021-10-14 DIAGNOSIS — F320 Major depressive disorder, single episode, mild: Secondary | ICD-10-CM | POA: Diagnosis not present

## 2021-10-15 ENCOUNTER — Encounter: Payer: Self-pay | Admitting: Physician Assistant

## 2021-10-15 DIAGNOSIS — R300 Dysuria: Secondary | ICD-10-CM | POA: Diagnosis not present

## 2021-10-15 DIAGNOSIS — E1159 Type 2 diabetes mellitus with other circulatory complications: Secondary | ICD-10-CM | POA: Diagnosis not present

## 2021-10-15 DIAGNOSIS — E039 Hypothyroidism, unspecified: Secondary | ICD-10-CM | POA: Diagnosis not present

## 2021-10-15 DIAGNOSIS — Z794 Long term (current) use of insulin: Secondary | ICD-10-CM | POA: Diagnosis not present

## 2021-10-15 DIAGNOSIS — E1165 Type 2 diabetes mellitus with hyperglycemia: Secondary | ICD-10-CM | POA: Diagnosis not present

## 2021-10-15 LAB — CBC WITH DIFFERENTIAL/PLATELET
Basophils Absolute: 0.2 10*3/uL (ref 0.0–0.2)
Basos: 1 %
EOS (ABSOLUTE): 0.1 10*3/uL (ref 0.0–0.4)
Eos: 1 %
Hematocrit: 39.3 % (ref 34.0–46.6)
Hemoglobin: 12.8 g/dL (ref 11.1–15.9)
Immature Grans (Abs): 0.1 10*3/uL (ref 0.0–0.1)
Immature Granulocytes: 1 %
Lymphocytes Absolute: 5.3 10*3/uL — ABNORMAL HIGH (ref 0.7–3.1)
Lymphs: 45 %
MCH: 27.4 pg (ref 26.6–33.0)
MCHC: 32.6 g/dL (ref 31.5–35.7)
MCV: 84 fL (ref 79–97)
Monocytes Absolute: 0.9 10*3/uL (ref 0.1–0.9)
Monocytes: 8 %
Neutrophils Absolute: 5.2 10*3/uL (ref 1.4–7.0)
Neutrophils: 44 %
Platelets: 578 10*3/uL — ABNORMAL HIGH (ref 150–450)
RBC: 4.68 x10E6/uL (ref 3.77–5.28)
RDW: 15.1 % (ref 11.7–15.4)
WBC: 11.8 10*3/uL — ABNORMAL HIGH (ref 3.4–10.8)

## 2021-10-15 LAB — COMPREHENSIVE METABOLIC PANEL
ALT: 20 IU/L (ref 0–32)
AST: 17 IU/L (ref 0–40)
Albumin/Globulin Ratio: 1.7 (ref 1.2–2.2)
Albumin: 4.7 g/dL (ref 3.8–4.8)
Alkaline Phosphatase: 68 IU/L (ref 44–121)
BUN/Creatinine Ratio: 20 (ref 9–23)
BUN: 14 mg/dL (ref 6–24)
Bilirubin Total: 0.3 mg/dL (ref 0.0–1.2)
CO2: 21 mmol/L (ref 20–29)
Calcium: 9.9 mg/dL (ref 8.7–10.2)
Chloride: 101 mmol/L (ref 96–106)
Creatinine, Ser: 0.71 mg/dL (ref 0.57–1.00)
Globulin, Total: 2.7 g/dL (ref 1.5–4.5)
Glucose: 97 mg/dL (ref 70–99)
Potassium: 4.7 mmol/L (ref 3.5–5.2)
Sodium: 138 mmol/L (ref 134–144)
Total Protein: 7.4 g/dL (ref 6.0–8.5)
eGFR: 106 mL/min/{1.73_m2} (ref >59)

## 2021-10-15 LAB — TSH+FREE T4
Free T4: 1.67 ng/dL (ref 0.82–1.77)
TSH: 0.215 u[IU]/mL — ABNORMAL LOW (ref 0.450–4.500)

## 2021-10-15 LAB — VITAMIN D 25 HYDROXY (VIT D DEFICIENCY, FRACTURES): Vit D, 25-Hydroxy: 32.7 ng/mL (ref 30.0–100.0)

## 2021-10-19 ENCOUNTER — Other Ambulatory Visit: Payer: Self-pay | Admitting: Physician Assistant

## 2021-10-19 DIAGNOSIS — Z8571 Personal history of Hodgkin lymphoma: Secondary | ICD-10-CM

## 2021-10-19 DIAGNOSIS — R61 Generalized hyperhidrosis: Secondary | ICD-10-CM

## 2021-10-20 ENCOUNTER — Other Ambulatory Visit: Payer: Self-pay | Admitting: Physician Assistant

## 2021-10-20 DIAGNOSIS — F320 Major depressive disorder, single episode, mild: Secondary | ICD-10-CM

## 2021-10-20 MED ORDER — SERTRALINE HCL 25 MG PO TABS: ORAL_TABLET | ORAL | 1 refills | Status: DC

## 2021-10-20 MED ORDER — VENLAFAXINE HCL ER 37.5 MG PO CP24: 37.5000 mg | ORAL_CAPSULE | Freq: Every day | ORAL | 1 refills | Status: DC

## 2021-10-26 DIAGNOSIS — K2970 Gastritis, unspecified, without bleeding: Secondary | ICD-10-CM | POA: Diagnosis not present

## 2021-10-26 DIAGNOSIS — K219 Gastro-esophageal reflux disease without esophagitis: Secondary | ICD-10-CM | POA: Diagnosis not present

## 2021-10-26 DIAGNOSIS — K6389 Other specified diseases of intestine: Secondary | ICD-10-CM | POA: Diagnosis not present

## 2021-10-26 DIAGNOSIS — Z86010 Personal history of colonic polyps: Secondary | ICD-10-CM | POA: Diagnosis not present

## 2021-10-26 DIAGNOSIS — D122 Benign neoplasm of ascending colon: Secondary | ICD-10-CM | POA: Diagnosis not present

## 2021-10-26 DIAGNOSIS — R1013 Epigastric pain: Secondary | ICD-10-CM | POA: Diagnosis not present

## 2021-10-26 DIAGNOSIS — Z1211 Encounter for screening for malignant neoplasm of colon: Secondary | ICD-10-CM | POA: Diagnosis not present

## 2021-10-26 DIAGNOSIS — K640 First degree hemorrhoids: Secondary | ICD-10-CM | POA: Diagnosis not present

## 2021-10-26 DIAGNOSIS — D123 Benign neoplasm of transverse colon: Secondary | ICD-10-CM | POA: Diagnosis not present

## 2021-10-26 LAB — HM COLONOSCOPY

## 2021-11-04 DIAGNOSIS — M76891 Other specified enthesopathies of right lower limb, excluding foot: Secondary | ICD-10-CM | POA: Diagnosis not present

## 2021-11-04 DIAGNOSIS — R300 Dysuria: Secondary | ICD-10-CM | POA: Diagnosis not present

## 2021-11-06 ENCOUNTER — Other Ambulatory Visit: Payer: Self-pay | Admitting: Orthopedic Surgery

## 2021-11-06 DIAGNOSIS — M76891 Other specified enthesopathies of right lower limb, excluding foot: Secondary | ICD-10-CM

## 2021-11-11 ENCOUNTER — Ambulatory Visit: Payer: BC Managed Care – PPO | Admitting: Physician Assistant

## 2021-11-12 ENCOUNTER — Ambulatory Visit: Payer: BC Managed Care – PPO | Admitting: Physician Assistant

## 2021-11-12 ENCOUNTER — Encounter: Payer: Self-pay | Admitting: Physician Assistant

## 2021-11-12 ENCOUNTER — Other Ambulatory Visit: Payer: Self-pay | Admitting: Physician Assistant

## 2021-11-12 VITALS — BP 89/66 | HR 88 | Temp 98.1°F | Wt 143.0 lb

## 2021-11-12 DIAGNOSIS — F411 Generalized anxiety disorder: Secondary | ICD-10-CM

## 2021-11-12 DIAGNOSIS — R3915 Urgency of urination: Secondary | ICD-10-CM

## 2021-11-12 DIAGNOSIS — M62830 Muscle spasm of back: Secondary | ICD-10-CM

## 2021-11-12 DIAGNOSIS — R61 Generalized hyperhidrosis: Secondary | ICD-10-CM | POA: Diagnosis not present

## 2021-11-12 DIAGNOSIS — Z8571 Personal history of Hodgkin lymphoma: Secondary | ICD-10-CM | POA: Diagnosis not present

## 2021-11-12 LAB — POCT URINALYSIS DIPSTICK
Bilirubin, UA: NEGATIVE
Blood, UA: NEGATIVE
Glucose, UA: POSITIVE — AB
Ketones, UA: NEGATIVE
Leukocytes, UA: NEGATIVE
Nitrite, UA: NEGATIVE
Protein, UA: NEGATIVE
Spec Grav, UA: <=1.005 — AB (ref 1.010–1.025)
Urobilinogen, UA: 0.2 E.U./dL
pH, UA: 5 (ref 5.0–8.0)

## 2021-11-12 NOTE — Progress Notes (Signed)
Lori [name],acting as a scribe for Lori Kirschner, PA-C.,have documented all relevant documentation on the behalf of Lori Kirschner, PA-C,as directed by  Lori Kirschner, PA-C while in the presence of Lori Kirschner, PA-C.     Established patient visit   Patient: Lori [name]   DOB: 08/03/1975   46 y.o. Female  MRN: [id number] Visit Date: 11/12/2021  Today's healthcare provider: [name], PA-C   Cc. Urinary symptoms  Subjective    HPI  [name] is a 46 y/o female who presents today for lower abdominal pressure, urinary frequency, urinary urgency. She reports increasing fluids and using uristat. Denies dysuria, hematuria, fevers, chills, low back pain,.   Medications: Outpatient Medications Prior to Visit  Medication Sig   ALPRAZolam (XANAX) 0.5 MG tablet TAKE 1 TABLET BY MOUTH AT BEDTIME AS NEEDED FOR ANXIETY AND 1/2 TAB DURING THE DAY FOR PANIC ATTACKS. DO NOT TAKE WITH OTHER MUSCLE RELAXANTS   atorvastatin (LIPITOR) 20 MG tablet Take 20 mg by mouth at bedtime.   BD PEN NEEDLE NANO U/F 32G X 4 MM MISC Use with Insulin injctions   Continuous Blood Gluc Sensor (DEXCOM G6 SENSOR) MISC SMARTSIG:1 Each Topical Every 10 Days   Continuous Blood Gluc Transmit (DEXCOM G6 TRANSMITTER) MISC USE TO MONITOR BLOOD SUGARS. CHANGE EVERY 90 DAYS.   cyclobenzaprine (FLEXERIL) 5 MG tablet TAKE 1 TABLET BY MOUTH 3 (THREE) TIMES DAILY AS NEEDED FOR MUSCLE SPASMS. DO NOT TAKE WITH OTHER MUSCLE RELAXANTS OR SEDATIVES   empagliflozin (JARDIANCE) 25 MG TABS tablet Take by mouth.   glimepiride (AMARYL) 2 MG tablet Take 2 mg by mouth every morning.   levonorgestrel (MIRENA) 20 MCG/24HR IUD 1 each by Intrauterine route once.   levothyroxine (SYNTHROID) 88 MCG tablet Take 88 mcg by mouth daily.   lisinopril (PRINIVIL,ZESTRIL) 5 MG tablet Take 5 mg by mouth at bedtime.    metFORMIN (GLUCOPHAGE-XR) 500 MG 24 hr tablet Take 1,000 mg by mouth 2 (two) times daily.   metoprolol succinate (TOPROL-XL) 50 MG  24 hr tablet Take 1 tablet (50 mg total) by mouth daily.   pantoprazole (PROTONIX) 40 MG tablet TAKE 1 TABLET BY MOUTH EVERY DAY   sertraline (ZOLOFT) 100 MG tablet Take 1 tablet (100 mg total) by mouth daily.   sertraline (ZOLOFT) 25 MG tablet For titration purposes. Week 1&2, 50 mg daily. Week 3 25 mg daily. Week 4 25 mg every other day.   sitaGLIPtin (JANUVIA) 100 MG tablet Take 100 mg by mouth daily.   venlafaxine XR (EFFEXOR XR) 37.5 MG 24 hr capsule Take 1 capsule (37.5 mg total) by mouth daily with breakfast. After 1-2 weeks can increase to 75 mg (two capsules). Start after titration of sertraline is complete.   No facility-administered medications prior to visit.    Review of Systems  Genitourinary:  Positive for difficulty urinating, dysuria, frequency and urgency. Negative for enuresis, hematuria, menstrual problem and pelvic pain.     Objective    BP (!) 89/66 (BP Location: Left Arm, Patient Position: Sitting, Cuff Size: Normal)   Pulse 88   Temp 98.1 F (36.7 C) (Oral)   Wt 143 lb (64.9 kg)   SpO2 100%   BMI 23.08 kg/m   Physical Exam Vitals reviewed.  Constitutional:      Appearance: She is not ill-appearing.  HENT:     Head: Normocephalic.  Eyes:     Conjunctiva/sclera: Conjunctivae normal.  Cardiovascular:     Rate and Rhythm: Normal rate.  Pulmonary:  Effort: Pulmonary effort is normal. No respiratory distress.  Neurological:     General: No focal deficit present.     Mental Status: She is alert and oriented to person, place, and time.  Psychiatric:        Mood and Affect: Mood normal.        Behavior: Behavior normal.    No results found for any visits on 11/12/21.  Assessment & Plan     Urinary frequency UA - leuks, nitrites, blood Sent for culture will hold on off treatment until culture results are back Advised increasing fluids, cranberry juice, can continue azo   I, Lori Kirschner, PA-C have reviewed all documentation for this visit. The  documentation on  11/12/2021 for the exam, diagnosis, procedures, and orders are all accurate and complete.  Lori Kirschner, PA-C [facility] [address] [place], [place], [address] Office: [phone] Fax: [place]

## 2021-11-13 ENCOUNTER — Other Ambulatory Visit: Payer: Self-pay | Admitting: Physician Assistant

## 2021-11-13 DIAGNOSIS — F320 Major depressive disorder, single episode, mild: Secondary | ICD-10-CM

## 2021-11-13 LAB — CBC WITH DIFFERENTIAL/PLATELET
Basophils Absolute: 0.2 10*3/uL (ref 0.0–0.2)
Basos: 1 %
EOS (ABSOLUTE): 0.1 10*3/uL (ref 0.0–0.4)
Eos: 1 %
Hematocrit: 38.1 % (ref 34.0–46.6)
Hemoglobin: 12.7 g/dL (ref 11.1–15.9)
Immature Grans (Abs): 0.1 10*3/uL (ref 0.0–0.1)
Immature Granulocytes: 1 %
Lymphocytes Absolute: 4.9 10*3/uL — ABNORMAL HIGH (ref 0.7–3.1)
Lymphs: 43 %
MCH: 27.5 pg (ref 26.6–33.0)
MCHC: 33.3 g/dL (ref 31.5–35.7)
MCV: 83 fL (ref 79–97)
Monocytes Absolute: 1.1 10*3/uL — ABNORMAL HIGH (ref 0.1–0.9)
Monocytes: 9 %
Neutrophils Absolute: 5.1 10*3/uL (ref 1.4–7.0)
Neutrophils: 45 %
Platelets: 519 10*3/uL — ABNORMAL HIGH (ref 150–450)
RBC: 4.62 x10E6/uL (ref 3.77–5.28)
RDW: 15.4 % (ref 11.7–15.4)
WBC: 11.3 10*3/uL — ABNORMAL HIGH (ref 3.4–10.8)

## 2021-11-14 LAB — URINE CULTURE: Organism ID, Bacteria: NO GROWTH

## 2021-11-19 ENCOUNTER — Other Ambulatory Visit: Payer: Self-pay | Admitting: Physician Assistant

## 2021-11-19 ENCOUNTER — Ambulatory Visit
Admission: RE | Admit: 2021-11-19 | Discharge: 2021-11-19 | Disposition: A | Discharge status: Home / Self Care | Payer: BC Managed Care – PPO | Source: Ambulatory Visit | Attending: Orthopedic Surgery | Admitting: Orthopedic Surgery

## 2021-11-19 DIAGNOSIS — M76891 Other specified enthesopathies of right lower limb, excluding foot: Secondary | ICD-10-CM | POA: Diagnosis not present

## 2021-11-20 DIAGNOSIS — M25551 Pain in right hip: Secondary | ICD-10-CM | POA: Diagnosis not present

## 2021-12-07 ENCOUNTER — Other Ambulatory Visit: Payer: Self-pay | Admitting: Physician Assistant

## 2021-12-07 DIAGNOSIS — M62830 Muscle spasm of back: Secondary | ICD-10-CM

## 2021-12-08 DIAGNOSIS — E860 Dehydration: Secondary | ICD-10-CM | POA: Diagnosis not present

## 2021-12-08 DIAGNOSIS — Z20822 Contact with and (suspected) exposure to covid-19: Secondary | ICD-10-CM | POA: Diagnosis not present

## 2021-12-08 DIAGNOSIS — E872 Acidosis, unspecified: Secondary | ICD-10-CM | POA: Insufficient documentation

## 2021-12-08 DIAGNOSIS — A419 Sepsis, unspecified organism: Secondary | ICD-10-CM | POA: Diagnosis not present

## 2021-12-08 DIAGNOSIS — I5022 Chronic systolic (congestive) heart failure: Secondary | ICD-10-CM | POA: Diagnosis not present

## 2021-12-08 DIAGNOSIS — R079 Chest pain, unspecified: Secondary | ICD-10-CM | POA: Diagnosis not present

## 2021-12-08 DIAGNOSIS — R519 Headache, unspecified: Secondary | ICD-10-CM | POA: Diagnosis not present

## 2021-12-08 DIAGNOSIS — J029 Acute pharyngitis, unspecified: Secondary | ICD-10-CM | POA: Diagnosis not present

## 2021-12-08 DIAGNOSIS — T451X5A Adverse effect of antineoplastic and immunosuppressive drugs, initial encounter: Secondary | ICD-10-CM | POA: Diagnosis not present

## 2021-12-08 DIAGNOSIS — Z6825 Body mass index (BMI) 25.0-25.9, adult: Secondary | ICD-10-CM | POA: Diagnosis not present

## 2021-12-08 DIAGNOSIS — N179 Acute kidney failure, unspecified: Secondary | ICD-10-CM | POA: Diagnosis not present

## 2021-12-08 DIAGNOSIS — M545 Low back pain, unspecified: Secondary | ICD-10-CM | POA: Diagnosis not present

## 2021-12-08 DIAGNOSIS — E871 Hypo-osmolality and hyponatremia: Secondary | ICD-10-CM | POA: Diagnosis not present

## 2021-12-08 DIAGNOSIS — I959 Hypotension, unspecified: Secondary | ICD-10-CM | POA: Diagnosis not present

## 2021-12-08 DIAGNOSIS — E890 Postprocedural hypothyroidism: Secondary | ICD-10-CM | POA: Diagnosis not present

## 2021-12-08 DIAGNOSIS — G8929 Other chronic pain: Secondary | ICD-10-CM | POA: Insufficient documentation

## 2021-12-08 DIAGNOSIS — E119 Type 2 diabetes mellitus without complications: Secondary | ICD-10-CM | POA: Diagnosis not present

## 2021-12-08 DIAGNOSIS — R509 Fever, unspecified: Secondary | ICD-10-CM | POA: Diagnosis not present

## 2021-12-08 DIAGNOSIS — E785 Hyperlipidemia, unspecified: Secondary | ICD-10-CM | POA: Diagnosis not present

## 2021-12-08 DIAGNOSIS — Z7984 Long term (current) use of oral hypoglycemic drugs: Secondary | ICD-10-CM | POA: Diagnosis not present

## 2021-12-08 DIAGNOSIS — K219 Gastro-esophageal reflux disease without esophagitis: Secondary | ICD-10-CM | POA: Diagnosis not present

## 2021-12-08 DIAGNOSIS — N390 Urinary tract infection, site not specified: Secondary | ICD-10-CM | POA: Diagnosis not present

## 2021-12-08 DIAGNOSIS — G44209 Tension-type headache, unspecified, not intractable: Secondary | ICD-10-CM | POA: Diagnosis not present

## 2021-12-11 ENCOUNTER — Encounter: Payer: Self-pay | Admitting: Physician Assistant

## 2021-12-11 ENCOUNTER — Ambulatory Visit (INDEPENDENT_AMBULATORY_CARE_PROVIDER_SITE_OTHER): Payer: BC Managed Care – PPO | Admitting: Physician Assistant

## 2021-12-11 VITALS — BP 135/82 | HR 76 | Temp 98.4°F | Resp 16 | Ht 66.0 in | Wt 146.0 lb

## 2021-12-11 DIAGNOSIS — Z87448 Personal history of other diseases of urinary system: Secondary | ICD-10-CM | POA: Diagnosis not present

## 2021-12-11 DIAGNOSIS — E119 Type 2 diabetes mellitus without complications: Secondary | ICD-10-CM

## 2021-12-11 LAB — POCT URINALYSIS DIPSTICK
Bilirubin, UA: NEGATIVE
Blood, UA: NEGATIVE
Glucose, UA: POSITIVE — AB
Ketones, UA: NEGATIVE
Leukocytes, UA: NEGATIVE
Nitrite, UA: NEGATIVE
Protein, UA: POSITIVE — AB
Spec Grav, UA: 1.01 (ref 1.010–1.025)
Urobilinogen, UA: 0.2 E.U./dL
pH, UA: 6.5 (ref 5.0–8.0)

## 2021-12-11 NOTE — Assessment & Plan Note (Signed)
Since we have urine today, will get uacr. Typically followed by endocrine, at her next visit will check a foot exam but will abstract anything needed from endo visits

## 2021-12-11 NOTE — Assessment & Plan Note (Addendum)
Was seen early 6/23 for UTI symptoms, but UA dip and culture negative. Tx for pyelonephritis, AKI in hospital 7/23. GFR/ creat back to baseline before discharge, but will repeat cmp today CBC baseline elevated d/t history of nonhodgkin's lymphoma-- this has been okayed/cleared by oncology. Repeat cbc to ensure back at baseline. Cultures only grew mixed urogenital floral. So far blood cultures negative. Not d/c on antibiotics. UA today neg leuk, blood. Advised pt to closely monitor symptoms, any fevers, chills ,back pain, to call office. If BP also < 100/60, to call office.

## 2021-12-11 NOTE — Progress Notes (Signed)
I,Sulibeya S Dimas,acting as a Education administrator for [employer], PA-C.,have documented all relevant documentation on the behalf of Lori Kirschner, PA-C,as directed by  Lori Kirschner, PA-C while in the presence of Lori Kirschner, PA-C.   Established patient visit   Patient: Lori [name]   DOB: 1976/03/20   46 y.o. Female  MRN: [id number] Visit Date: 12/11/2021  Today's healthcare provider: [name], PA-C   Chief Complaint  Patient presents with   Hospitalization Follow-up   Subjective    HPI  Follow up Hospitalization  Patient was admitted to [facility] on 12/08/21 and discharged on 12/10/21. She was treated for sepsis with acute renal failure secondary to UTI and pyelonephritis Treatment for this included labs. Telephone follow up was done on  She reports excellent compliance with treatment. She reports this condition is improved.  Denies any current symptoms aside from fatigue. ----------------------------------------------------------------------------------------- -  Medications: Outpatient Medications Prior to Visit  Medication Sig   ALPRAZolam (XANAX) 0.5 MG tablet TAKE 1 TABLET BY MOUTH AT BEDTIME AS NEEDED FOR ANXIETY AND 1/2 TAB DURING THE DAY FOR PANIC ATTACKS. DO NOT TAKE WITH OTHER MUSCLE RELAXANTS   atorvastatin (LIPITOR) 40 MG tablet Take 1 tablet by mouth daily.   BD PEN NEEDLE NANO U/F 32G X 4 MM MISC Use with Insulin injctions   Continuous Blood Gluc Sensor (DEXCOM G6 SENSOR) MISC SMARTSIG:1 Each Topical Every 10 Days   Continuous Blood Gluc Transmit (DEXCOM G6 TRANSMITTER) MISC USE TO MONITOR BLOOD SUGARS. CHANGE EVERY 90 DAYS.   cyclobenzaprine (FLEXERIL) 5 MG tablet 1 TAB BY MOUTH 3X/DAY AS NEEDED FOR MUSCLE SPASMS. AVOID OTHER MUSCLE RELAXANTS OR SEDATIVES   glimepiride (AMARYL) 2 MG tablet Take 2 mg by mouth every morning.   levonorgestrel (MIRENA) 20 MCG/24HR IUD 1 each by Intrauterine route once.   levothyroxine (SYNTHROID) 88 MCG tablet Take 88 mcg by mouth  daily.   lisinopril (PRINIVIL,ZESTRIL) 5 MG tablet Take 5 mg by mouth at bedtime.    metoprolol succinate (TOPROL-XL) 50 MG 24 hr tablet TAKE 1 TABLET BY MOUTH EVERY DAY   pantoprazole (PROTONIX) 40 MG tablet TAKE 1 TABLET BY MOUTH EVERY DAY   SYNJARDY XR 12.10-998 MG TB24 Take 2 tablets by mouth every morning.   venlafaxine XR (EFFEXOR-XR) 37.5 MG 24 hr capsule Take 1 capsule (37.5 mg total) by mouth daily with breakfast. (Patient taking differently: Take 75 mg by mouth daily with breakfast.)   [DISCONTINUED] atorvastatin (LIPITOR) 20 MG tablet Take 20 mg by mouth at bedtime.   sitaGLIPtin (JANUVIA) 100 MG tablet Take 100 mg by mouth daily. (Patient not taking: Reported on 12/11/2021)   [DISCONTINUED] empagliflozin (JARDIANCE) 25 MG TABS tablet Take by mouth. (Patient not taking: Reported on 12/11/2021)   [DISCONTINUED] metFORMIN (GLUCOPHAGE-XR) 500 MG 24 hr tablet Take 1,000 mg by mouth 2 (two) times daily. (Patient not taking: Reported on 12/11/2021)   No facility-administered medications prior to visit.    Review of Systems  Constitutional:  Negative for fatigue and fever.  Respiratory:  Negative for cough and shortness of breath.   Cardiovascular:  Negative for chest pain and leg swelling.  Gastrointestinal:  Negative for abdominal pain.  Neurological:  Negative for dizziness and headaches.    Last CBC Lab Results  Component Value Date   WBC 11.3 (H) 11/12/2021   HGB 12.7 11/12/2021   HCT 38.1 11/12/2021   MCV 83 11/12/2021   MCH 27.5 11/12/2021   RDW 15.4 11/12/2021   PLT 519 (H)  56/38/9373   Last metabolic panel Lab Results  Component Value Date   GLUCOSE 97 10/14/2021   NA 138 10/14/2021   K 4.7 10/14/2021   CL 101 10/14/2021   CO2 21 10/14/2021   BUN 14 10/14/2021   CREATININE 0.71 10/14/2021   EGFR 106 10/14/2021   CALCIUM 9.9 10/14/2021   PHOS 2.9 04/11/2014   PROT 7.4 10/14/2021   ALBUMIN 4.7 10/14/2021   LABGLOB 2.7 10/14/2021   AGRATIO 1.7 10/14/2021    BILITOT 0.3 10/14/2021   ALKPHOS 68 10/14/2021   AST 17 10/14/2021   ALT 20 10/14/2021   ANIONGAP 8 03/02/2017   Last hemoglobin A1c Lab Results  Component Value Date   HGBA1C 7.6 05/22/2021       Objective    BP 135/82 (BP Location: Right Arm, Patient Position: Sitting, Cuff Size: Normal)   Pulse 76   Temp 98.4 F (36.9 C) (Oral)   Resp 16   Ht '5\' 6"'  (1.676 m)   Wt 146 lb (66.2 kg)   SpO2 100%   BMI 23.57 kg/m  BP Readings from Last 3 Encounters:  12/11/21 135/82  11/12/21 (!) 89/66  10/12/21 100/67   Wt Readings from Last 3 Encounters:  12/11/21 146 lb (66.2 kg)  11/12/21 143 lb (64.9 kg)  10/12/21 145 lb 14.4 oz (66.2 kg)      Physical Exam Constitutional:      General: She is awake.     Appearance: She is well-developed.  HENT:     Head: Normocephalic.  Eyes:     Conjunctiva/sclera: Conjunctivae normal.  Cardiovascular:     Rate and Rhythm: Normal rate and regular rhythm.     Heart sounds: Normal heart sounds.  Pulmonary:     Effort: Pulmonary effort is normal.     Breath sounds: Normal breath sounds.  Skin:    General: Skin is warm.  Neurological:     Mental Status: She is alert and oriented to person, place, and time.  Psychiatric:        Attention and Perception: Attention normal.        Mood and Affect: Mood normal.        Speech: Speech normal.        Behavior: Behavior is cooperative.      Results for orders placed or performed in visit on 12/11/21  POCT urinalysis dipstick  Result Value Ref Range   Color, UA yellow    Clarity, UA clear    Glucose, UA Positive (A) Negative   Bilirubin, UA negative    Ketones, UA negative    Spec Grav, UA 1.010 1.010 - 1.025   Blood, UA negative    pH, UA 6.5 5.0 - 8.0   Protein, UA Positive (A) Negative   Urobilinogen, UA 0.2 0.2 or 1.0 E.U./dL   Nitrite, UA negative    Leukocytes, UA Negative Negative    Assessment & Plan     Problem List Items Addressed This Visit       Endocrine    Diabetes mellitus, type 2 ([place])    Since we have urine today, will get uacr. Typically followed by endocrine, at her next visit will check a foot exam but will abstract anything needed from endo visits      Relevant Medications   atorvastatin (LIPITOR) 40 MG tablet   SYNJARDY XR 12.10-998 MG TB24   Other Relevant Orders   Microalbumin / creatinine urine ratio     Other   History of  pyelonephritis - Primary    Was seen early 6/23 for UTI symptoms, but UA dip and culture negative. Tx for pyelonephritis, AKI in hospital 7/23. GFR/ creat back to baseline before discharge, but will repeat cmp today CBC baseline elevated d/t history of nonhodgkin's lymphoma-- this has been okayed/cleared by oncology. Repeat cbc to ensure back at baseline. Cultures only grew mixed urogenital floral. So far blood cultures negative. Not d/c on antibiotics. UA today neg leuk, blood. Advised pt to closely monitor symptoms, any fevers, chills ,back pain, to call office. If BP also < 100/60, to call office.      Relevant Orders   POCT urinalysis dipstick (Completed)   Comprehensive Metabolic Panel (CMET)   CBC w/Diff/Platelet     Return in about 6 weeks (around 01/22/2022) for chronic conditions.      I, Lori Kirschner, PA-C have reviewed all documentation for this visit. The documentation on  12/11/2021 for the exam, diagnosis, procedures, and orders are all accurate and complete.  Lori Kirschner, PA-C [facility] [address] [place], [place], [address] Office: [phone] Fax: [place]

## 2021-12-12 LAB — COMPREHENSIVE METABOLIC PANEL
ALT: 21 IU/L (ref 0–32)
AST: 12 IU/L (ref 0–40)
Albumin/Globulin Ratio: 1.6 (ref 1.2–2.2)
Albumin: 4.5 g/dL (ref 3.8–4.8)
Alkaline Phosphatase: 88 IU/L (ref 44–121)
BUN/Creatinine Ratio: 24 — ABNORMAL HIGH (ref 9–23)
BUN: 16 mg/dL (ref 6–24)
Bilirubin Total: 0.2 mg/dL (ref 0.0–1.2)
CO2: 19 mmol/L — ABNORMAL LOW (ref 20–29)
Calcium: 9.7 mg/dL (ref 8.7–10.2)
Chloride: 102 mmol/L (ref 96–106)
Creatinine, Ser: 0.66 mg/dL (ref 0.57–1.00)
Globulin, Total: 2.8 g/dL (ref 1.5–4.5)
Glucose: 148 mg/dL — ABNORMAL HIGH (ref 70–99)
Potassium: 5 mmol/L (ref 3.5–5.2)
Sodium: 141 mmol/L (ref 134–144)
Total Protein: 7.3 g/dL (ref 6.0–8.5)
eGFR: 109 mL/min/{1.73_m2} (ref >59)

## 2021-12-12 LAB — CBC WITH DIFFERENTIAL/PLATELET
Basophils Absolute: 0.1 10*3/uL (ref 0.0–0.2)
Basos: 1 %
EOS (ABSOLUTE): 0.1 10*3/uL (ref 0.0–0.4)
Eos: 1 %
Hematocrit: 33.4 % — ABNORMAL LOW (ref 34.0–46.6)
Hemoglobin: 11.2 g/dL (ref 11.1–15.9)
Immature Grans (Abs): 0.1 10*3/uL (ref 0.0–0.1)
Immature Granulocytes: 1 %
Lymphocytes Absolute: 4.1 10*3/uL — ABNORMAL HIGH (ref 0.7–3.1)
Lymphs: 34 %
MCH: 27.5 pg (ref 26.6–33.0)
MCHC: 33.5 g/dL (ref 31.5–35.7)
MCV: 82 fL (ref 79–97)
Monocytes Absolute: 1.1 10*3/uL — ABNORMAL HIGH (ref 0.1–0.9)
Monocytes: 9 %
NRBC: 1 % — ABNORMAL HIGH (ref 0–0)
Neutrophils Absolute: 6.5 10*3/uL (ref 1.4–7.0)
Neutrophils: 54 %
Platelets: 445 10*3/uL (ref 150–450)
RBC: 4.07 x10E6/uL (ref 3.77–5.28)
RDW: 15.1 % (ref 11.7–15.4)
WBC: 11.9 10*3/uL — ABNORMAL HIGH (ref 3.4–10.8)

## 2021-12-12 LAB — MICROALBUMIN / CREATININE URINE RATIO
Creatinine, Urine: 59.5 mg/dL
Microalb/Creat Ratio: 47 mg/g creat — ABNORMAL HIGH (ref 0–29)
Microalbumin, Urine: 27.9 ug/mL

## 2021-12-15 ENCOUNTER — Encounter: Payer: Self-pay | Admitting: Physician Assistant

## 2021-12-16 ENCOUNTER — Ambulatory Visit: Payer: BC Managed Care – PPO | Admitting: Physician Assistant

## 2021-12-23 ENCOUNTER — Ambulatory Visit: Payer: Self-pay | Admitting: *Deleted

## 2021-12-23 ENCOUNTER — Ambulatory Visit: Payer: BC Managed Care – PPO | Admitting: Physician Assistant

## 2021-12-23 ENCOUNTER — Encounter: Payer: Self-pay | Admitting: Physician Assistant

## 2021-12-23 VITALS — BP 96/66 | HR 93 | Temp 98.7°F | Resp 16 | Wt 148.0 lb

## 2021-12-23 DIAGNOSIS — R300 Dysuria: Secondary | ICD-10-CM | POA: Diagnosis not present

## 2021-12-23 DIAGNOSIS — R310 Gross hematuria: Secondary | ICD-10-CM

## 2021-12-23 LAB — POCT URINALYSIS DIPSTICK
Bilirubin, UA: NEGATIVE
Glucose, UA: POSITIVE — AB
Ketones, UA: NEGATIVE
Nitrite, UA: NEGATIVE
Protein, UA: NEGATIVE
Spec Grav, UA: <=1.005 — AB (ref 1.010–1.025)
Urobilinogen, UA: 0.2 E.U./dL
pH, UA: 6 (ref 5.0–8.0)

## 2021-12-23 NOTE — Telephone Encounter (Signed)
Message from Nani Ravens sent at 12/23/2021  1:02 PM EDT  Summary: possible UTI   Pt called in for assistance. Pt says that she is experiencing UTI symptoms such as pain after urinating, urgent and frequency to urinate but not much is coming out. Advised pt of next opening in office (tomorrow) pt says that she is in pain and would really like assistance today if possible.           Call History   Type Contact Phone/Fax User  12/23/2021 12:55 PM EDT Phone (Incoming) Ute, Bendena A (Self) [phone] Lemmie Evens) Wynetta Emery, [place] C   Encounter Report  Patient Encounter Report   Reason for Disposition  Side (flank) or lower back pain present  Answer Assessment - Initial Assessment Questions 1. SEVERITY: "How bad is the pain?"  (e.g., Scale 1-10; mild, moderate, or severe)   - MILD (1-3): complains slightly about urination hurting   - MODERATE (4-7): interferes with normal activities     - SEVERE (8-10): excruciating, unwilling or unable to urinate because of the pain      A little lower back pain when I pee.    Right side but it's not bad.   Burns when urinating, urgency, still feel like I have to go, frequency too. I just got out of hospital 2 wks ago for a bad UTI went to my kidneys.   I've been fine until now.   2. FREQUENCY: "How many times have you had painful urination today?"      It hurts after I finish urinating.   No pressure or pain in abd.  3. PATTERN: "Is pain present every time you urinate or just sometimes?"      Every time  4. ONSET: "When did the painful urination start?"      A couple of hours ago the symptoms started.   I'm more careful since I was in the hospital.   It really hurts. 5. FEVER: "Do you have a fever?" If Yes, ask: "What is your temperature, how was it measured, and when did it start?"     No 6. PAST UTI: "Have you had a urine infection before?" If Yes, ask: "When was the last time?" and "What happened that time?"      Yes was in the hospital  7.  CAUSE: "What do you think is causing the painful urination?"  (e.g., UTI, scratch, Herpes sore)     UTI  8. OTHER SYMPTOMS: "Do you have any other symptoms?" (e.g., blood in urine, flank pain, genital sores, urgency, vaginal discharge)     Frequency, urgency,, not empying  9. PREGNANCY: "Is there any chance you are pregnant?" "When was your last menstrual period?"     N/A  Protocols used: Urination Pain - Female-A-AH

## 2021-12-23 NOTE — Telephone Encounter (Signed)
  Chief Complaint: burning with and after urinating, frequency, urgency, mild right lower back pain, feel like she has to go again soon afterwards.   Recently in hospital with UTI involving kidneys. Symptoms: See above Frequency: Symptoms started 2 hours ago. Pertinent Negatives: Patient denies blood in urine Disposition: '[]'$ ED /'[]'$ Urgent Care (no appt availability in office) / '[x]'$ Appointment(In office/virtual)/ '[]'$  [facility]/ '[]'$ Home Care/ '[]'$ Refused Recommended Disposition /'[]'$ [facility]/ '[]'$  Follow-up with PCP Additional Notes: Appt made for today with Mardene Speak, PA for 3:00.

## 2021-12-23 NOTE — Progress Notes (Signed)
Established patient visit  I,Joseline E Rosas,acting as a scribe for [employer], PA-C.,have documented all relevant documentation on the behalf of Mardene Speak, PA-C,as directed by  [employer], PA-C while in the presence of [employer], PA-C.   Patient: Lori [name]   DOB: [birth year]   46 y.o. Female  MRN: [id number] Visit Date: 12/23/2021  Today's healthcare provider: Mardene Speak, PA-C   Chief Complaint  Patient presents with   Dysuria   Subjective    Dysuria  This is a new problem. The current episode started today. The problem occurs every urination. The problem has been gradually worsening. The quality of the pain is described as aching and burning. There has been no fever. Associated symptoms include urgency. Associated symptoms comments: Feels like she is not emptying bladder all the way. She has tried increased fluids for the symptoms.   Pt was seen at [facility] ED on 12/08/21, she was admitted at the same day, discharged on 12/10/21. Was diagnosed with Sepsis likely secondary to urinary source. Was treated with vancomycin plus cefepime  UA showed small leukocyte esterase, 5 RBC, 31 WBC, and rare bacteria with culture growing mixed urogenital flora. CT AP w/o contrast showed no evidence of nephrolithiasis. Pt was discharged off of antibiotics. Was diagnosed with Pre-renal AKI, and improved with fluid resuscitation to 0.63 at time of DC. Had Neutrophilic Leukocytosis: WBC 21.7 in the ED, has been previously elevated on prior labs as well.  Pt saw PCP on 12/11/21 was negative UA for leuko and blood, was advised pt to closely monitor her symptoms, fevers, chills, back pain Today, she returned back with urinary symptoms  Medications: Outpatient Medications Prior to Visit  Medication Sig   ALPRAZolam (XANAX) 0.5 MG tablet TAKE 1 TABLET BY MOUTH AT BEDTIME AS NEEDED FOR ANXIETY AND 1/2 TAB DURING THE DAY FOR PANIC ATTACKS. DO NOT TAKE WITH OTHER MUSCLE RELAXANTS   atorvastatin  (LIPITOR) 40 MG tablet Take 1 tablet by mouth daily.   BD PEN NEEDLE NANO U/F 32G X 4 MM MISC Use with Insulin injctions   Continuous Blood Gluc Sensor (DEXCOM G6 SENSOR) MISC SMARTSIG:1 Each Topical Every 10 Days   Continuous Blood Gluc Transmit (DEXCOM G6 TRANSMITTER) MISC USE TO MONITOR BLOOD SUGARS. CHANGE EVERY 90 DAYS.   cyclobenzaprine (FLEXERIL) 5 MG tablet 1 TAB BY MOUTH 3X/DAY AS NEEDED FOR MUSCLE SPASMS. AVOID OTHER MUSCLE RELAXANTS OR SEDATIVES   glimepiride (AMARYL) 2 MG tablet Take 2 mg by mouth every morning.   levonorgestrel (MIRENA) 20 MCG/24HR IUD 1 each by Intrauterine route once.   levothyroxine (SYNTHROID) 88 MCG tablet Take 88 mcg by mouth daily.   lisinopril (PRINIVIL,ZESTRIL) 5 MG tablet Take 5 mg by mouth at bedtime.    metoprolol succinate (TOPROL-XL) 50 MG 24 hr tablet TAKE 1 TABLET BY MOUTH EVERY DAY   pantoprazole (PROTONIX) 40 MG tablet TAKE 1 TABLET BY MOUTH EVERY DAY   sitaGLIPtin (JANUVIA) 100 MG tablet Take 100 mg by mouth daily.   SYNJARDY XR 12.10-998 MG TB24 Take 2 tablets by mouth every morning.   venlafaxine XR (EFFEXOR-XR) 37.5 MG 24 hr capsule Take 1 capsule (37.5 mg total) by mouth daily with breakfast. (Patient taking differently: Take 75 mg by mouth daily with breakfast.)   No facility-administered medications prior to visit.    Review of Systems  Genitourinary:  Positive for dysuria and urgency.       Objective    BP 96/66 (BP Location: Left  Arm, Patient Position: Sitting, Cuff Size: Normal)   Pulse 93   Temp 98.7 F (37.1 C) (Oral)   Resp 16   Wt 148 lb (67.1 kg)   BMI 23.89 kg/m    Physical Exam Vitals reviewed.  Constitutional:      General: She is in acute distress.     Appearance: Normal appearance. She is well-developed.  HENT:     Head: Normocephalic and atraumatic.     Nose: Nose normal.  Eyes:     General: No scleral icterus.    Conjunctiva/sclera: Conjunctivae normal.  Cardiovascular:     Rate and Rhythm: Normal  rate and regular rhythm.     Heart sounds: No murmur heard. Pulmonary:     Effort: Pulmonary effort is normal. No respiratory distress.     Breath sounds: Normal breath sounds. No wheezing or rales.  Abdominal:     General: There is no distension.     Palpations: Abdomen is soft.     Tenderness: There is abdominal tenderness in the suprapubic area. There is no guarding or rebound.  Skin:    General: Skin is warm and dry.     Capillary Refill: Capillary refill takes less than 2 seconds.     Findings: No rash.  Neurological:     Mental Status: She is alert and oriented to person, place, and time. Mental status is at baseline.  Psychiatric:        Behavior: Behavior normal.        Thought Content: Thought content normal.        Judgment: Judgment normal.     Results for orders placed or performed in visit on 12/23/21  POCT urinalysis dipstick  Result Value Ref Range   Color, UA Yellow    Clarity, UA Cloudy    Glucose, UA Positive (A) Negative   Bilirubin, UA Negative    Ketones, UA Negative    Spec Grav, UA <=1.005 (A) 1.010 - 1.025   Blood, UA Large    pH, UA 6.0 5.0 - 8.0   Protein, UA Negative Negative   Urobilinogen, UA 0.2 0.2 or 1.0 E.U./dL   Nitrite, UA Negative    Leukocytes, UA Small (1+) (A) Negative   Appearance     Odor      Assessment & Plan     1. Dysuria Most likely due to complicated UTI Might start antibiotic if CBC and Micro will be positive for infection - POCT urinalysis dipstick small leuko - Urine Microscopic - Urine Culture - Ambulatory referral to Urology  2. Gross hematuria Could be due to complicated UTI Was negative for nephrolithiasis/imaging at [facility] on 7/4-12/10/21 - Ambulatory referral to Urology - CBC with Differential/Platelet - Comprehensive metabolic panel Might start antibiotic.   FU as scheduled     The patient was advised to call back or seek an in-person evaluation if the symptoms worsen or if the condition fails  to improve as anticipated.  I discussed the assessment and treatment plan with the patient. The patient was provided an opportunity to ask questions and all were answered. The patient agreed with the plan and demonstrated an understanding of the instructions.  The entirety of the information documented in the History of Present Illness, Review of Systems and Physical Exam were personally obtained by me. Portions of this information were initially documented by the CMA and reviewed by me for thoroughness and accuracy.  Portions of this note were created using dictation software and may contain  typographical errors.     Mardene Speak, PA-C  [facility] [phone] (phone) [phone] (fax)  [place]

## 2021-12-24 ENCOUNTER — Encounter: Payer: Self-pay | Admitting: Physician Assistant

## 2021-12-24 ENCOUNTER — Other Ambulatory Visit: Payer: Self-pay | Admitting: Physician Assistant

## 2021-12-24 DIAGNOSIS — R319 Hematuria, unspecified: Secondary | ICD-10-CM

## 2021-12-24 LAB — CBC WITH DIFFERENTIAL/PLATELET
Basophils Absolute: 0.2 10*3/uL (ref 0.0–0.2)
Basos: 1 %
EOS (ABSOLUTE): 0.2 10*3/uL (ref 0.0–0.4)
Eos: 1 %
Hematocrit: 34.3 % (ref 34.0–46.6)
Hemoglobin: 11.3 g/dL (ref 11.1–15.9)
Immature Grans (Abs): 0 10*3/uL (ref 0.0–0.1)
Immature Granulocytes: 0 %
Lymphocytes Absolute: 5.1 10*3/uL — ABNORMAL HIGH (ref 0.7–3.1)
Lymphs: 30 %
MCH: 27.4 pg (ref 26.6–33.0)
MCHC: 32.9 g/dL (ref 31.5–35.7)
MCV: 83 fL (ref 79–97)
Monocytes Absolute: 1.4 10*3/uL — ABNORMAL HIGH (ref 0.1–0.9)
Monocytes: 8 %
Neutrophils Absolute: 10.3 10*3/uL — ABNORMAL HIGH (ref 1.4–7.0)
Neutrophils: 60 %
Platelets: 621 10*3/uL — ABNORMAL HIGH (ref 150–450)
RBC: 4.12 x10E6/uL (ref 3.77–5.28)
RDW: 15.6 % — ABNORMAL HIGH (ref 11.7–15.4)
WBC: 17.2 10*3/uL — ABNORMAL HIGH (ref 3.4–10.8)

## 2021-12-24 LAB — COMPREHENSIVE METABOLIC PANEL
ALT: 11 IU/L (ref 0–32)
AST: 11 IU/L (ref 0–40)
Albumin/Globulin Ratio: 1.7 (ref 1.2–2.2)
Albumin: 4.7 g/dL (ref 3.9–4.9)
Alkaline Phosphatase: 82 IU/L (ref 44–121)
BUN/Creatinine Ratio: 21 (ref 9–23)
BUN: 13 mg/dL (ref 6–24)
Bilirubin Total: <0.2 mg/dL (ref 0.0–1.2)
CO2: 21 mmol/L (ref 20–29)
Calcium: 9.7 mg/dL (ref 8.7–10.2)
Chloride: 102 mmol/L (ref 96–106)
Creatinine, Ser: 0.62 mg/dL (ref 0.57–1.00)
Globulin, Total: 2.7 g/dL (ref 1.5–4.5)
Glucose: 188 mg/dL — ABNORMAL HIGH (ref 70–99)
Potassium: 4.9 mmol/L (ref 3.5–5.2)
Sodium: 136 mmol/L (ref 134–144)
Total Protein: 7.4 g/dL (ref 6.0–8.5)
eGFR: 111 mL/min/{1.73_m2} (ref >59)

## 2021-12-24 LAB — URINALYSIS, MICROSCOPIC ONLY
Bacteria, UA: NONE SEEN
Casts: NONE SEEN /lpf
WBC, UA: >30 /hpf — AB (ref 0–5)

## 2021-12-24 MED ORDER — CEFPODOXIME PROXETIL 200 MG PO TABS: 200.0000 mg | ORAL_TABLET | Freq: Two times a day (BID) | ORAL | 0 refills | Status: DC

## 2021-12-24 NOTE — Progress Notes (Unsigned)
Antibiotic was sent to [facility] in [place]

## 2021-12-24 NOTE — Progress Notes (Signed)
Henlawson ,   Your labwork results all are back.  Your WBCs/platelets are increased. I will place an order for antibiotic. We will not wait for urine culture results.  Your sugar is increased . We could recheck them later.  No changes need to be made to medications, and no further tests need to be ordered.  Any questions please reach out to the office or message me on MyChart!  Best, Mardene Speak, PA-C

## 2021-12-25 LAB — URINE CULTURE

## 2021-12-25 NOTE — Progress Notes (Signed)
Lori [name], the results of urine culture are back and like last time they are negative. However, considering the previous medical hx, please, continue with your current medication. Please, let us know how are you feeling and if you were able to schedule an appt with Urology

## 2022-01-11 ENCOUNTER — Ambulatory Visit (INDEPENDENT_AMBULATORY_CARE_PROVIDER_SITE_OTHER): Payer: BC Managed Care – PPO | Admitting: Dermatology

## 2022-01-11 DIAGNOSIS — Z1283 Encounter for screening for malignant neoplasm of skin: Secondary | ICD-10-CM

## 2022-01-11 DIAGNOSIS — L578 Other skin changes due to chronic exposure to nonionizing radiation: Secondary | ICD-10-CM | POA: Diagnosis not present

## 2022-01-11 DIAGNOSIS — L821 Other seborrheic keratosis: Secondary | ICD-10-CM

## 2022-01-11 DIAGNOSIS — L814 Other melanin hyperpigmentation: Secondary | ICD-10-CM

## 2022-01-11 DIAGNOSIS — D1800 Hemangioma unspecified site: Secondary | ICD-10-CM

## 2022-01-11 DIAGNOSIS — D225 Melanocytic nevi of trunk: Secondary | ICD-10-CM | POA: Diagnosis not present

## 2022-01-11 DIAGNOSIS — D229 Melanocytic nevi, unspecified: Secondary | ICD-10-CM

## 2022-01-11 DIAGNOSIS — L988 Other specified disorders of the skin and subcutaneous tissue: Secondary | ICD-10-CM | POA: Diagnosis not present

## 2022-01-11 NOTE — Patient Instructions (Addendum)
     Melanoma ABCDEs  Melanoma is the most dangerous type of skin cancer, and is the leading cause of death from skin disease.  You are more likely to develop melanoma if you: Have light-colored skin, light-colored eyes, or red or blond hair Spend a lot of time in the sun Tan regularly, either outdoors or in a tanning bed Have had blistering sunburns, especially during childhood Have a close family member who has had a melanoma Have atypical moles or large birthmarks  Early detection of melanoma is key since treatment is typically straightforward and cure rates are extremely high if we catch it early.   The first sign of melanoma is often a change in a mole or a new dark spot.  The ABCDE system is a way of remembering the signs of melanoma.  A for asymmetry:  The two halves do not match. B for border:  The edges of the growth are irregular. C for color:  A mixture of colors are present instead of an even brown color. D for diameter:  Melanomas are usually (but not always) greater than 6mm - the size of a pencil eraser. E for evolution:  The spot keeps changing in size, shape, and color.  Please check your skin once per month between visits. You can use a small mirror in front and a large mirror behind you to keep an eye on the back side or your body.   If you see any new or changing lesions before your next follow-up, please call to schedule a visit.  Please continue daily skin protection including broad spectrum sunscreen SPF 30+ to sun-exposed areas, reapplying every 2 hours as needed when you're outdoors.   Staying in the shade or wearing long sleeves, sun glasses (UVA+UVB protection) and wide brim hats (4-inch brim around the entire circumference of the hat) are also recommended for sun protection.    Due to recent changes in healthcare laws, you may see results of your pathology and/or laboratory studies on MyChart before the doctors have had a chance to review them. We  understand that in some cases there may be results that are confusing or concerning to you. Please understand that not all results are received at the same time and often the doctors may need to interpret multiple results in order to provide you with the best plan of care or course of treatment. Therefore, we ask that you please give us 2 business days to thoroughly review all your results before contacting the office for clarification. Should we see a critical lab result, you will be contacted sooner.   If You Need Anything After Your Visit  If you have any questions or concerns for your doctor, please call our main line at [phone] and press option 4 to reach your doctor's medical assistant. If no one answers, please leave a voicemail as directed and we will return your call as soon as possible. Messages left after 4 pm will be answered the following business day.   You may also send us a message via MyChart. We typically respond to MyChart messages within 1-2 business days.  For prescription refills, please ask your pharmacy to contact our office. Our fax number is [phone].  If you have an urgent issue when the clinic is closed that cannot wait until the next business day, you can page your doctor at the number below.    Please note that while we do our best to be available for urgent issues   outside of office hours, we are not available 24/7.   If you have an urgent issue and are unable to reach us, you may choose to seek medical care at your doctor's office, retail clinic, urgent care center, or emergency room.  If you have a medical emergency, please immediately call 911 or go to the emergency department.  Pager Numbers  - Dr. Kowalski: [phone]  - Dr. Moye: [phone]  - Dr. Stewart: [phone]  In the event of inclement weather, please call our main line at [phone] for an update on the status of any delays or closures.  Dermatology Medication Tips: Please  keep the boxes that topical medications come in in order to help keep track of the instructions about where and how to use these. Pharmacies typically print the medication instructions only on the boxes and not directly on the medication tubes.   If your medication is too expensive, please contact our office at [phone] option 4 or send us a message through MyChart.   We are unable to tell what your co-pay for medications will be in advance as this is different depending on your insurance coverage. However, we may be able to find a substitute medication at lower cost or fill out paperwork to get insurance to cover a needed medication.   If a prior authorization is required to get your medication covered by your insurance company, please allow us 1-2 business days to complete this process.  Drug prices often vary depending on where the prescription is filled and some pharmacies may offer cheaper prices.  The website www.goodrx.com contains coupons for medications through different pharmacies. The prices here do not account for what the cost may be with help from insurance (it may be cheaper with your insurance), but the website can give you the price if you did not use any insurance.  - You can print the associated coupon and take it with your prescription to the pharmacy.  - You may also stop by our office during regular business hours and pick up a GoodRx coupon card.  - If you need your prescription sent electronically to a different pharmacy, notify our office through [place] MyChart or by phone at [phone] option 4.     Si Usted Necesita Algo Despus de Su Visita  Tambin puede enviarnos un mensaje a travs de MyChart. Por lo general respondemos a los mensajes de MyChart en el transcurso de 1 a 2 das hbiles.  Para renovar recetas, por favor pida a su farmacia que se ponga en contacto con nuestra oficina. Nuestro nmero de fax es el [phone].  Si tiene un asunto urgente  cuando la clnica est cerrada y que no puede esperar hasta el siguiente da hbil, puede llamar/localizar a su doctor(a) al nmero que aparece a continuacin.   Por favor, tenga en cuenta que aunque hacemos todo lo posible para estar disponibles para asuntos urgentes fuera del horario de oficina, no estamos disponibles las 24 horas del da, los 7 das de la semana.   Si tiene un problema urgente y no puede comunicarse con nosotros, puede optar por buscar atencin mdica  en el consultorio de su doctor(a), en una clnica privada, en un centro de atencin urgente o en una sala de emergencias.  Si tiene una emergencia mdica, por favor llame inmediatamente al 911 o vaya a la sala de emergencias.  Nmeros de bper  - Dr. Kowalski: [phone]  - Dra. Moye: [phone]  - Dra. Stewart: [phone]  En caso   de inclemencias del tiempo, por favor llame a nuestra lnea principal al [phone] para una actualizacin sobre el estado de cualquier retraso o cierre.  Consejos para la medicacin en dermatologa: Por favor, guarde las cajas en las que vienen los medicamentos de uso tpico para ayudarle a seguir las instrucciones sobre dnde y cmo usarlos. Las farmacias generalmente imprimen las instrucciones del medicamento slo en las cajas y no directamente en los tubos del medicamento.   Si su medicamento es muy caro, por favor, pngase en contacto con nuestra oficina llamando al [phone] y presione la opcin 4 o envenos un mensaje a travs de MyChart.   No podemos decirle cul ser su copago por los medicamentos por adelantado ya que esto es diferente dependiendo de la cobertura de su seguro. Sin embargo, es posible que podamos encontrar un medicamento sustituto a menor costo o llenar un formulario para que el seguro cubra el medicamento que se considera necesario.   Si se requiere una autorizacin previa para que su compaa de seguros cubra su medicamento, por favor permtanos de 1 a 2  das hbiles para completar este proceso.  Los precios de los medicamentos varan con frecuencia dependiendo del lugar de dnde se surte la receta y alguna farmacias pueden ofrecer precios ms baratos.  El sitio web www.goodrx.com tiene cupones para medicamentos de diferentes farmacias. Los precios aqu no tienen en cuenta lo que podra costar con la ayuda del seguro (puede ser ms barato con su seguro), pero el sitio web puede darle el precio si no utiliz ningn seguro.  - Puede imprimir el cupn correspondiente y llevarlo con su receta a la farmacia.  - Tambin puede pasar por nuestra oficina durante el horario de atencin regular y recoger una tarjeta de cupones de GoodRx.  - Si necesita que su receta se enve electrnicamente a una farmacia diferente, informe a nuestra oficina a travs de MyChart de [place] o por telfono llamando al [phone] y presione la opcin 4.  

## 2022-01-11 NOTE — Progress Notes (Signed)
Follow-Up Visit   Subjective  Lori [name] is a 46 y.o. female who presents for the following: Annual Exam.  The patient presents for Total-Body Skin Exam (TBSE) for skin cancer screening and mole check.  The patient has spots, moles and lesions to be evaluated, some may be new or changing. No history of skin cancer.    The following portions of the chart were reviewed this encounter and updated as appropriate:       Review of Systems:  No other skin or systemic complaints except as noted in HPI or Assessment and Plan.  Objective  Well appearing patient in no apparent distress; mood and affect are within normal limits.  A full examination was performed including scalp, head, eyes, ears, nose, lips, neck, chest, axillae, abdomen, back, buttocks, bilateral upper extremities, bilateral lower extremities, hands, feet, fingers, toes, fingernails, and toenails. All findings within normal limits unless otherwise noted below.  face Rhytides and volume loss.   Left Abdomen 4.0 mm brown macule, lighter rim  Right Inframammary 5.0 x 2.5 mm brown macule    Assessment & Plan  Skin cancer screening performed today.  Actinic Damage - chronic, secondary to cumulative UV radiation exposure/sun exposure over time - diffuse scaly erythematous macules with underlying dyspigmentation - Recommend daily broad spectrum sunscreen SPF 30+ to sun-exposed areas, reapply every 2 hours as needed.  - Recommend staying in the shade or wearing long sleeves, sun glasses (UVA+UVB protection) and wide brim hats (4-inch brim around the entire circumference of the hat). - Call for new or changing lesions.  Lentigines - Scattered tan macules - Due to sun exposure - Benign-appering, observe - Recommend daily broad spectrum sunscreen SPF 30+ to sun-exposed areas, reapply every 2 hours as needed. - Call for any changes  Seborrheic Keratoses - Stuck-on, waxy, tan-brown papules and/or plaques  -  Benign-appearing - Discussed benign etiology and prognosis. - Observe - Call for any changes  Melanocytic Nevi - Tan-brown and/or pink-flesh-colored symmetric macules and papules - Benign appearing on exam today - Observation - Call clinic for new or changing moles - Recommend daily use of broad spectrum spf 30+ sunscreen to sun-exposed areas.   Elastosis of skin face  Discussed Retinol or tretinoin nightly as tolerated. Neutrogena with Retinol, samples given.  If patient would like Rx strength, can purchase The Perfect A in office or can send in Rx to pharmacy.  Recommend sunscreen daily to face, chest. Samples given. Recommend Elta-MD  Topical retinoid medications like tretinoin/Retin-A, adapalene/Differin, tazarotene/Fabior, and Epiduo/Epiduo Forte can cause dryness and irritation when first started. Only apply a pea-sized amount to the entire affected area. Avoid applying it around the eyes, edges of mouth and creases at the nose. If you experience irritation, use a good moisturizer first and/or apply the medicine less often. If you are doing well with the medicine, you can increase how often you use it until you are applying every night. Be careful with sun protection while using this medication as it can make you sensitive to the sun. This medicine should not be used by pregnant women.     Nevus (2) Left Abdomen; Right Inframammary  Benign-appearing.  Observation.  Call clinic for new or changing moles.  Recommend daily use of broad spectrum spf 30+ sunscreen to sun-exposed areas.   Hemangiomas - Red papules - Discussed benign nature - Observe - Call for any changes  Return in about 1 year (around 01/12/2023) for TBSE.  Lindi Adie, CMA, am  acting as scribe for Brendolyn Patty, MD .  Documentation: I have reviewed the above documentation for accuracy and completeness, and I agree with the above.  Brendolyn Patty MD

## 2022-01-13 ENCOUNTER — Encounter: Payer: Self-pay | Admitting: Urology

## 2022-01-13 ENCOUNTER — Ambulatory Visit (INDEPENDENT_AMBULATORY_CARE_PROVIDER_SITE_OTHER): Payer: BC Managed Care – PPO | Admitting: Urology

## 2022-01-13 VITALS — BP 93/61 | HR 102 | Ht 66.0 in | Wt 148.0 lb

## 2022-01-13 DIAGNOSIS — R319 Hematuria, unspecified: Secondary | ICD-10-CM

## 2022-01-13 DIAGNOSIS — R8281 Pyuria: Secondary | ICD-10-CM

## 2022-01-13 DIAGNOSIS — R399 Unspecified symptoms and signs involving the genitourinary system: Secondary | ICD-10-CM

## 2022-01-13 LAB — URINALYSIS, COMPLETE
Bilirubin, UA: NEGATIVE
Ketones, UA: NEGATIVE
Leukocytes,UA: NEGATIVE
Nitrite, UA: NEGATIVE
Protein,UA: NEGATIVE
RBC, UA: NEGATIVE
Specific Gravity, UA: 1.015 (ref 1.005–1.030)
Urobilinogen, Ur: 0.2 mg/dL (ref 0.2–1.0)
pH, UA: 6 (ref 5.0–7.5)

## 2022-01-13 LAB — MICROSCOPIC EXAMINATION

## 2022-01-13 MED ORDER — SULFAMETHOXAZOLE-TRIMETHOPRIM 800-160 MG PO TABS: 1.0000 | ORAL_TABLET | Freq: Two times a day (BID) | ORAL | 0 refills | Status: DC

## 2022-01-13 NOTE — Progress Notes (Signed)
01/13/2022 1:42 PM   [facility] [birth year] [id number]  Referring provider: Mardene Speak, PA-C [address] [place],  [place] [address]  Chief Complaint  Patient presents with   Hematuria    HPI: Lori [name] is a 46 y.o. female referred for evaluation of gross hematuria.  Although the referring records mention gross hematuria the patient denies visible blood in the urine Presented to [facility] 12/08/2021 with a 2-day history of malaise, headache, low back pain, dysuria and low-grade fever.  On arrival to the ED she was hypotensive with elevated lactate.  Started on Vanco + cefepime.  UA had 31 WBC/5 RBC and rare bacteria.  Urine culture grew mixed flora and blood cultures were negative.  Also with leukocytosis 21.7 CT abdomen pelvis without contrast showed no calculi, hydronephrosis or GU abnormalities Discharged 12/10/2021.  Discharge meds did not include antibiotic Saw PCP 12/23/2021 complaining of dysuria, urgency and sensation of incomplete bladder emptying Dipstick UA with large blood and small leukocytes.  Microscopy with >30 WBC and 11-30 RBC. Started empirically on cefpodoxime; urine culture subsequently returned insignificant growth Her symptoms resolved and she presently has no complaints   PMH: Past Medical History:  Diagnosis Date   Bacterial vaginitis    CHF (congestive heart failure) (HCC)    Colon polyps    Complication of anesthesia    nausea/vomiting   Depression    Diabetes mellitus without complication ([place])    History of abnormal mammogram 2013   '@[facility]'$ - NEG MRI AND BX OCT 2014 NORMAL   History of mammogram 03/2015   BREAST MRI AT [facility] WNL   History of Papanicolaou smear of cervix 10/08/2013; 08/04/15   -/-; ASCUS/HPV NEG;   Hodgkin's disease ([place]) 1987   She had surgical resection of Lymph nodes and chemo + rad tx's.   Hypertension    Hypothyroidism    2ND TO CA TX   PONV (postoperative nausea and vomiting)    Restrictive lung  disease    Vitamin D deficiency     Surgical History: Past Surgical History:  Procedure Laterality Date   CHOLECYSTECTOMY     DEEP NECK LYMPH NODE BIOPSY / EXCISION  1987   hodgkin lymphoma   DILATATION & CURETTAGE/HYSTEROSCOPY WITH MYOSURE N/A 03/10/2017   Procedure: DILATATION & CURETTAGE/HYSTEROSCOPY WITH MYOSURE;  Surgeon: Will Bonnet, MD;  Location: [facility] ORS;  Service: Gynecology;  Laterality: N/A;   INTRAUTERINE DEVICE (IUD) INSERTION  [id number]; 02/06/09; 12/12/13   INTRAUTERINE DEVICE (IUD) INSERTION N/A 03/10/2017   Procedure: INTRAUTERINE DEVICE (IUD) INSERTION;  Surgeon: Will Bonnet, MD;  Location: [facility] ORS;  Service: Gynecology;  Laterality: N/A;   SPLENECTOMY     THYROIDECTOMY      Home Medications:  Allergies as of 01/13/2022       Reactions   Dapagliflozin Other (See Comments)   Wilder [place]ade) UTI   Liraglutide Nausea Only, Nausea And Vomiting   Victoza Victoza        Medication List        Accurate as of January 13, 2022  1:42 PM. If you have any questions, ask your nurse or doctor.          ALPRAZolam 0.5 MG tablet Commonly known as: XANAX TAKE 1 TABLET BY MOUTH AT BEDTIME AS NEEDED FOR ANXIETY AND 1/2 TAB DURING THE DAY FOR PANIC ATTACKS. DO NOT TAKE WITH OTHER MUSCLE RELAXANTS   atorvastatin 40 MG tablet Commonly known as: LIPITOR Take 1 tablet by mouth daily.  Dexcom G6 Sensor Misc SMARTSIG:1 Each Topical Every 10 Days   Dexcom G6 Transmitter Misc USE TO MONITOR BLOOD SUGARS. CHANGE EVERY 90 DAYS.   glimepiride 2 MG tablet Commonly known as: AMARYL Take 2 mg by mouth every morning.   levonorgestrel 20 MCG/24HR IUD Commonly known as: MIRENA 1 each by Intrauterine route once.   levothyroxine 88 MCG tablet Commonly known as: SYNTHROID Take 88 mcg by mouth daily.   lisinopril 5 MG tablet Commonly known as: ZESTRIL Take 5 mg by mouth at bedtime.   metoprolol succinate 50 MG 24 hr tablet Commonly known as: TOPROL-XL TAKE 1 TABLET  BY MOUTH EVERY DAY   pantoprazole 40 MG tablet Commonly known as: PROTONIX TAKE 1 TABLET BY MOUTH EVERY DAY   Synjardy XR 12.10-998 MG Tb24 Generic drug: Empagliflozin-metFORMIN HCl ER Take 2 tablets by mouth every morning.   venlafaxine XR 37.5 MG 24 hr capsule Commonly known as: EFFEXOR-XR Take 1 capsule (37.5 mg total) by mouth daily with breakfast. What changed: how much to take        Allergies:  Allergies  Allergen Reactions   Dapagliflozin Other (See Comments)    (Farxiga) UTI   Liraglutide Nausea Only and Nausea And Vomiting    Victoza Victoza    Family History: Family History  Problem Relation Age of Onset   Hypertension Mother    Other Maternal Grandfather        bile duct cancer   Colon cancer Paternal Grandmother        19-70    Social History:  reports that she quit smoking about 5 years ago. Her smoking use included cigarettes. She has a 2.50 pack-year smoking history. She has never used smokeless tobacco. She reports current alcohol use of about 2.0 standard drinks of alcohol per week. She reports that she does not use drugs.   Physical Exam: BP 93/61   Pulse (!) 102   Ht '5\' 6"'$  (1.676 m)   Wt 148 lb (67.1 kg)   BMI 23.89 kg/m   Constitutional:  Alert and oriented, No acute distress. HEENT: [place] AT Respiratory: Normal respiratory effort, no increased work of breathing. Skin: No rashes, bruises or suspicious lesions. Neurologic: Grossly intact, no focal deficits, moving all 4 extremities. Psychiatric: Normal mood and affect.  Laboratory Data:  Urinalysis Dipstick 2+ glucose Microscopy negative   Assessment & Plan:   46 y.o. female with previous episode of suspicious for sepsis from a urinary source.  Urinalysis did show pyuria however urine and blood cultures were negative she was treated for 8 hours of IV antibiotics and was not discharged on antibiotic therapy Recurrent dysuria 2 weeks after discharge with urinalysis showing significant  pyuria and microhematuria however urine culture was negative.  Symptoms resolved on antibiotic therapy. CT abdomen pelvis at [facility] showed no GU abnormalities Recommend a 6 week course of low-dose antibiotic prophylaxis and Rx Septra SS daily sent to pharmacy Follow-up appointment 6 weeks for symptom recheck and repeat urinalysis Instructed to call earlier for recurrent symptoms Cystoscopy for recurrent symptoms   Abbie Sons, MD  [place] [address] [place], [place] [address] [phone]

## 2022-01-19 ENCOUNTER — Encounter: Payer: Self-pay | Admitting: Urology

## 2022-01-20 DIAGNOSIS — G43719 Chronic migraine without aura, intractable, without status migrainosus: Secondary | ICD-10-CM | POA: Diagnosis not present

## 2022-01-20 DIAGNOSIS — R519 Headache, unspecified: Secondary | ICD-10-CM | POA: Diagnosis not present

## 2022-01-22 ENCOUNTER — Other Ambulatory Visit: Payer: Self-pay | Admitting: Physician Assistant

## 2022-01-22 ENCOUNTER — Encounter: Payer: Self-pay | Admitting: Physician Assistant

## 2022-01-22 DIAGNOSIS — F320 Major depressive disorder, single episode, mild: Secondary | ICD-10-CM

## 2022-01-22 MED ORDER — VENLAFAXINE HCL ER 75 MG PO CP24: 75.0000 mg | ORAL_CAPSULE | Freq: Every day | ORAL | 1 refills | Status: DC

## 2022-02-10 ENCOUNTER — Other Ambulatory Visit: Payer: Self-pay | Admitting: Orthopedic Surgery

## 2022-02-18 DIAGNOSIS — E785 Hyperlipidemia, unspecified: Secondary | ICD-10-CM | POA: Diagnosis not present

## 2022-02-18 DIAGNOSIS — I152 Hypertension secondary to endocrine disorders: Secondary | ICD-10-CM | POA: Diagnosis not present

## 2022-02-18 DIAGNOSIS — E1165 Type 2 diabetes mellitus with hyperglycemia: Secondary | ICD-10-CM | POA: Diagnosis not present

## 2022-02-18 DIAGNOSIS — E1169 Type 2 diabetes mellitus with other specified complication: Secondary | ICD-10-CM | POA: Diagnosis not present

## 2022-02-18 DIAGNOSIS — E1159 Type 2 diabetes mellitus with other circulatory complications: Secondary | ICD-10-CM | POA: Diagnosis not present

## 2022-02-18 DIAGNOSIS — E890 Postprocedural hypothyroidism: Secondary | ICD-10-CM | POA: Diagnosis not present

## 2022-02-18 DIAGNOSIS — Z794 Long term (current) use of insulin: Secondary | ICD-10-CM | POA: Diagnosis not present

## 2022-02-18 LAB — HEMOGLOBIN A1C: Hemoglobin A1C: 7.9

## 2022-02-21 ENCOUNTER — Other Ambulatory Visit: Payer: Self-pay | Admitting: Physician Assistant

## 2022-02-21 DIAGNOSIS — M62830 Muscle spasm of back: Secondary | ICD-10-CM

## 2022-02-23 ENCOUNTER — Other Ambulatory Visit: Payer: Self-pay | Admitting: Physician Assistant

## 2022-02-23 DIAGNOSIS — M62830 Muscle spasm of back: Secondary | ICD-10-CM

## 2022-02-23 DIAGNOSIS — F411 Generalized anxiety disorder: Secondary | ICD-10-CM

## 2022-02-23 MED ORDER — ALPRAZOLAM 0.5 MG PO TABS: ORAL_TABLET | ORAL | 1 refills | Status: DC

## 2022-02-24 ENCOUNTER — Ambulatory Visit (INDEPENDENT_AMBULATORY_CARE_PROVIDER_SITE_OTHER): Payer: Self-pay | Admitting: Dermatology

## 2022-02-24 DIAGNOSIS — L988 Other specified disorders of the skin and subcutaneous tissue: Secondary | ICD-10-CM

## 2022-02-24 NOTE — Progress Notes (Signed)
   Follow-Up Visit   Subjective  Lori [name] is a 46 y.o. female who presents for the following: Facial Elastosis (Patient here for botox follow up).  The following portions of the chart were reviewed this encounter and updated as appropriate:  Tobacco  Allergies  Meds  Problems  Med Hx  Surg Hx  Fam Hx      Review of Systems: No other skin or systemic complaints except as noted in HPI or Assessment and Plan.   Objective  Well appearing patient in no apparent distress; mood and affect are within normal limits.  A focused examination was performed including face. Relevant physical exam findings are noted in the Assessment and Plan.  face Rhytides and volume loss.                   Assessment & Plan  Elastosis of skin face  26 units total of botox in forehead , glabella, crows feet , brow complex    Recommended non-comedogenic (non-acne causing) facial oils include 100% argan oil or squalane. The can be used after applying any recommended creams or ointments to the skin in the evening. The Ordinary Brand has a high-quality and affordable version of both of these and can be found at [place].   Will prescribe Skin Medicinals Anti-Aging Tretinoin  0.0125%/Niacinamide/Vitamin C/Vitamin E/Turmeric/Resveratrol with Hyaluronic Acid. Apply pea sized amount nightly to the entire face.  The patient was advised this is not covered by insurance since it is made by a compounding pharmacy. They will receive an email to check out and the medication will be mailed to their home.   Topical retinoid medications like tretinoin can cause dryness and irritation when first started. Only apply a pea-sized amount to the entire affected area. Avoid applying it around the eyes, edges of mouth and creases at the nose. If you experience irritation, use a good moisturizer first and/or apply the medicine less often. If you are doing well with the medicine, you can increase how  often you use it until you are applying every night. Be careful with sun protection while using this medication as it can make you sensitive to the sun. This medicine should not be used by pregnant women.     Botox Injection - face Location:   Informed consent: Discussed risks (infection, pain, bleeding, bruising, swelling, allergic reaction, paralysis of nearby muscles, eyelid droop, double vision, neck weakness, difficulty breathing, headache, undesirable cosmetic result, and need for additional treatment) and benefits of the procedure, as well as the alternatives.  Informed consent was obtained.  Preparation: The area was cleansed with alcohol.  Procedure Details:  Botox was injected into the dermis with a 30-gauge needle. Pressure applied to any bleeding. Ice packs offered for swelling.  Lot Number:  [id number] Expiration:  04/2024  Total Units Injected:  26 units  Plan: Tylenol may be used for headache.  Allow 2 weeks before returning to clinic for additional dosing as needed. Patient will call for any problems.    Return for 2 week botox follow up. I, Ruthell Rummage, CMA, am acting as scribe for Forest Gleason, MD.  Documentation: I have reviewed the above documentation for accuracy and completeness, and I agree with the above.  Forest Gleason, MD

## 2022-02-24 NOTE — Patient Instructions (Addendum)
Send mychart message if you would increase strength of cream    Will prescribe Skin Medicinals Anti-Aging Tretinoin 0.0125%/Niacinamide/Vitamin C/Vitamin E/Turmeric/Resveratrol with Hyaluronic Acid. Apply pea sized amount nightly to the entire face.  The patient was advised this is not covered by insurance since it is made by a compounding pharmacy. They will receive an email to check out and the medication will be mailed to their home.   Topical retinoid medications like tretinoin can cause dryness and irritation when first started. Only apply a pea-sized amount to the entire affected area. Avoid applying it around the eyes, edges of mouth and creases at the nose. If you experience irritation, use a good moisturizer first and/or apply the medicine less often. If you are doing well with the medicine, you can increase how often you use it until you are applying every night. Be careful with sun protection while using this medication as it can make you sensitive to the sun. This medicine should not be used by pregnant women.   Recommended non-comedogenic (non-acne causing) facial oils include 100% argan oil or squalane. The can be used after applying any recommended creams or ointments to the skin in the evening. The Ordinary Brand has a high-quality and affordable version of both of these and can be found at [place].   Due to recent changes in healthcare laws, you may see results of your pathology and/or laboratory studies on MyChart before the doctors have had a chance to review them. We understand that in some cases there may be results that are confusing or concerning to you. Please understand that not all results are received at the same time and often the doctors may need to interpret multiple results in order to provide you with the best plan of care or course of treatment. Therefore, we ask that you please give [place] 2 business days to thoroughly review all your results before contacting the  office for clarification. Should we see a critical lab result, you will be contacted sooner.   If You Need Anything After Your Visit  If you have any questions or concerns for your doctor, please call our main line at [phone] and press option 4 to reach your doctor's medical assistant. If no one answers, please leave a voicemail as directed and we will return your call as soon as possible. Messages left after 4 pm will be answered the following business day.   You may also send [place] a message via [place]. We typically respond to MyChart messages within 1-2 business days.  For prescription refills, please ask your pharmacy to contact our office. Our fax number is [phone].  If you have an urgent issue when the clinic is closed that cannot wait until the next business day, you can page your doctor at the number below.    Please note that while we do our best to be available for urgent issues outside of office hours, we are not available 24/7.   If you have an urgent issue and are unable to reach [place], you may choose to seek medical care at your doctor's office, retail clinic, urgent care center, or emergency room.  If you have a medical emergency, please immediately call 911 or go to the emergency department.  Pager Numbers  - Dr. Nehemiah Massed: [phone]  - Dr. Laurence Ferrari: [phone]  - Dr. Nicole Kindred: [phone]  In the event of inclement weather, please call our main line at [phone] for an update on the status of any delays or  closures.  Dermatology Medication Tips: Please keep the boxes that topical medications come in in order to help keep track of the instructions about where and how to use these. Pharmacies typically print the medication instructions only on the boxes and not directly on the medication tubes.   If your medication is too expensive, please contact our office at [phone] option 4 or send [place] a message through [place].   We are unable to tell what your co-pay  for medications will be in advance as this is different depending on your insurance coverage. However, we may be able to find a substitute medication at lower cost or fill out paperwork to get insurance to cover a needed medication.   If a prior authorization is required to get your medication covered by your insurance company, please allow [place] 1-2 business days to complete this process.  Drug prices often vary depending on where the prescription is filled and some pharmacies may offer cheaper prices.  The website www.goodrx.com contains coupons for medications through different pharmacies. The prices here do not account for what the cost may be with help from insurance (it may be cheaper with your insurance), but the website can give you the price if you did not use any insurance.  - You can print the associated coupon and take it with your prescription to the pharmacy.  - You may also stop by our office during regular business hours and pick up a GoodRx coupon card.  - If you need your prescription sent electronically to a different pharmacy, notify our office through [facility] or by phone at [phone] option 4.     Si Usted Necesita Algo Despus de Su Visita  Tambin puede enviarnos un mensaje a travs de Pharmacist, community. Por lo general respondemos a los mensajes de MyChart en el transcurso de 1 a 2 das hbiles.  Para renovar recetas, por favor pida a su farmacia que se ponga en contacto con nuestra oficina. Harland Dingwall de fax es [place] [phone].  Si tiene un asunto urgente cuando la clnica est cerrada y que no puede esperar hasta el siguiente da hbil, puede llamar/localizar a su doctor(a) al nmero que aparece a continuacin.   Por favor, tenga en cuenta que aunque hacemos todo lo posible para estar disponibles para asuntos urgentes fuera del horario de [place], no estamos disponibles las 24 horas del da, los 7 das de la [place].   Si tiene un problema urgente y no puede  comunicarse con nosotros, puede optar por buscar atencin mdica  en el consultorio de su doctor(a), en una clnica privada, en un centro de atencin urgente o en una sala de emergencias.  Si tiene Engineering geologist, por favor llame inmediatamente al 911 o vaya a la sala de emergencias.  Nmeros de bper  - Dr. Nehemiah Massed: [phone]  - Dra. Moye: [phone]  - Dra. Nicole Kindred: [phone]  En caso de inclemencias del [place], por favor llame a [place]ie Kindred principal al [phone] para una actualizacin sobre el [place] de cualquier retraso o cierre.  Consejos para la medicacin en dermatologa: Por favor, guarde las cajas en las que vienen los medicamentos de uso tpico para ayudarle a seguir las instrucciones sobre dnde y cmo usarlos. Las farmacias generalmente imprimen las instrucciones del medicamento slo en las cajas y no directamente en los tubos del [place].   Si su medicamento es muy caro, por favor, pngase en contacto con Zigmund Daniel llamando al [phone] y presione la opcin 4 o  envenos un mensaje a travs de MyChart.   No podemos decirle cul ser su copago por los medicamentos por adelantado ya que esto es diferente dependiendo de la cobertura de su seguro. Sin embargo, es posible que podamos encontrar un medicamento sustituto a Electrical engineer un formulario para que el seguro cubra el medicamento que se considera necesario.   Si se requiere una autorizacin previa para que su compaa de seguros [place] su medicamento, por favor permtanos de 1 a 2 das hbiles para completar este proceso.  Los precios de los medicamentos varan con frecuencia dependiendo del Environmental consultant de dnde se surte la receta y alguna farmacias pueden ofrecer precios ms baratos.  El sitio web www.goodrx.com tiene cupones para medicamentos de Airline pilot. Los precios aqu no tienen en cuenta lo que podra costar con la ayuda del seguro (puede ser ms barato con su seguro), pero  el sitio web puede darle el precio si no utiliz Research scientist (physical sciences).  - Puede imprimir el cupn correspondiente y llevarlo con su receta a la farmacia.  - Tambin puede pasar por nuestra oficina durante el horario de atencin regular y Charity fundraiser una tarjeta de cupones de GoodRx.  - Si necesita que su receta se enve electrnicamente a una farmacia diferente, informe a nuestra oficina a travs de MyChart de [place] o por telfono llamando al [phone] y presione la opcin 4.

## 2022-02-27 ENCOUNTER — Encounter: Payer: Self-pay | Admitting: Dermatology

## 2022-03-11 ENCOUNTER — Ambulatory Visit: Payer: BC Managed Care – PPO | Admitting: Dermatology

## 2022-03-20 ENCOUNTER — Other Ambulatory Visit: Payer: Self-pay | Admitting: Family Medicine

## 2022-03-22 NOTE — Telephone Encounter (Signed)
Requested medication (s) are due for refill today- expired Rx  Requested medication (s) are on the active medication list -yes  Future visit scheduled -no  Last refill: 03/02/21 #90 3RF  Notes to clinic: expired Rx  Requested Prescriptions  Pending Prescriptions Disp Refills   pantoprazole (PROTONIX) 40 MG tablet [Pharmacy Med Name: PANTOPRAZOLE SOD DR 40 MG TAB] 90 tablet 3    Sig: TAKE 1 TABLET BY MOUTH EVERY DAY     Gastroenterology: Proton Pump Inhibitors Passed - 03/20/2022  1:25 AM      Passed - Valid encounter within last 12 months    Recent Outpatient Visits           2 months ago [place], [place], PA-C   3 months ago History of pyelonephritis   [facility] Thedore Mins, [place], PA-C   4 months ago Urinary urgency   [place], [place], PA-C   5 months ago GAD (generalized anxiety disorder)   [facility] Thedore Mins, [place], PA-C   8 months ago Ischial pain, unspecified laterality   [place], MD       Future Appointments             In 3 months Stoioff, Ronda Fairly, MD [place]               Requested Prescriptions  Pending Prescriptions Disp Refills   pantoprazole (PROTONIX) 40 MG tablet [Pharmacy Med Name: PANTOPRAZOLE SOD DR 40 MG TAB] 90 tablet 3    Sig: TAKE 1 TABLET BY MOUTH EVERY DAY     Gastroenterology: Proton Pump Inhibitors Passed - 03/20/2022  1:25 AM      Passed - Valid encounter within last 12 months    Recent Outpatient Visits           2 months ago [place], [place], PA-C   3 months ago History of pyelonephritis   [facility] Thedore Mins, [place], PA-C   4 months ago Urinary urgency   [place], [place], PA-C   5 months ago GAD (generalized anxiety disorder)   [facility] Thedore Mins, [place], PA-C   8 months ago Ischial  pain, unspecified laterality   [facility] Birdie Sons, MD       Future Appointments             In 3 months Stoioff, Ronda Fairly, MD [place]

## 2022-03-26 ENCOUNTER — Encounter (HOSPITAL_COMMUNITY): Payer: Self-pay | Admitting: Urgent Care

## 2022-03-26 ENCOUNTER — Inpatient Hospital Stay
Admission: RE | Admit: 2022-03-26 | Discharge: 2022-03-26 | Disposition: A | Discharge status: Home / Self Care | Payer: BC Managed Care – PPO | Source: Ambulatory Visit

## 2022-03-26 NOTE — Pre-Procedure Instructions (Signed)
Was able to reach patient and she stated she had cancelled her surgery with Dr Posey Pronto scheduled for 04/06/22.

## 2022-03-30 ENCOUNTER — Telehealth: Payer: Self-pay

## 2022-03-30 NOTE — Telephone Encounter (Signed)
Pt needs to schedule an appt for an annual, called triage line. Please help schedule this appt for pt.

## 2022-03-31 ENCOUNTER — Other Ambulatory Visit: Payer: Self-pay | Admitting: Physician Assistant

## 2022-04-01 DIAGNOSIS — Z9221 Personal history of antineoplastic chemotherapy: Secondary | ICD-10-CM | POA: Diagnosis not present

## 2022-04-01 DIAGNOSIS — T66XXXD Radiation sickness, unspecified, subsequent encounter: Secondary | ICD-10-CM | POA: Diagnosis not present

## 2022-04-01 DIAGNOSIS — Z79890 Hormone replacement therapy (postmenopausal): Secondary | ICD-10-CM | POA: Diagnosis not present

## 2022-04-01 DIAGNOSIS — R92323 Mammographic fibroglandular density, bilateral breasts: Secondary | ICD-10-CM | POA: Diagnosis not present

## 2022-04-01 DIAGNOSIS — N390 Urinary tract infection, site not specified: Secondary | ICD-10-CM | POA: Diagnosis not present

## 2022-04-01 DIAGNOSIS — I427 Cardiomyopathy due to drug and external agent: Secondary | ICD-10-CM | POA: Diagnosis not present

## 2022-04-01 DIAGNOSIS — Z79899 Other long term (current) drug therapy: Secondary | ICD-10-CM | POA: Diagnosis not present

## 2022-04-01 DIAGNOSIS — Z1231 Encounter for screening mammogram for malignant neoplasm of breast: Secondary | ICD-10-CM | POA: Diagnosis not present

## 2022-04-01 DIAGNOSIS — Z7984 Long term (current) use of oral hypoglycemic drugs: Secondary | ICD-10-CM | POA: Diagnosis not present

## 2022-04-01 DIAGNOSIS — Z9081 Acquired absence of spleen: Secondary | ICD-10-CM | POA: Diagnosis not present

## 2022-04-01 DIAGNOSIS — T451X5D Adverse effect of antineoplastic and immunosuppressive drugs, subsequent encounter: Secondary | ICD-10-CM | POA: Diagnosis not present

## 2022-04-01 DIAGNOSIS — Z8719 Personal history of other diseases of the digestive system: Secondary | ICD-10-CM | POA: Diagnosis not present

## 2022-04-01 DIAGNOSIS — Z9049 Acquired absence of other specified parts of digestive tract: Secondary | ICD-10-CM | POA: Diagnosis not present

## 2022-04-01 DIAGNOSIS — T66XXXS Radiation sickness, unspecified, sequela: Secondary | ICD-10-CM | POA: Diagnosis not present

## 2022-04-01 DIAGNOSIS — R399 Unspecified symptoms and signs involving the genitourinary system: Secondary | ICD-10-CM | POA: Diagnosis not present

## 2022-04-01 DIAGNOSIS — Z791 Long term (current) use of non-steroidal anti-inflammatories (NSAID): Secondary | ICD-10-CM | POA: Diagnosis not present

## 2022-04-01 DIAGNOSIS — C8110 Nodular sclerosis classical Hodgkin lymphoma, unspecified site: Secondary | ICD-10-CM | POA: Diagnosis not present

## 2022-04-05 NOTE — Telephone Encounter (Signed)
Reached out to pt to schedule annual.  No answer.  Left message for pt to call back to schedule.

## 2022-04-06 ENCOUNTER — Encounter: Admission: RE | Payer: Self-pay | Source: Home / Self Care

## 2022-04-06 ENCOUNTER — Ambulatory Visit
Admission: RE | Admit: 2022-04-06 | Payer: BC Managed Care – PPO | Source: Home / Self Care | Admitting: Orthopedic Surgery

## 2022-04-06 SURGERY — ARTHROSCOPY HIP
Anesthesia: Choice | Site: Hip | Laterality: Right

## 2022-04-14 ENCOUNTER — Other Ambulatory Visit: Payer: Self-pay | Admitting: Neurology

## 2022-04-14 DIAGNOSIS — R519 Headache, unspecified: Secondary | ICD-10-CM

## 2022-05-06 ENCOUNTER — Ambulatory Visit
Admission: RE | Admit: 2022-05-06 | Discharge: 2022-05-06 | Disposition: A | Discharge status: Home / Self Care | Payer: BC Managed Care – PPO | Source: Ambulatory Visit | Attending: Neurology | Admitting: Neurology

## 2022-05-06 DIAGNOSIS — R519 Headache, unspecified: Secondary | ICD-10-CM

## 2022-05-06 MED ORDER — GADOBENATE DIMEGLUMINE 529 MG/ML IV SOLN
13.0000 mL | Freq: Once | INTRAVENOUS | Status: AC | PRN
  Administered 2022-05-06: 13 mL via INTRAVENOUS

## 2022-05-12 ENCOUNTER — Encounter: Payer: Self-pay | Admitting: Physician Assistant

## 2022-05-12 NOTE — Progress Notes (Deleted)
No chief complaint on file.    HPI:      [name] is a 46 y.o. [phone] who LMP was No LMP recorded. (Menstrual status: IUD)., presents today for her annual examination.  Her menses are absent with IUD. Pt had DUB in past with endometrial mass on u/s, s/p hysteroscopy/D&C/polypectomy and new IUD placement with Dr. Glennon Mac 10/18. Doing well. No BTB/dysmen. Has bad vasomotor sx.   Sex activity: single partner, contraception - IUD. Mirena placed 03/10/17. Has dyspareunia, improved with lubricants. Last Pap: 01/18/17 Results were: no abnormalities /neg HPV DNA  Hx of STDs: none  Last mammogram: 04/01/22 at [facility]; pt gets mammos/MR[facility] at [facility] yearly for hx of Breast mass/bx. There is no FH of breast cancer. There is no FH of ovarian cancer. The patient does do self-breast exams.  Tobacco use: The patient denies current or previous tobacco use. Alcohol use: social No drug use Exercise: mod active  Complains of SUI and urge incont, with post void dribbling. Drinks 2 caffeine drinks daily, has done some kegels.   Colonoscopy: 4/20 at [facility] with polyps; pt high risk for CRC due to hx of para-aortic/splenic pedicle radiation for Hodgkins lymphoma age 25. Not sure when colonoscopy due again.   She does get adequate calcium but not Vitamin D in her diet. Diagnosed with Vit D deficiency in past.  She has labs with PCP. Followed by oncology for hx of Hodgkins and cardiology and endocrinology at [facility].    Past Medical History:  Diagnosis Date   Bacterial vaginitis    CHF (congestive heart failure) (HCC)    Colon polyps    Complication of anesthesia    nausea/vomiting   Depression    Diabetes mellitus without complication ([place])    History of abnormal mammogram 2013   '@[facility]'$ - NEG MRI AND BX OCT 2014 NORMAL   History of mammogram 03/2015   BREAST MRI AT [facility] WNL   History of Papanicolaou smear of cervix 10/08/2013; 08/04/15   -/-; ASCUS/HPV NEG;   Hodgkin's disease ([place]) 1987   She had  surgical resection of Lymph nodes and chemo + rad tx's.   Hypertension    Hypothyroidism    2ND TO CA TX   PONV (postoperative nausea and vomiting)    Restrictive lung disease    Vitamin D deficiency     Past Surgical History:  Procedure Laterality Date   CHOLECYSTECTOMY     DEEP NECK LYMPH NODE BIOPSY / EXCISION  1987   hodgkin lymphoma   DILATATION & CURETTAGE/HYSTEROSCOPY WITH MYOSURE N/A 03/10/2017   Procedure: DILATATION & CURETTAGE/HYSTEROSCOPY WITH MYOSURE;  Surgeon: Will Bonnet, MD;  Location: [facility] ORS;  Service: Gynecology;  Laterality: N/A;   INTRAUTERINE DEVICE (IUD) INSERTION  [id number]; 02/06/09; 12/12/13   INTRAUTERINE DEVICE (IUD) INSERTION N/A 03/10/2017   Procedure: INTRAUTERINE DEVICE (IUD) INSERTION;  Surgeon: Will Bonnet, MD;  Location: [facility] ORS;  Service: Gynecology;  Laterality: N/A;   SPLENECTOMY     THYROIDECTOMY      Family History  Problem Relation Age of Onset   Hypertension Mother    Other Maternal Grandfather        bile duct cancer   Colon cancer Paternal Grandmother        69-70    Social History   Socioeconomic History   Marital status: Married    Spouse name: Not on file   Number of children: Not on file   Years of education: 12   Highest  education level: Not on file  Occupational History   Occupation: QUOTATION SPEC    Employer: [employer]  Tobacco Use   Smoking status: Former    Packs/day: 0.25    Years: 10.00    Total pack years: 2.50    Types: Cigarettes    Quit date: 03/10/2016    Years since quitting: 6.1   Smokeless tobacco: Never  Vaping Use   Vaping Use: Never used  Substance and Sexual Activity   Alcohol use: Yes    Alcohol/week: 2.0 standard drinks of alcohol    Types: 2 Glasses of wine per week    Comment: occasional   Drug use: No   Sexual activity: Yes    Birth control/protection: I.U.D.    Comment: Mirena  Other Topics Concern   Not on file  Social History Narrative   Not on file    Social Determinants of Health   Financial Resource Strain: Not on file  Food Insecurity: Not on file  Transportation Needs: Not on file  Physical Activity: Not on file  Stress: Not on file  Social Connections: Not on file  Intimate Partner Violence: Not on file     Current Outpatient Medications:    ALPRAZolam (XANAX) 0.5 MG tablet, TAKE 1 TABLET BY MOUTH AT BEDTIME AS NEEDED FOR ANXIETY AND 1/2 TAB DURING THE DAY FOR PANIC ATTACKS. DO NOT TAKE WITH MUSCLE RELAXANTS, Disp: 45 tablet, Rfl: 1   atorvastatin (LIPITOR) 40 MG tablet, Take 1 tablet by mouth daily., Disp: , Rfl:    Continuous Blood Gluc Sensor (DEXCOM G6 SENSOR) MISC, SMARTSIG:1 Each Topical Every 10 Days, Disp: 9 each, Rfl: 2   Continuous Blood Gluc Transmit (DEXCOM G6 TRANSMITTER) MISC, USE TO MONITOR BLOOD SUGARS. CHANGE EVERY 90 DAYS., Disp: , Rfl:    glimepiride (AMARYL) 2 MG tablet, Take 2 mg by mouth every morning., Disp: , Rfl:    levonorgestrel (MIRENA) 20 MCG/24HR IUD, 1 each by Intrauterine route once., Disp: , Rfl:    levothyroxine (SYNTHROID) 88 MCG tablet, Take 88 mcg by mouth daily., Disp: , Rfl:    lisinopril (PRINIVIL,ZESTRIL) 5 MG tablet, Take 5 mg by mouth at bedtime. , Disp: , Rfl:    metoprolol succinate (TOPROL-XL) 50 MG 24 hr tablet, TAKE 1 TABLET BY MOUTH EVERY DAY, Disp: 90 tablet, Rfl: 1   pantoprazole (PROTONIX) 40 MG tablet, TAKE 1 TABLET BY MOUTH EVERY DAY, Disp: 90 tablet, Rfl: 0   sulfamethoxazole-trimethoprim (BACTRIM DS) 800-160 MG tablet, Take 1 tablet by mouth 2 (two) times daily., Disp: 42 tablet, Rfl: 0   SYNJARDY XR 12.10-998 MG TB24, Take 2 tablets by mouth every morning., Disp: , Rfl:    venlafaxine XR (EFFEXOR XR) 75 MG 24 hr capsule, Take 1 capsule (75 mg total) by mouth daily with breakfast., Disp: 90 capsule, Rfl: 1  ROS:  Review of Systems  Constitutional:  Negative for fatigue, fever and unexpected weight change.  Respiratory:  Negative for cough, shortness of breath and  wheezing.   Cardiovascular:  Negative for chest pain, palpitations and leg swelling.  Gastrointestinal:  Negative for blood in stool, constipation, diarrhea, nausea and vomiting.  Endocrine: Negative for cold intolerance, heat intolerance and polyuria.  Genitourinary:  Positive for dyspareunia. Negative for dysuria, flank pain, frequency, genital sores, hematuria, menstrual problem, pelvic pain, urgency, vaginal bleeding and vaginal discharge.  Musculoskeletal:  Negative for back pain, joint swelling and myalgias.  Skin:  Negative for rash.  Neurological:  Negative for dizziness, syncope, light-headedness,  numbness and headaches.  Hematological:  Negative for adenopathy.  Psychiatric/Behavioral:  Negative for agitation, confusion, sleep disturbance and suicidal ideas. The patient is not nervous/anxious.      Objective: There were no vitals taken for this visit.   Physical Exam Constitutional:      Appearance: She is well-developed.  Genitourinary:     Vulva normal.     Right Labia: No rash, tenderness or lesions.    Left Labia: No tenderness, lesions or rash.    No vaginal discharge, erythema or tenderness.      Right Adnexa: not tender and no mass present.    Left Adnexa: not tender and no mass present.    No cervical motion tenderness, friability or polyp.     IUD strings visualized.     Uterus is not enlarged or tender.  Breasts:    Right: No mass, nipple discharge, skin change or tenderness.     Left: No mass, nipple discharge, skin change or tenderness.  Neck:     Thyroid: No thyromegaly.  Cardiovascular:     Rate and Rhythm: Normal rate and regular rhythm.     Heart sounds: Normal heart sounds. No murmur heard. Pulmonary:     Effort: Pulmonary effort is normal.     Breath sounds: Normal breath sounds.  Abdominal:     Palpations: Abdomen is soft.     Tenderness: There is no abdominal tenderness. There is no guarding or rebound.  Musculoskeletal:        General:  Normal range of motion.     Cervical back: Normal range of motion.  Lymphadenopathy:     Cervical: No cervical adenopathy.  Neurological:     General: No focal deficit present.     Mental Status: She is alert and oriented to person, place, and time.     Cranial Nerves: No cranial nerve deficit.  Skin:    General: Skin is warm and dry.  Psychiatric:        Mood and Affect: Mood normal.        Behavior: Behavior normal.        Thought Content: Thought content normal.        Judgment: Judgment normal.  Vitals reviewed.     Assessment/Plan: Encounter for annual routine gynecological examination  Encounter for routine checking of intrauterine contraceptive device (IUD)--IUD strings in place, due for rem 10/25.  Encounter for screening mammogram for malignant neoplasm of breast--[facility] sched at [facility].  Screening for colon cancer--pt with hx of polyps and increased risk of CRC. Pt to f/u with [facility] to see when due for repeat colonoscopy.   Perimenopausal vasomotor symptoms--add estroven after confirming no med interaction with pharmacist.   Mixed incontinence urge and stress--kegels, d/c caffeine, increase voiding frequency. Can do pelvic PT ref and add OAB meds, but pt not interested currently. F/u prn.               GYN counsel adequate intake of calcium and vitamin D     F/U  No follow-ups on file.  Avree Szczygiel B. Niley Helbig, PA-C 05/12/2022 8:41 PM

## 2022-05-13 ENCOUNTER — Ambulatory Visit (INDEPENDENT_AMBULATORY_CARE_PROVIDER_SITE_OTHER): Payer: BC Managed Care – PPO | Admitting: Physician Assistant

## 2022-05-13 ENCOUNTER — Encounter: Payer: Self-pay | Admitting: Physician Assistant

## 2022-05-13 ENCOUNTER — Ambulatory Visit: Payer: BC Managed Care – PPO | Admitting: Obstetrics and Gynecology

## 2022-05-13 VITALS — BP 87/66 | HR 97 | Wt 146.2 lb

## 2022-05-13 DIAGNOSIS — Z124 Encounter for screening for malignant neoplasm of cervix: Secondary | ICD-10-CM

## 2022-05-13 DIAGNOSIS — Z1211 Encounter for screening for malignant neoplasm of colon: Secondary | ICD-10-CM

## 2022-05-13 DIAGNOSIS — Z1231 Encounter for screening mammogram for malignant neoplasm of breast: Secondary | ICD-10-CM

## 2022-05-13 DIAGNOSIS — I959 Hypotension, unspecified: Secondary | ICD-10-CM

## 2022-05-13 DIAGNOSIS — S73191D Other sprain of right hip, subsequent encounter: Secondary | ICD-10-CM

## 2022-05-13 DIAGNOSIS — N3946 Mixed incontinence: Secondary | ICD-10-CM

## 2022-05-13 DIAGNOSIS — Z1151 Encounter for screening for human papillomavirus (HPV): Secondary | ICD-10-CM

## 2022-05-13 DIAGNOSIS — F320 Major depressive disorder, single episode, mild: Secondary | ICD-10-CM

## 2022-05-13 DIAGNOSIS — Z01419 Encounter for gynecological examination (general) (routine) without abnormal findings: Secondary | ICD-10-CM

## 2022-05-13 DIAGNOSIS — N951 Menopausal and female climacteric states: Secondary | ICD-10-CM

## 2022-05-13 DIAGNOSIS — Z30431 Encounter for routine checking of intrauterine contraceptive device: Secondary | ICD-10-CM

## 2022-05-13 MED ORDER — VENLAFAXINE HCL ER 37.5 MG PO CP24: 37.5000 mg | ORAL_CAPSULE | Freq: Every day | ORAL | 0 refills | Status: DC

## 2022-05-13 MED ORDER — SERTRALINE HCL 50 MG PO TABS: 50.0000 mg | ORAL_TABLET | Freq: Every day | ORAL | 1 refills | Status: DC

## 2022-05-13 NOTE — Assessment & Plan Note (Signed)
Gave pt the name of another orthopedic surgeron

## 2022-05-13 NOTE — Assessment & Plan Note (Signed)
Advised if effexor was the cause, likely would have started with headaches as soon as she started taking. I am okay with the switch-- rx effexor 37.5 mg to wean -- 37.5 mg daily x 2 weeks, then every other day x 2 weeks, then every third day. After 24 hours without medication, can start zoloft 50 mg.

## 2022-05-13 NOTE — Patient Instructions (Signed)
Hip orthopedist:  Grace Blight, MD

## 2022-05-13 NOTE — Assessment & Plan Note (Signed)
Pt is managed on lisinopril 5 mg d/t DM II and metoprolol 50 mg daily d/t CHF from radiation therapy. Her BP is consistently low, today especially.  I would cut lisinopril to 2.5 mg and monitor. Pt will get the opinion of her cardiologist.

## 2022-05-13 NOTE — Progress Notes (Signed)
I,Sha'taria Tyson,acting as a Education administrator for [employer], PA-C.,have documented all relevant documentation on the behalf of Lori Kirschner, PA-C,as directed by  Lori Kirschner, PA-C while in the presence of Lori Kirschner, PA-C.  Established patient visit   Patient: Lori [name]   DOB: 04-11-1976   46 y.o. Female  MRN: [id number] Visit Date: 05/13/2022  Today's healthcare provider: [name], PA-C   Cc. Headaches, right hip pain   Subjective    HPI  For the last 6 mo pt reports daily, every other day headaches. She has been following with a headache specialist and has tried several different medications. She saw headache as a SE from the effexor and would like to switch back to Zoloft.   She also reports ongoing right hip pain, saw an orthopedic specialist who said she has a labral tear and needs surgery. She would like a second opinion. Medications: Outpatient Medications Prior to Visit  Medication Sig   ALPRAZolam (XANAX) 0.5 MG tablet TAKE 1 TABLET BY MOUTH AT BEDTIME AS NEEDED FOR ANXIETY AND 1/2 TAB DURING THE DAY FOR PANIC ATTACKS. DO NOT TAKE WITH MUSCLE RELAXANTS   atorvastatin (LIPITOR) 40 MG tablet Take 1 tablet by mouth daily.   Continuous Blood Gluc Sensor (DEXCOM G6 SENSOR) MISC SMARTSIG:1 Each Topical Every 10 Days   Continuous Blood Gluc Transmit (DEXCOM G6 TRANSMITTER) MISC USE TO MONITOR BLOOD SUGARS. CHANGE EVERY 90 DAYS.   glimepiride (AMARYL) 2 MG tablet Take 2 mg by mouth every morning.   levonorgestrel (MIRENA) 20 MCG/24HR IUD 1 each by Intrauterine route once.   levothyroxine (SYNTHROID) 88 MCG tablet Take 88 mcg by mouth daily.   lisinopril (PRINIVIL,ZESTRIL) 5 MG tablet Take 5 mg by mouth at bedtime.    metoprolol succinate (TOPROL-XL) 50 MG 24 hr tablet TAKE 1 TABLET BY MOUTH EVERY DAY   pantoprazole (PROTONIX) 40 MG tablet TAKE 1 TABLET BY MOUTH EVERY DAY   sulfamethoxazole-trimethoprim (BACTRIM DS) 800-160 MG tablet Take 1 tablet by mouth 2  (two) times daily.   SYNJARDY XR 12.10-998 MG TB24 Take 2 tablets by mouth every morning.   [DISCONTINUED] venlafaxine XR (EFFEXOR XR) 75 MG 24 hr capsule Take 1 capsule (75 mg total) by mouth daily with breakfast.   No facility-administered medications prior to visit.    Review of Systems  Constitutional:  Negative for fatigue and fever.  Respiratory:  Negative for cough and shortness of breath.   Cardiovascular:  Negative for chest pain and leg swelling.  Gastrointestinal:  Negative for abdominal pain.  Neurological:  Positive for headaches. Negative for dizziness.      Objective    BP (!) 87/66 (BP Location: Right Arm, Patient Position: Sitting, Cuff Size: Normal)   Pulse 97   Wt 146 lb 3.2 oz (66.3 kg)   SpO2 100%   BMI 23.60 kg/m  BP Readings from Last 3 Encounters:  05/13/22 (!) 87/66  01/13/22 93/61  12/23/21 96/66      Physical Exam Vitals reviewed.  Constitutional:      Appearance: She is not ill-appearing.  HENT:     Head: Normocephalic.  Eyes:     Conjunctiva/sclera: Conjunctivae normal.  Cardiovascular:     Rate and Rhythm: Normal rate.  Pulmonary:     Effort: Pulmonary effort is normal. No respiratory distress.  Neurological:     General: No focal deficit present.     Mental Status: She is alert and oriented to person, place, and time.  Psychiatric:  Mood and Affect: Mood normal.        Behavior: Behavior normal.     No results found for any visits on 05/13/22.  Assessment & Plan     Problem List Items Addressed This Visit       Cardiovascular and Mediastinum   Hypotension    Pt is managed on lisinopril 5 mg d/t DM II and metoprolol 50 mg daily d/t CHF from radiation therapy. Her BP is consistently low, today especially.  I would cut lisinopril to 2.5 mg and monitor. Pt will get the opinion of her cardiologist.        Musculoskeletal and Integument   Tear of right acetabular labrum    Gave pt the name of another orthopedic  surgeron        Other   Depression, major, single episode, mild ([place]) - Primary    Advised if effexor was the cause, likely would have started with headaches as soon as she started taking. I am okay with the switch-- rx effexor 37.5 mg to wean -- 37.5 mg daily x 2 weeks, then every other day x 2 weeks, then every third day. After 24 hours without medication, can start zoloft 50 mg.         Relevant Medications   venlafaxine XR (EFFEXOR XR) 37.5 MG 24 hr capsule   sertraline (ZOLOFT) 50 MG tablet     Return in about 3 months (around 08/12/2022), or if symptoms worsen or fail to improve, for anxiety, depression.      I, Lori Kirschner, PA-C have reviewed all documentation for this visit. The documentation on  05/13/2022 for the exam, diagnosis, procedures, and orders are all accurate and complete.  Lori Kirschner, PA-C [facility] [address] [place], [place], [address] Office: [phone] Fax: [place]

## 2022-05-17 ENCOUNTER — Other Ambulatory Visit: Payer: Self-pay | Admitting: Physician Assistant

## 2022-05-17 DIAGNOSIS — E119 Type 2 diabetes mellitus without complications: Secondary | ICD-10-CM

## 2022-05-19 ENCOUNTER — Telehealth: Payer: Self-pay | Admitting: Orthopaedic Surgery

## 2022-05-19 NOTE — Telephone Encounter (Signed)
Patient states she had her medical records transferred to this location and wants to know if they came in so she can make an appt. With [facility] [employer]. Please Advise..[phone]

## 2022-05-19 NOTE — Telephone Encounter (Signed)
IC, spoke with patient advised records received from [facility] clinic

## 2022-05-26 ENCOUNTER — Ambulatory Visit (INDEPENDENT_AMBULATORY_CARE_PROVIDER_SITE_OTHER): Payer: BC Managed Care – PPO | Admitting: Orthopaedic Surgery

## 2022-05-26 ENCOUNTER — Ambulatory Visit (INDEPENDENT_AMBULATORY_CARE_PROVIDER_SITE_OTHER): Payer: BC Managed Care – PPO

## 2022-05-26 DIAGNOSIS — M25551 Pain in right hip: Secondary | ICD-10-CM

## 2022-05-26 DIAGNOSIS — G8929 Other chronic pain: Secondary | ICD-10-CM

## 2022-05-26 DIAGNOSIS — M545 Low back pain, unspecified: Secondary | ICD-10-CM

## 2022-05-26 NOTE — Progress Notes (Addendum)
Office Visit Note   Patient: Lori [name]           Date of Birth: 03-11-1976           MRN: [id number] Visit Date: 05/26/2022              Requested by: [name], PA-C [address] [place],  [place] [address] PCP: [name], PA-C   Assessment & Plan: Visit Diagnoses:  1. Pain in right hip   2. Chronic right-sided low back pain without sciatica     Plan: Impression is chronic right hip pain with underlying labral pathology seen on non-contrast MRI.  Patient does experience some symptoms consistent with lumbar pathology as well.  I would like to go ahead and order an MRI of the lumbar spine to rule out any structural abnormalities there before referral to Dr. Sammuel Hines for further discussion on hip arthroscopy.  We will either call her or send her a MyChart message with the results following the MRI of the lumbar spine.  She will call us with any concerns or questions.  Follow-Up Instructions: Return if symptoms worsen or fail to improve.   Orders:  Orders Placed This Encounter  Procedures   XR Lumbar Spine 2-3 Views   MR Lumbar Spine w/o contrast   No orders of the defined types were placed in this encounter.     Procedures: No procedures performed   Clinical Data: No additional findings.   Subjective: Chief Complaint  Patient presents with   Right Hip - Pain    HPI patient is a pleasant 46 year old female who comes in today for second opinion of the right hip.  She has been complaining of right hip pain for the past several years which has progressively worsened.  She has recently been seen by Dr. Catalina [facility] in [place] where a hip arthroscopy has been recommended.  The pain she has been having is primarily to the right lateral hip but does note radiation into the groin as well as the right buttock.  Symptoms are worse when she is sitting, lying down, going from seated to his standing position as well as with external rotation of the  hip.  She has been using heat and ice as well as over-the-counter medication without relief.  She has undergone right hip intra-articular cortisone injection which provided temporary relief.  She has been to physical therapy without relief.  No previous ESI.  No bowel or bladder change or saddle paresthesias.  She does note occasional paresthesias into her buttock at times.  Of note, she did undergo MRI of the right hip which showed small tear in the labrum.  Review of Systems as detailed in HPI.  All others reviewed and are negative.   Objective: Vital Signs: There were no vitals taken for this visit.  Physical Exam well-developed well-nourished female no acute distress.  Alert and oriented x 3.  Ortho Exam right hip exam shows pain with Stinchfield.  No pain with logroll or FADIR testing.  Negative straight leg raise.  She has slight right lower back pain and buttock pain with lumbar flexion and extension.  No focal weakness.  She is neurovascular tact distally.  Specialty Comments:  No specialty comments available.  Imaging: No results found.   PMFS History: Patient Active Problem List   Diagnosis Date Noted   Hypotension 05/13/2022   History of pyelonephritis 12/11/2021   Night sweats 10/12/2021   Depression, major, single episode, mild ([place]) 10/12/2021  GAD (generalized anxiety disorder) 07/06/2021   Insomnia 07/06/2021   Tear of right acetabular labrum 07/06/2021   Mixed incontinence urge and stress 10/27/2020   Endometrial mass 01/18/2017   DUB (dysfunctional uterine bleeding) 01/18/2017   Bacterial vaginosis 01/18/2017   Allergic rhinitis 06/03/2015   Anxiety 15/17/6160   Acute systolic heart failure ([place]) 06/03/2015   Clinical depression 06/03/2015   Diabetes mellitus, type 2 ([place]) 06/03/2015   Acid reflux 06/03/2015   History of biliary T-tube placement 06/03/2015   HD (Hodgkin's disease) ([place]) 06/03/2015   RAD (reactive airway disease) 06/03/2015   Fast heart  beat 73/71/0626   Chronic systolic heart failure ([place]) 05/23/2014   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   Hypertension    Hypothyroidism    2ND TO CA TX   PONV (postoperative nausea and vomiting)    Restrictive lung disease    Vitamin D deficiency     Family History  Problem Relation Age of Onset   Hypertension Mother    Other Maternal Grandfather        bile duct cancer   Colon cancer Paternal Grandmother        32-70    Past Surgical History:  Procedure Laterality Date   CHOLECYSTECTOMY     DEEP NECK LYMPH NODE BIOPSY / EXCISION  1987   hodgkin lymphoma   DILATATION & CURETTAGE/HYSTEROSCOPY WITH MYOSURE N/A 03/10/2017   Procedure: DILATATION & CURETTAGE/HYSTEROSCOPY WITH MYOSURE;  Surgeon: Will Bonnet, MD;  Location: [facility] ORS;  Service: Gynecology;  Laterality: N/A;   INTRAUTERINE DEVICE (IUD) INSERTION  [id number]; 02/06/09; 12/12/13   INTRAUTERINE DEVICE (IUD) INSERTION N/A 03/10/2017   Procedure: INTRAUTERINE DEVICE (IUD) INSERTION;  Surgeon: Will Bonnet, MD;  Location: [facility] ORS;  Service: Gynecology;  Laterality: N/A;   SPLENECTOMY     THYROIDECTOMY     Social History   Occupational History   Occupation: Insurance risk surveyor    Employer: [employer]  Tobacco Use   Smoking status: Former    Packs/day: 0.25    Years: 10.00    Total pack years: 2.50     Types: Cigarettes    Quit date: 03/10/2016    Years since quitting: 6.3   Smokeless tobacco: Never  Vaping Use   Vaping Use: Never used  Substance and Sexual Activity   Alcohol use: Yes    Alcohol/week: 2.0 standard drinks of alcohol    Types: 2 Glasses of wine per week    Comment: occasional   Drug use: No   Sexual activity: Yes    Birth control/protection: I.U.D.    Comment: Mirena

## 2022-06-02 ENCOUNTER — Encounter: Payer: Self-pay | Admitting: Orthopaedic Surgery

## 2022-06-11 ENCOUNTER — Other Ambulatory Visit: Payer: Self-pay | Admitting: Physician Assistant

## 2022-06-11 DIAGNOSIS — F320 Major depressive disorder, single episode, mild: Secondary | ICD-10-CM

## 2022-06-11 NOTE — Telephone Encounter (Signed)
Requested medications are due for refill today.  Unsure  Requested medications are on the active medications list.  yes  Last refill. 05/13/2022 #60 0 rf  Future visit scheduled.   no  Notes to clinic.  Pt is on tapering dose. Please review for refill.    Requested Prescriptions  Pending Prescriptions Disp Refills   venlafaxine XR (EFFEXOR-XR) 37.5 MG 24 hr capsule [Pharmacy Med Name: VENLAFAXINE HCL ER 37.5 MG CAP] 90 capsule 1    Sig: TAKE 1 CAPSULE BY MOUTH DAILY WITH BREAKFAST. FOR TWO WEEKS AND THEN EVERY OTHER DAY FOR TWO WEEKS, THEN EVERY THIRD DAY TO EVENTUALLY DISCONTINUE     Psychiatry: Antidepressants - SNRI - desvenlafaxine & venlafaxine Failed - 06/11/2022  1:33 PM      Failed - Last BP in normal range    BP Readings from Last 1 Encounters:  05/13/22 (!) 87/66         Failed - Lipid Panel in normal range within the last 12 months    No results found for: "CHOL", "POCCHOL", "CHOLTOT" No results found for: "[place]", "LDLC", "HIRISKLDL", "POCLDL", "LDLDIRECT", "REALLDLC", "TOTLDLC" No results found for: "HDL", "POCHDL" No results found for: "TRIG", "POCTRIG"       Passed - Cr in normal range and within 360 days    Creatinine  Date Value Ref Range Status  04/14/2014 0.61 0.60 - 1.30 mg/dL Final   Creatinine, Ser  Date Value Ref Range Status  12/23/2021 0.62 0.57 - 1.00 mg/dL Final         Passed - Completed PHQ-2 or PHQ-9 in the last 360 days      Passed - Valid encounter within last 6 months    Recent Outpatient Visits           4 weeks ago Depression, major, single episode, mild ([name])   [employer], [place], PA-C   5 months ago Doyle Ostwalt, [place], PA-C   6 months ago History of pyelonephritis   [employer], [place], PA-C   7 months ago Urinary urgency   Calvin Crump, [place], PA-C   8 months ago GAD (generalized anxiety disorder)   [facility] [name], PA-C       Future Appointments             In 1 month Stoioff, Ronda Fairly, MD [place]

## 2022-06-16 ENCOUNTER — Encounter: Payer: Self-pay | Admitting: Physician Assistant

## 2022-06-18 ENCOUNTER — Other Ambulatory Visit: Payer: Self-pay | Admitting: Physician Assistant

## 2022-06-18 MED ORDER — METHOCARBAMOL 750 MG PO TABS: 750.0000 mg | ORAL_TABLET | Freq: Three times a day (TID) | ORAL | 0 refills | Status: DC | PRN

## 2022-06-18 MED ORDER — TRAMADOL HCL 50 MG PO TABS: 50.0000 mg | ORAL_TABLET | Freq: Two times a day (BID) | ORAL | 2 refills | Status: DC | PRN

## 2022-06-18 NOTE — Telephone Encounter (Signed)
sent

## 2022-06-22 ENCOUNTER — Ambulatory Visit
Admission: RE | Admit: 2022-06-22 | Discharge: 2022-06-22 | Disposition: A | Discharge status: Home / Self Care | Payer: BC Managed Care – PPO | Source: Ambulatory Visit | Attending: Orthopaedic Surgery | Admitting: Orthopaedic Surgery

## 2022-06-22 DIAGNOSIS — M545 Low back pain, unspecified: Secondary | ICD-10-CM | POA: Diagnosis not present

## 2022-06-22 DIAGNOSIS — M25551 Pain in right hip: Secondary | ICD-10-CM

## 2022-06-30 ENCOUNTER — Ambulatory Visit (INDEPENDENT_AMBULATORY_CARE_PROVIDER_SITE_OTHER): Payer: BC Managed Care – PPO | Admitting: Orthopaedic Surgery

## 2022-06-30 DIAGNOSIS — S73191A Other sprain of right hip, initial encounter: Secondary | ICD-10-CM | POA: Diagnosis not present

## 2022-06-30 NOTE — Progress Notes (Signed)
Office Visit Note   Patient: Lori [name]           Date of Birth: [birth year]           MRN: [id number] Visit Date: 06/30/2022              Requested by: [name], PA-C [address] [place],   [id number] PCP: [name], PA-C   Assessment & Plan: Visit Diagnoses:  1. Tear of right acetabular labrum, initial encounter     Plan: MRI findings not consistent with patient's reported symptoms.  I do feel that the labral tear is the cause of her symptoms.  Based on these findings treatment options were discussed and she is interested in doing a surgical consultation with Dr. Sammuel Hines.  Referral has been placed.  Follow-up as needed.  Follow-Up Instructions: No follow-ups on file.   Orders:  No orders of the defined types were placed in this encounter.  No orders of the defined types were placed in this encounter.     Procedures: No procedures performed   Clinical Data: No additional findings.   Subjective: Chief Complaint  Patient presents with   Lower Back - Pain    HPI patient is a pleasant 47 year old female who comes in today to discuss MRI results of the lumbar spine.  She is here for second opinion a few weeks ago on chronic right hip pain.  She previously been seen by [facility] where an MRI of the hip showed a small labral tear.  Temporary relief with intra-articular hip injection.  She was complaining of pain only to the groin but primarily in the right buttock and lateral hip radiating to the knee.  Pain worse with lying down sitting to standing position.  She has tried physical therapy as well as NSAIDs without relief.  Muscle relaxers and tramadol provides some relief.  MRI of the lumbar spine shows mild degenerative changes L3-4 and L4-5 with mild bilateral neural foraminal narrowing L4-5  Review of Systems as detailed in HPI.  All others reviewed and negative.   Objective: Vital Signs: There were no vitals taken for this  visit.  Physical Exam well-developed well-nourished female no acute distress.  Alert and oriented x 2.  Ortho Exam right hip exam she has no pain with logroll and FADIR.  She has pain with deep hip flexion to the groin.  No pain with straight leg raise.  No pain to the greater trochanter.  She does have pain to the piriformis.  Pain with Stinchfield testing.  No focal weakness.  She is neurovascular intact distally.  Specialty Comments:  No specialty comments available.  Imaging: No new imaging   PMFS History: Patient Active Problem List   Diagnosis Date Noted   Hypotension 05/13/2022   History of pyelonephritis 12/11/2021   Night sweats 10/12/2021   Depression, major, single episode, mild ([place]) 10/12/2021   GAD (generalized anxiety disorder) 07/06/2021   Insomnia 07/06/2021   Tear of right acetabular labrum 07/06/2021   Mixed incontinence urge and stress 10/27/2020   Endometrial mass 01/18/2017   DUB (dysfunctional uterine bleeding) 01/18/2017   Bacterial vaginosis 01/18/2017   Allergic rhinitis 06/03/2015   Anxiety 37/62/8315   Acute systolic heart failure ([place]) 06/03/2015   Clinical depression 06/03/2015   Diabetes mellitus, type 2 ([place]) 06/03/2015   Acid reflux 06/03/2015   History of biliary T-tube placement 06/03/2015   HD (Hodgkin's disease) ([place]) 06/03/2015   RAD (reactive airway disease) 06/03/2015  Fast heart beat 49/67/5916   Chronic systolic heart failure ([place]) 05/23/2014   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 ([place]) 05/23/2014   Acquired hypothyroidism 04/05/2013   Past Medical History:  Diagnosis Date   Bacterial vaginitis    CHF (congestive heart failure) (H[facility])    Colon polyps    Complication of anesthesia    nausea/vomiting   Depression    Diabetes mellitus without complication ([place])    History of abnormal mammogram 2013   '@[facility]'$ - NEG MRI AND BX OCT 2014 NORMAL   History of mammogram 03/2015   BREAST MRI AT [facility] WNL   History of Papanicolaou  smear of cervix 10/08/2013; 08/04/15   -/-; ASCUS/HPV NEG;   Hodgkin's disease ([place]) 1987   She had surgical resection of Lymph nodes and chemo + rad tx's.   Hypertension    Hypothyroidism    2ND TO CA TX   PONV (postoperative nausea and vomiting)    Restrictive lung disease    Vitamin D deficiency     Family History  Problem Relation Age of Onset   Hypertension Mother    Other Maternal Grandfather        bile duct cancer   Colon cancer Paternal Grandmother        75-70    Past Surgical History:  Procedure Laterality Date   CHOLECYSTECTOMY     DEEP NECK LYMPH NODE BIOPSY / EXCISION  1987   hodgkin lymphoma   DILATATION & CURETTAGE/HYSTEROSCOPY WITH MYOSURE N/A 03/10/2017   Procedure: DILATATION & CURETTAGE/HYSTEROSCOPY WITH MYOSURE;  Surgeon: Will Bonnet, MD;  Location: [facility] ORS;  Service: Gynecology;  Laterality: N/A;   INTRAUTERINE DEVICE (IUD) INSERTION  [id number]; 02/06/09; 12/12/13   INTRAUTERINE DEVICE (IUD) INSERTION N/A 03/10/2017   Procedure: INTRAUTERINE DEVICE (IUD) INSERTION;  Surgeon: Will Bonnet, MD;  Location: [facility] ORS;  Service: Gynecology;  Laterality: N/A;   SPLENECTOMY     THYROIDECTOMY     Social History   Occupational History   Occupation: Insurance risk surveyor    Employer: [employer]  Tobacco Use   Smoking status: Former    Packs/day: 0.25    Years: 10.00    Total pack years: 2.50    Types: Cigarettes    Quit date: 03/10/2016    Years since quitting: 6.3   Smokeless tobacco: Never  Vaping Use   Vaping Use: Never used  Substance and Sexual Activity   Alcohol use: Yes    Alcohol/week: 2.0 standard drinks of alcohol    Types: 2 Glasses of wine per week    Comment: occasional   Drug use: No   Sexual activity: Yes    Birth control/protection: I.U.D.    Comment: Mirena

## 2022-07-09 ENCOUNTER — Ambulatory Visit (HOSPITAL_BASED_OUTPATIENT_CLINIC_OR_DEPARTMENT_OTHER): Payer: BC Managed Care – PPO | Admitting: Orthopaedic Surgery

## 2022-07-13 ENCOUNTER — Ambulatory Visit: Payer: BC Managed Care – PPO | Admitting: Obstetrics and Gynecology

## 2022-07-14 DIAGNOSIS — R200 Anesthesia of skin: Secondary | ICD-10-CM | POA: Diagnosis not present

## 2022-07-14 DIAGNOSIS — R202 Paresthesia of skin: Secondary | ICD-10-CM | POA: Diagnosis not present

## 2022-07-14 DIAGNOSIS — G5601 Carpal tunnel syndrome, right upper limb: Secondary | ICD-10-CM | POA: Diagnosis not present

## 2022-07-14 DIAGNOSIS — I152 Hypertension secondary to endocrine disorders: Secondary | ICD-10-CM | POA: Diagnosis not present

## 2022-07-14 DIAGNOSIS — E1159 Type 2 diabetes mellitus with other circulatory complications: Secondary | ICD-10-CM | POA: Diagnosis not present

## 2022-07-16 ENCOUNTER — Ambulatory Visit: Payer: BC Managed Care – PPO | Admitting: Urology

## 2022-07-19 DIAGNOSIS — R300 Dysuria: Secondary | ICD-10-CM | POA: Diagnosis not present

## 2022-07-21 ENCOUNTER — Ambulatory Visit (INDEPENDENT_AMBULATORY_CARE_PROVIDER_SITE_OTHER): Payer: BC Managed Care – PPO | Admitting: Orthopaedic Surgery

## 2022-07-21 DIAGNOSIS — S73191A Other sprain of right hip, initial encounter: Secondary | ICD-10-CM

## 2022-07-21 DIAGNOSIS — S76011A Strain of muscle, fascia and tendon of right hip, initial encounter: Secondary | ICD-10-CM

## 2022-07-21 NOTE — Progress Notes (Signed)
Chief Complaint: Right hip pain     History of Present Illness:    Lori [name] is a 47 y.o. female presents today with ongoing right hip pain which has been bothersome to her since 2019.  At this point she states that the hip pain has been progressing and she is experiencing this predominantly about the lateral aspect of the hip.  This is now much more bothersome and is bothering her even while she is sitting down.  She does have small grandchildren which she enjoys being active with although she feels like her hip is limiting her quite significantly.  She has previously had a femoral acetabular injection but got no relief from this.  She did trial physical therapy as well which she worked on range of motion but this did not improve her hip.  She is here today after seeing my partner Dr. Erlinda Hong for further discussion    Surgical History:   None  PMH/PSH/Family History/Social History/Meds/Allergies:    Past Medical History:  Diagnosis Date   Bacterial vaginitis    CHF (congestive heart failure) ([place])    Colon polyps    Complication of anesthesia    nausea/vomiting   Depression    Diabetes mellitus without complication ([place])    History of abnormal mammogram 2013   [facility] - NEG MRI AND BX OCT 2014 NORMAL   History of mammogram 03/2015   BREAST MRI AT [facility] WNL   History of Papanicolaou smear of cervix 10/08/2013; 08/04/15   -/-; ASCUS/HPV NEG;   Hodgkin's disease ([place]) 1987   She had surgical resection of Lymph nodes and chemo + rad tx's.   Hypertension    Hypothyroidism    2ND TO CA TX   PONV (postoperative nausea and vomiting)    Restrictive lung disease    Vitamin D deficiency    Past Surgical History:  Procedure Laterality Date   CHOLECYSTECTOMY     DEEP NECK LYMPH NODE BIOPSY / EXCISION  1987   hodgkin lymphoma   DILATATION & CURETTAGE/HYSTEROSCOPY WITH MYOSURE N/A 03/10/2017   Procedure: DILATATION & CURETTAGE/HYSTEROSCOPY WITH MYOSURE;   Surgeon: Will Bonnet, MD;  Location: [facility];  Service: Gynecology;  Laterality: N/A;   INTRAUTERINE DEVICE (IUD) INSERTION  [id number]; 02/06/09; 12/12/13   INTRAUTERINE DEVICE (IUD) INSERTION N/A 03/10/2017   Procedure: INTRAUTERINE DEVICE (IUD) INSERTION;  Surgeon: Will Bonnet, MD;  Location: [facility] OR[facility];  Service: Gynecology;  Laterality: N/A;   SPLENECTOMY     THYROIDECTOMY     Social History   Socioeconomic History   Marital status: Married    Spouse name: Not on file   Number of children: Not on file   Years of education: 12   Highest education level: Not on file  Occupational History   Occupation: QUOTATION [employer]    Employer: [employer]  Tobacco Use   Smoking status: Former    Packs/day: 0.25    Years: 10.00    Total pack years: 2.50    Types: Cigarettes    Quit date: 03/10/2016    Years since quitting: 6.3   Smokeless tobacco: Never  Vaping Use   Vaping Use: Never used  Substance and Sexual Activity   Alcohol use: Yes    Alcohol/week: 2.0 standard drinks of alcohol    Types: 2 Glasses of  wine per week    Comment: occasional   Drug use: No   Sexual activity: Yes    Birth control/protection: I.U.D.    Comment: Mirena  Other Topics Concern   Not on file  Social History Narrative   Not on file   Social Determinants of Health   Financial Resource Strain: Not on file  Food Insecurity: Not on file  Transportation Needs: Not on file  Physical Activity: Not on file  Stress: Not on file  Social Connections: Not on file   Family History  Problem Relation Age of Onset   Hypertension Mother    Other Maternal Grandfather        bile duct cancer   Colon cancer Paternal Grandmother        23-70   Allergies  Allergen Reactions   Dapagliflozin Other (See Comments)    (Farxiga) UTI   Liraglutide Nausea Only and Nausea And Vomiting    Victoza Victoza   Current Outpatient Medications  Medication Sig Dispense Refill   methocarbamol (ROBAXIN-750)  750 MG tablet Take 1 tablet (750 mg total) by mouth 3 (three) times daily as needed for muscle spasms. 30 tablet 0   traMADol (ULTRAM) 50 MG tablet Take 1 tablet (50 mg total) by mouth every 12 (twelve) hours as needed. 30 tablet 2   ALPRAZolam (XANAX) 0.5 MG tablet TAKE 1 TABLET BY MOUTH AT BEDTIME AS NEEDED FOR ANXIETY AND 1/2 TAB DURING THE DAY FOR PANIC ATTACKS. DO NOT TAKE WITH MUSCLE RELAXANTS 45 tablet 1   atorvastatin (LIPITOR) 40 MG tablet Take 1 tablet by mouth daily.     Continuous Blood Gluc Sensor (DEXCOM G6 SENSOR) MISC SMARTSIG:1 EACH TOPICAL EVERY 10 DAYS 9 each 2   Continuous Blood Gluc Transmit (DEXCOM G6 TRANSMITTER) MISC USE TO MONITOR BLOOD SUGARS. CHANGE EVERY 90 DAYS.     glimepiride (AMARYL) 2 MG tablet Take 2 mg by mouth every morning.     levonorgestrel (MIRENA) 20 MCG/24HR IUD 1 each by Intrauterine route once.     levothyroxine (SYNTHROID) 88 MCG tablet Take 88 mcg by mouth daily.     lisinopril (PRINIVIL,ZESTRIL) 5 MG tablet Take 5 mg by mouth at bedtime.      metoprolol succinate (TOPROL-XL) 50 MG 24 hr tablet TAKE 1 TABLET BY MOUTH EVERY DAY 90 tablet 1   pantoprazole (PROTONIX) 40 MG tablet TAKE 1 TABLET BY MOUTH EVERY DAY 90 tablet 0   sertraline (ZOLOFT) 50 MG tablet Take 1 tablet (50 mg total) by mouth daily. 90 tablet 1   sulfamethoxazole-trimethoprim (BACTRIM DS) 800-160 MG tablet Take 1 tablet by mouth 2 (two) times daily. 42 tablet 0   SYNJARDY XR 12.10-998 MG TB24 Take 2 tablets by mouth every morning.     venlafaxine XR (EFFEXOR XR) 37.5 MG 24 hr capsule Take 1 capsule (37.5 mg total) by mouth daily with breakfast. For two weeks and then every other day for two weeks, then every third day to eventually discontinue 60 capsule 0   No current facility-administered medications for this visit.   No results found.  Review of Systems:   A ROS was performed including pertinent positives and negatives as documented in the HPI.  Physical Exam :    Constitutional: NAD and appears stated age Neurological: Alert and oriented Psych: Appropriate affect and cooperative There were no vitals taken for this visit.   Comprehensive Musculoskeletal Exam:    Inspection Right Left  Skin No atrophy or gross abnormalities appreciated No  atrophy or gross abnormalities appreciated  Palpation    Tenderness Greater trochanter, gluteus medius None  Crepitus None None  Range of Motion    Flexion (passive) 120 120  Extension 30 30  IR 40, mild pain 45  ER 50 50  Strength    Flexion  5/5 5/5  Extension 5/5 5/5  Special Tests    FABER Negative Negative  FADIR Equivocally positive Negative  ER Lag/Capsular Insufficiency Negative Negative  Instability Negative Negative  Sacroiliac pain Negative  Negative   Instability    Generalized Laxity No No  Neurologic    sciatic, femoral, obturator nerves intact to light sensation  Vascular/Lymphatic    DP pulse 2+ 2+  Lumbar Exam    Patient has symmetric lumbar range of motion with negative pain referral to hip   Weakness with resisted abduction of the right hip  Imaging:   Xray (3 views right hip): Normal  MRI (right hip): There is a hyportrophic and partially torn labrum as well as insertional undersurface tearing of the gluteus medius  I personally reviewed and interpreted the radiographs.   Assessment:   47 y.o. female with right lateral based hip pain.  I did describe that I do believe her hip pain is somewhat complex.  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  Given the fact that she did not have any relief from the previous femoral acetabular injection I do believe that targeted injections with ultrasound will be needed to further ascertain her true underlying pain generator.  Today's visit I had offered her a gluteus medius ultrasound-guided injection which she said she did  experience immediate relief.  To get her into physical therapy for good strengthening program I will plan to reassess her in 6 weeks.  I have also asked her to send me a MyChart message with how she feels for ultrasound-guided injection of the boot.  Plan :    -Return to clinic 6 weeks     I personally saw and evaluated the patient, and participated in the management and treatment plan.  Vanetta Mulders, MD Attending Physician, Orthopedic Surgery  This document was dictated using Dragon voice recognition software. A reasonable attempt at proof reading has been made to minimize errors.

## 2022-07-22 ENCOUNTER — Encounter: Payer: Self-pay | Admitting: Physician Assistant

## 2022-07-22 ENCOUNTER — Ambulatory Visit: Payer: BC Managed Care – PPO | Admitting: Physician Assistant

## 2022-07-22 VITALS — BP 110/72 | HR 74 | Temp 97.6°F | Ht 66.0 in | Wt 146.0 lb

## 2022-07-22 DIAGNOSIS — R809 Proteinuria, unspecified: Secondary | ICD-10-CM | POA: Diagnosis not present

## 2022-07-22 DIAGNOSIS — N3001 Acute cystitis with hematuria: Secondary | ICD-10-CM | POA: Diagnosis not present

## 2022-07-22 DIAGNOSIS — G2581 Restless legs syndrome: Secondary | ICD-10-CM

## 2022-07-22 DIAGNOSIS — R300 Dysuria: Secondary | ICD-10-CM | POA: Diagnosis not present

## 2022-07-22 LAB — POCT URINALYSIS DIPSTICK
Bilirubin, UA: NEGATIVE
Glucose, UA: POSITIVE — AB
Ketones, UA: NEGATIVE
Leukocytes, UA: NEGATIVE
Nitrite, UA: NEGATIVE
Protein, UA: POSITIVE — AB
Spec Grav, UA: 1.02 (ref 1.010–1.025)
Urobilinogen, UA: 0.2 E.U./dL
pH, UA: 7 (ref 5.0–8.0)

## 2022-07-22 NOTE — Progress Notes (Signed)
Established patient visit   Patient: Lori [name]   DOB: 25-Aug-1975   47 y.o. Female  MRN: [id number] Visit Date: 07/22/2022  Today's healthcare provider: [name], PA-C   Cc. Dysuria x 4 days, restless legs  Subjective    HPI Pt reports dysuria, vaginal discomfort x 4 days. Reports going to [place] clinic and had a negative urine culture but symptoms have persisted despite Azo. Pt has history of pyelonephritis with negative urine culture.   She reports restless legs at night, odd sensation where she feels she needs to move her legs constantly at night. On and off for months but more consistently for the last few weeks. Affecting her ability to sleep.   Medications: Outpatient Medications Prior to Visit  Medication Sig   ALPRAZolam (XANAX) 0.5 MG tablet TAKE 1 TABLET BY MOUTH AT BEDTIME AS NEEDED FOR ANXIETY AND 1/2 TAB DURING THE DAY FOR PANIC ATTACKS. DO NOT TAKE WITH MUSCLE RELAXANTS   atorvastatin (LIPITOR) 40 MG tablet Take 1 tablet by mouth daily.   Continuous Blood Gluc Sensor (DEXCOM G6 SENSOR) MISC SMARTSIG:1 EACH TOPICAL EVERY 10 DAYS   Continuous Blood Gluc Transmit (DEXCOM G6 TRANSMITTER) MISC USE TO MONITOR BLOOD SUGARS. CHANGE EVERY 90 DAYS.   glimepiride (AMARYL) 2 MG tablet Take 2 mg by mouth every morning.   levonorgestrel (MIRENA) 20 MCG/24HR IUD 1 each by Intrauterine route once.   levothyroxine (SYNTHROID) 88 MCG tablet Take 88 mcg by mouth daily.   lisinopril (PRINIVIL,ZESTRIL) 5 MG tablet Take 5 mg by mouth at bedtime.    methocarbamol (ROBAXIN-750) 750 MG tablet Take 1 tablet (750 mg total) by mouth 3 (three) times daily as needed for muscle spasms.   metoprolol succinate (TOPROL-XL) 50 MG 24 hr tablet TAKE 1 TABLET BY MOUTH EVERY DAY   pantoprazole (PROTONIX) 40 MG tablet TAKE 1 TABLET BY MOUTH EVERY DAY   sertraline (ZOLOFT) 50 MG tablet Take 1 tablet (50 mg total) by mouth daily.   sulfamethoxazole-trimethoprim (BACTRIM DS) 800-160 MG tablet  Take 1 tablet by mouth 2 (two) times daily.   SYNJARDY XR 12.10-998 MG TB24 Take 2 tablets by mouth every morning.   traMADol (ULTRAM) 50 MG tablet Take 1 tablet (50 mg total) by mouth every 12 (twelve) hours as needed.   [DISCONTINUED] venlafaxine XR (EFFEXOR XR) 37.5 MG 24 hr capsule Take 1 capsule (37.5 mg total) by mouth daily with breakfast. For two weeks and then every other day for two weeks, then every third day to eventually discontinue   No facility-administered medications prior to visit.    Review of Systems  Constitutional:  Negative for fatigue and fever.  Respiratory:  Negative for cough and shortness of breath.   Cardiovascular:  Negative for chest pain and leg swelling.  Gastrointestinal:  Negative for abdominal pain.  Genitourinary:  Positive for dysuria.  Neurological:  Negative for dizziness and headaches.      Objective    BP 110/72 (BP Location: Right Arm, Patient Position: Sitting, Cuff Size: Normal)   Pulse 74   Temp 97.6 F (36.4 C)   Ht 5' 6"$  (1.676 m)   Wt 146 lb (66.2 kg)   SpO2 98%   BMI 23.57 kg/m   Physical Exam Vitals reviewed.  Constitutional:      Appearance: She is not ill-appearing.  HENT:     Head: Normocephalic.  Eyes:     Conjunctiva/sclera: Conjunctivae normal.  Cardiovascular:     Rate and Rhythm: Normal  rate.  Pulmonary:     Effort: Pulmonary effort is normal. No respiratory distress.  Neurological:     General: No focal deficit present.     Mental Status: She is alert and oriented to person, place, and time.  Psychiatric:        Mood and Affect: Mood normal.        Behavior: Behavior normal.      Results for orders placed or performed in visit on 07/22/22  Hemoglobin A1c  Result Value Ref Range   Hemoglobin A1C 7.9   POCT Urinalysis Dipstick  Result Value Ref Range   Color, UA yellow    Clarity, UA clody    Glucose, UA Positive (A) Negative   Bilirubin, UA neg    Ketones, UA neg    Spec Grav, UA 1.020 1.010 -  1.025   Blood, UA trace    pH, UA 7.0 5.0 - 8.0   Protein, UA Positive (A) Negative   Urobilinogen, UA 0.2 0.2 or 1.0 E.U./dL   Nitrite, UA neg    Leukocytes, UA Negative Negative   Appearance     Odor      Assessment & Plan     Acute cysitis 2. Dysuria 3. microalbuminuria UA + nitrites and trace blood. + protein  Will send for culture, urine micro, and uacr repeat-- last check some microalbuminuria Will hold off on treating before results are back give recent negative culture but hesitant to not treat at all given her history of pyelo w/ neg urine culture  Will r/o yeast, BV  4. Restless legs Will check vit b12 and iron panel  If wnr can try ropinirole   Return if symptoms worsen or fail to improve.      I, [name], PA-C have reviewed all documentation for this visit. The documentation on  07/22/22  for the exam, diagnosis, procedures, and orders are all accurate and complete.  [name], PA-C [facility] [address] [place], [place], [address] Office: [phone] Fax: [place]

## 2022-07-23 LAB — VITAMIN B12: Vitamin B-12: 284 pg/mL (ref 232–1245)

## 2022-07-23 LAB — URINALYSIS, MICROSCOPIC ONLY
Bacteria, UA: NONE SEEN
Casts: NONE SEEN /lpf

## 2022-07-23 LAB — IRON,TIBC AND FERRITIN PANEL
Ferritin: 24 ng/mL (ref 15–150)
Iron Saturation: 12 % — ABNORMAL LOW (ref 15–55)
Iron: 55 ug/dL (ref 27–159)
Total Iron Binding Capacity: 470 ug/dL — ABNORMAL HIGH (ref 250–450)
UIBC: 415 ug/dL (ref 131–425)

## 2022-07-23 LAB — MICROALBUMIN / CREATININE URINE RATIO
Creatinine, Urine: 72.3 mg/dL
Microalb/Creat Ratio: 13 mg/g creat (ref 0–29)
Microalbumin, Urine: 9.6 ug/mL

## 2022-07-25 ENCOUNTER — Encounter: Payer: Self-pay | Admitting: Physician Assistant

## 2022-07-26 ENCOUNTER — Other Ambulatory Visit: Payer: Self-pay | Admitting: Physician Assistant

## 2022-07-26 MED ORDER — CEPHALEXIN 500 MG PO CAPS: 500.0000 mg | ORAL_CAPSULE | Freq: Two times a day (BID) | ORAL | 0 refills | Status: DC

## 2022-07-28 ENCOUNTER — Other Ambulatory Visit: Payer: Self-pay | Admitting: Physician Assistant

## 2022-07-28 DIAGNOSIS — N3001 Acute cystitis with hematuria: Secondary | ICD-10-CM

## 2022-07-28 LAB — URINE CULTURE

## 2022-07-28 MED ORDER — NITROFURANTOIN MONOHYD MACRO 100 MG PO CAPS: 100.0000 mg | ORAL_CAPSULE | Freq: Two times a day (BID) | ORAL | 0 refills | Status: AC

## 2022-08-03 DIAGNOSIS — I420 Dilated cardiomyopathy: Secondary | ICD-10-CM | POA: Diagnosis not present

## 2022-08-03 DIAGNOSIS — I427 Cardiomyopathy due to drug and external agent: Secondary | ICD-10-CM | POA: Diagnosis not present

## 2022-08-03 DIAGNOSIS — I5022 Chronic systolic (congestive) heart failure: Secondary | ICD-10-CM | POA: Diagnosis not present

## 2022-08-03 DIAGNOSIS — T451X5A Adverse effect of antineoplastic and immunosuppressive drugs, initial encounter: Secondary | ICD-10-CM | POA: Diagnosis not present

## 2022-08-03 DIAGNOSIS — Z79899 Other long term (current) drug therapy: Secondary | ICD-10-CM | POA: Diagnosis not present

## 2022-08-03 DIAGNOSIS — Z9221 Personal history of antineoplastic chemotherapy: Secondary | ICD-10-CM | POA: Diagnosis not present

## 2022-08-03 DIAGNOSIS — I5021 Acute systolic (congestive) heart failure: Secondary | ICD-10-CM | POA: Diagnosis not present

## 2022-08-03 DIAGNOSIS — I5020 Unspecified systolic (congestive) heart failure: Secondary | ICD-10-CM | POA: Diagnosis not present

## 2022-08-03 DIAGNOSIS — Z87891 Personal history of nicotine dependence: Secondary | ICD-10-CM | POA: Diagnosis not present

## 2022-08-03 DIAGNOSIS — E785 Hyperlipidemia, unspecified: Secondary | ICD-10-CM | POA: Diagnosis not present

## 2022-08-03 DIAGNOSIS — Z8571 Personal history of Hodgkin lymphoma: Secondary | ICD-10-CM | POA: Diagnosis not present

## 2022-08-04 ENCOUNTER — Telehealth: Payer: Self-pay | Admitting: Physical Therapy

## 2022-08-04 NOTE — Telephone Encounter (Signed)
Called pt to ask if she wanted to come in earlier for eval because of increased availability. Pt did not respond so left VM.

## 2022-08-06 ENCOUNTER — Ambulatory Visit (INDEPENDENT_AMBULATORY_CARE_PROVIDER_SITE_OTHER): Payer: BC Managed Care – PPO | Admitting: Physician Assistant

## 2022-08-06 VITALS — BP 118/79 | HR 84 | Ht 66.0 in | Wt 146.0 lb

## 2022-08-06 DIAGNOSIS — N3946 Mixed incontinence: Secondary | ICD-10-CM

## 2022-08-06 DIAGNOSIS — N3281 Overactive bladder: Secondary | ICD-10-CM | POA: Diagnosis not present

## 2022-08-06 DIAGNOSIS — R3129 Other microscopic hematuria: Secondary | ICD-10-CM

## 2022-08-06 LAB — MICROSCOPIC EXAMINATION: Epithelial Cells (non renal): >10 /hpf — AB (ref 0–10)

## 2022-08-06 LAB — URINALYSIS, COMPLETE
Bilirubin, UA: NEGATIVE
Leukocytes,UA: NEGATIVE
Nitrite, UA: NEGATIVE
Protein,UA: NEGATIVE
RBC, UA: NEGATIVE
Specific Gravity, UA: 1.015 (ref 1.005–1.030)
Urobilinogen, Ur: 0.2 mg/dL (ref 0.2–1.0)
pH, UA: 6 (ref 5.0–7.5)

## 2022-08-06 NOTE — Progress Notes (Signed)
08/06/2022 4:50 PM   [place] 08/12/1975 [id number]  CC: Chief Complaint  Patient presents with   Follow-up   HPI: Lori [name] is a 47 y.o. female with PMH hematuria in the setting of urinary infection who presents today for symptom recheck and repeat UA.   Today she reports she completed 6 weeks of prophylactic Bactrim per Dr. Bernardo Heater after her most recent clinic visit with him.  She has been doing well, but did see her PCP about 2 weeks ago for possible UTI and was treated with Cipro, which was then transitioned to [place].  Her urine culture finalized with low colony counts of ESBL E. coli.  She reports several months of urgency, frequency, urge incontinence, and stress incontinence.  She has an IUD in place, but believes she is perimenopausal due to hot flashes.  She has had 2 vaginal deliveries and wears 1-2 panty liners daily, which can be between damp and soaked.  Notably, she is on Synjardy for diabetes.  In-office UA today positive for 3+ glucose and trace ketones; urine microscopy with >10 epithelial cells/hpf, and moderate bacteria.   PMH: Past Medical History:  Diagnosis Date   Bacterial vaginitis    CHF (congestive heart failure) (HCC)    Colon polyps    Complication of anesthesia    nausea/vomiting   Depression    Diabetes mellitus without complication ([place])    History of abnormal mammogram 2013   '@[facility]'$ - NEG MRI AND BX OCT 2014 NORMAL   History of mammogram 03/2015   BREAST MRI AT [facility] WNL   History of Papanicolaou smear of cervix 10/08/2013; 08/04/15   -/-; ASCUS/HPV NEG;   Hodgkin's disease ([place]) 1987   She had surgical resection of Lymph nodes and chemo + rad tx's.   Hypertension    Hypothyroidism    2ND TO [place] TX   PONV (postoperative nausea and vomiting)    Restrictive lung disease    Vitamin D deficiency     Surgical History: Past Surgical History:  Procedure Laterality Date   CHOLECYSTECTOMY     DEEP NECK LYMPH NODE BIOPSY / EXCISION   1987   hodgkin lymphoma   DILATATION & CURETTAGE/HYSTEROSCOPY WITH MYOSURE N/A 03/10/2017   Procedure: DILATATION & CURETTAGE/HYSTEROSCOPY WITH MYOSURE;  Surgeon: Will Bonnet, MD;  Location: [facility];  Service: Gynecology;  Laterality: N/A;   INTRAUTERINE DEVICE (IUD) INSERTION  [id number]; 02/06/09; 12/12/13   INTRAUTERINE DEVICE (IUD) INSERTION N/A 03/10/2017   Procedure: INTRAUTERINE DEVICE (IUD) INSERTION;  Surgeon: Will Bonnet, MD;  Location: [facility];  Service: Gynecology;  Laterality: N/A;   SPLENECTOMY     THYROIDECTOMY      Home Medications:  Allergies as of 08/06/2022       Reactions   Dapagliflozin Other (See Comments)   [place]) UTI   Liraglutide Nausea Only, Nausea And Vomiting   Victoza Victoza        Medication List        Accurate as of August 06, 2022  4:50 PM. If you have any questions, ask your nurse or doctor.          ALPRAZolam 0.5 MG tablet Commonly known as: XANAX TAKE 1 TABLET BY MOUTH AT BEDTIME AS NEEDED FOR ANXIETY AND 1/2 TAB DURING THE DAY FOR PANIC ATTACKS. DO NOT TAKE WITH MUSCLE RELAXANTS   atorvastatin 40 MG tablet Commonly known as: LIPITOR Take 1 tablet by mouth daily.   atorvastatin 40 MG tablet Commonly known as: LIPITOR  Take by mouth.   Dexcom G6 Sensor Misc SMARTSIG:1 EACH TOPICAL EVERY 10 DAYS   Dexcom G6 Transmitter Misc USE TO MONITOR BLOOD SUGARS. CHANGE EVERY 90 DAYS.   diazepam 5 MG tablet Commonly known as: VALIUM Take 10 mg by mouth 2 (two) times daily.   glimepiride 2 MG tablet Commonly known as: AMARYL Take 2 mg by mouth every morning.   levonorgestrel 20 MCG/24HR IUD Commonly known as: MIRENA 1 each by Intrauterine route once.   levothyroxine 88 MCG tablet Commonly known as: SYNTHROID Take 88 mcg by mouth daily.   lisinopril 5 MG tablet Commonly known as: ZESTRIL Take 5 mg by mouth at bedtime.   lisinopril 5 MG tablet Commonly known as: ZESTRIL Take by mouth.   methocarbamol 750 MG  tablet Commonly known as: Robaxin-750 Take 1 tablet (750 mg total) by mouth 3 (three) times daily as needed for muscle spasms.   methocarbamol 750 MG tablet Commonly known as: ROBAXIN Take 1 tablet by mouth 3 (three) times daily.   metoprolol succinate 50 MG 24 hr tablet Commonly known as: TOPROL-XL TAKE 1 TABLET BY MOUTH EVERY DAY   pantoprazole 40 MG tablet Commonly known as: PROTONIX TAKE 1 TABLET BY MOUTH EVERY DAY   sertraline 50 MG tablet Commonly known as: ZOLOFT Take 1 tablet (50 mg total) by mouth daily.   Synjardy XR 12.10-998 MG Tb24 Generic drug: Empagliflozin-metFORMIN HCl ER Take 2 tablets by mouth every morning.   traMADol 50 MG tablet Commonly known as: ULTRAM Take 1 tablet (50 mg total) by mouth every 12 (twelve) hours as needed.        Allergies:  Allergies  Allergen Reactions   Dapagliflozin Other (See Comments)    (Farxiga) UTI   Liraglutide Nausea Only and Nausea And Vomiting    Victoza Victoza    Family History: Family History  Problem Relation Age of Onset   Hypertension Mother    Other Maternal Grandfather        bile duct cancer   Colon cancer Paternal Grandmother        68-70    Social History:   reports that she quit smoking about 6 years ago. Her smoking use included cigarettes. She has a 2.50 pack-year smoking history. She has never used smokeless tobacco. She reports current alcohol use of about 2.0 standard drinks of alcohol per week. She reports that she does not use drugs.  Physical Exam: BP 118/79   Pulse 84   Ht '5\' 6"'$  (1.676 m)   Wt 146 lb (66.2 kg)   BMI 23.57 kg/m   Constitutional:  Alert and oriented, no acute distress, nontoxic appearing HEENT: [place], AT Cardiovascular: No clubbing, cyanosis, or edema Respiratory: Normal respiratory effort, no increased work of breathing Skin: No rashes, bruises or suspicious lesions Neurologic: Grossly intact, no focal deficits, moving all 4 extremities Psychiatric: Normal mood  and affect  Laboratory Data: Results for orders placed or performed in visit on 08/06/22  Microscopic Examination   Urine  Result Value Ref Range   WBC, UA 0-5 0 - 5 /hpf   RBC, Urine 0-2 0 - 2 /hpf   Epithelial Cells (non renal) >10 (A) 0 - 10 /hpf   Bacteria, UA Moderate (A) None seen/Few  Urinalysis, Complete  Result Value Ref Range   Specific Gravity, UA 1.015 1.005 - 1.030   pH, UA 6.0 5.0 - 7.5   Color, UA Yellow Yellow   Appearance Ur Clear Clear   Leukocytes,UA Negative Negative  Protein,UA Negative Negative/Trace   Glucose, UA 3+ (A) Negative   Ketones, UA Trace (A) Negative   RBC, UA Negative Negative   Bilirubin, UA Negative Negative   Urobilinogen, Ur 0.2 0.2 - 1.0 mg/dL   Nitrite, UA Negative Negative   Microscopic Examination See below:    Assessment & Plan:   1. Microscopic hematuria None on UA today.  Recommend repeat UA in 1 year, at which point I do not think she will require further monitoring in the absence of microscopic hematuria outside of episodes of urinary infection.  We discussed warning signs of hematuria including new gross hematuria, flank pain, or dysuria. - Urinalysis, Complete  2. OAB (overactive bladder) OAB wet with mixed urge and stress incontinence.  We discussed that her Iva Boop is likely contributing to this.  We discussed various treatment options including pharmacotherapy with anticholinergics, beta 3 agonist, or topical vaginal estrogen cream, but so far she reports minimal bother with this and wishes to defer treatment at this time.  Will continue to monitor.  3. Mixed incontinence urge and stress We discussed that pharmacotherapy for OAB will not likely address her stress incontinence and treatment options for this include Kegel exercises or pelvic floor PT.  She wishes to defer this for now, but can refer her in the future if her symptoms become more bothersome.   Return in about 1 year (around 08/06/2023) for Annual f/u with symptom  recheck, UA.  Debroah Loop, PA-C  [facility] [address], [place], [place] [address] [phone]

## 2022-08-12 ENCOUNTER — Ambulatory Visit: Payer: BC Managed Care – PPO | Attending: Orthopaedic Surgery | Admitting: Physical Therapy

## 2022-08-12 DIAGNOSIS — S76011A Strain of muscle, fascia and tendon of right hip, initial encounter: Secondary | ICD-10-CM | POA: Insufficient documentation

## 2022-08-12 DIAGNOSIS — M25551 Pain in right hip: Secondary | ICD-10-CM | POA: Insufficient documentation

## 2022-08-12 DIAGNOSIS — R262 Difficulty in walking, not elsewhere classified: Secondary | ICD-10-CM | POA: Insufficient documentation

## 2022-08-12 DIAGNOSIS — Z794 Long term (current) use of insulin: Secondary | ICD-10-CM | POA: Diagnosis not present

## 2022-08-12 DIAGNOSIS — E1169 Type 2 diabetes mellitus with other specified complication: Secondary | ICD-10-CM | POA: Diagnosis not present

## 2022-08-12 DIAGNOSIS — E890 Postprocedural hypothyroidism: Secondary | ICD-10-CM | POA: Diagnosis not present

## 2022-08-12 DIAGNOSIS — E1159 Type 2 diabetes mellitus with other circulatory complications: Secondary | ICD-10-CM | POA: Diagnosis not present

## 2022-08-12 DIAGNOSIS — E1165 Type 2 diabetes mellitus with hyperglycemia: Secondary | ICD-10-CM | POA: Diagnosis not present

## 2022-08-12 NOTE — Therapy (Signed)
OUTPATIENT PHYSICAL THERAPY LOWER EXTREMITY EVALUATION   Patient Name: Lori [name] MRN: [id number] DOB:[birth year], 47 y.o., female Today's Date: 08/12/2022  END OF SESSION:  PT End of Session - 08/12/22 1304     Visit Number 1    Number of Visits 20    Date for PT Re-Evaluation 10/21/22    Authorization Type [employer] [id number] VL 30    Authorization - Visit Number 1    Authorization - Number of Visits 20    Progress Note Due on Visit 10    PT Start Time 1100    PT Stop Time 1145    PT Time Calculation (min) 45 min    Activity Tolerance Patient tolerated treatment well    Behavior During Therapy WFL for tasks assessed/performed             Past Medical History:  Diagnosis Date   Bacterial vaginitis    CHF (congestive heart failure) (HCC)    Colon polyps    Complication of anesthesia    nausea/vomiting   Depression    Diabetes mellitus without complication ([place])    History of abnormal mammogram 2013   '@[facility]'$ - NEG MRI AND BX OCT 2014 NORMAL   History of mammogram 03/2015   BREAST MRI AT [facility] WNL   History of Papanicolaou smear of cervix 10/08/2013; 08/04/15   -/-; ASCUS/HPV NEG;   Hodgkin's disease ([place]) 1987   She had surgical resection of Lymph nodes and chemo + rad tx's.   Hypertension    Hypothyroidism    2ND TO [place] TX   PONV (postoperative nausea and vomiting)    Restrictive lung disease    Vitamin D deficiency    Past Surgical History:  Procedure Laterality Date   CHOLECYSTECTOMY     DEEP NECK LYMPH NODE BIOPSY / EXCISION  1987   hodgkin lymphoma   DILATATION & CURETTAGE/HYSTEROSCOPY WITH MYOSURE N/A 03/10/2017   Procedure: DILATATION & CURETTAGE/HYSTEROSCOPY WITH MYOSURE;  Surgeon: Will Bonnet, MD;  Location: [facility];  Service: Gynecology;  Laterality: N/A;   INTRAUTERINE DEVICE (IUD) INSERTION  [id number]; 02/06/09; 12/12/13   INTRAUTERINE DEVICE (IUD) INSERTION N/A 03/10/2017   Procedure: INTRAUTERINE DEVICE (IUD) INSERTION;  Surgeon: Will Bonnet, MD;   Location: [facility];  Service: Gynecology;  Laterality: N/A;   SPLENECTOMY     THYROIDECTOMY     Patient Active Problem List   Diagnosis Date Noted   Hypotension 05/13/2022   History of pyelonephritis 12/11/2021   Night sweats 10/12/2021   Depression, major, single episode, mild ([place]) 10/12/2021   GAD (generalized anxiety disorder) 07/06/2021   Insomnia 07/06/2021   Tear of right acetabular labrum 07/06/2021   Mixed incontinence urge and stress 10/27/2020   Endometrial mass 01/18/2017   DUB (dysfunctional uterine bleeding) 01/18/2017   Bacterial vaginosis 01/18/2017   Allergic rhinitis 06/03/2015   Anxiety 123456   Acute systolic heart failure ([place]) 06/03/2015   Clinical depression 06/03/2015   Diabetes mellitus, type 2 ([place]) 06/03/2015   Acid reflux 06/03/2015   History of biliary T-tube placement 06/03/2015   HD (Hodgkin's disease) ([place]) 06/03/2015   RAD (reactive airway disease) 06/03/2015   Fast heart beat 123456   Chronic systolic heart failure ([place]) 05/23/2014   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 Vanetta Mulders   REFERRING DIAG: Right hip pain   THERAPY DIAG:  Pain in right hip  Difficulty in walking, not elsewhere classified  Rationale for Evaluation and Treatment: Rehabilitation  ONSET DATE: 2022   SUBJECTIVE:   SUBJECTIVE STATEMENT: See pertinent history   PERTINENT HISTORY: Pt reports that she has been dealing with right hip pain for the better part of four to five years. She has been seeing various orthopedists and receiving joint injections. She has recently seen Dr. Vanetta Mulders who gave her an injection which has helped somewhat  PAIN:  Are you having pain? Yes: NPRS scale: 6/10 Pain location: Right lateral hip  Pain description: Achy and pinching   Aggravating factors: Sitting with her right leg crossed over her left leg  and walking for a long period of time  Relieving factors: Iciing and joint injection    PRECAUTIONS: None  WEIGHT BEARING RESTRICTIONS: No  FALLS:  Has patient fallen in last 6 months? No  LIVING ENVIRONMENT: Lives with: lives with their spouse Lives in: House/apartment Stairs: No Has following equipment at home: None  OCCUPATION: Desk work sitting a lot   PLOF: Independent  PATIENT GOALS: She wants to feel less pain in right hip and develop home exercises   NEXT MD VISIT: March 27th, 2024   OBJECTIVE:   VITALS: BP 118/72 HR 72 SpO2 100   DIAGNOSTIC FINDINGS:  CLINICAL DATA:  Chronic, progressively worsening right hip pain.   EXAM: MR OF THE RIGHT HIP WITHOUT CONTRAST   TECHNIQUE: Multiplanar, multisequence MR imaging was performed. No intravenous contrast was administered.   COMPARISON:  Right hip x-rays dated May 16, 2018.   FINDINGS: Bones: There is no evidence of acute fracture, dislocation or avascular necrosis. No focal bone lesion. The visualized sacroiliac joints and symphysis pubis appear normal.   Articular cartilage and labrum   Articular cartilage: No focal chondral defect or subchondral signal abnormality identified.   Labrum: Bulbous and irregular appearance of the right anterior superior labrum with increased signal, consistent with partial tear (series 6, image 11). No paralabral abnormality.   Joint or bursal effusion   Joint effusion: No significant hip joint effusion.   Bursae: No focal periarticular fluid collection.   Muscles and tendons   Muscles and tendons: The visualized gluteus, hamstring and iliopsoas tendons appear normal. No muscle edema or atrophy.   Other findings   Miscellaneous: The visualized internal pelvic contents appear unremarkable. IUD within the uterus.   IMPRESSION: 1. Partial tear of the right anterior superior labrum.     Electronically Signed   By: Titus Dubin M.D.   On: 04/10/2021  10:54  /////////////////////////////////////////////////////////////////////////////////////////////////////////////////  CLINICAL DATA:  Pain in right hip M25.551 (ICD-10-CM).   EXAM: MRI LUMBAR SPINE WITHOUT CONTRAST   TECHNIQUE: Multiplanar, multisequence MR imaging of the lumbar spine was performed. No intravenous contrast was administered.   COMPARISON:  Radiographs May 26, 2022.   FINDINGS: Segmentation:  Standard.   Alignment:  Physiologic.   Vertebrae:  No fracture, evidence of discitis, or bone lesion.   Conus medullaris and cauda equina: Conus extends to the L1 level. Conus and cauda equina appear normal.   Paraspinal and other soft tissues: Negative.   Disc levels:   T12-L1: No spinal canal or neural foraminal stenosis.   L1-2: No spinal canal or neural foraminal stenosis.   L2-3: No spinal canal or neural foraminal stenosis.   L3-4: Shallow disc bulge and mild facet degenerative changes. No significant spinal canal or neural foraminal stenosis.   L4-5: Disc bulge and mild facet degenerative changes resulting mild bilateral neural foraminal  narrowing. No significant spinal canal stenosis.   L5-S1: No spinal canal or neural foraminal stenosis.   IMPRESSION: 1. Mild degenerative changes at L3-4 and L4-5. 2. Mild bilateral neural foraminal narrowing at L4-5. 3. No high-grade spinal canal or neural foraminal stenosis at any level.     Electronically Signed   By: Pedro Earls M.D.   On: 06/23/2022 15:38    PATIENT SURVEYS:  FOTO 72/100 with target of 79   COGNITION: Overall cognitive status: Within functional limits for tasks assessed     SENSATION: WFL  MUSCLE LENGTH: Hamstrings: Right 80 deg; Left 90 deg Thomas test: Negative bilateral   POSTURE: No Significant postural limitations  PALPATION: Right glute med TTP   LOWER EXTREMITY ROM:  Active ROM Right eval Left eval  Hip flexion    Hip extension    Hip  abduction    Hip adduction    Hip internal rotation 35 45  Hip external rotation 45 45  Knee flexion    Knee extension    Ankle dorsiflexion    Ankle plantarflexion    Ankle inversion    Ankle eversion     (Blank rows = not tested)       LOWER EXTREMITY MMT:  MMT Right eval Left eval  Hip flexion 5 5  Hip extension 4 4  Hip abduction 4-* 4  Hip adduction 4 4  Hip internal rotation    Hip external rotation    Knee flexion 5 5  Knee extension 5 5  Ankle dorsiflexion 5 5  Ankle plantarflexion    Ankle inversion    Ankle eversion     (Blank rows = not tested)  LOWER EXTREMITY SPECIAL TESTS:  Hip special tests: Saralyn Pilar (FABER) test: positive , Thomas test: negative, Ober's test: negative, Ely's test: negative, and Hip scouring test: negative Lumbar: Straight Leg Raise: Negative   FUNCTIONAL TESTS:  Squat: NT   GAIT: Not performed  Distance walked: NT  Assistive device utilized: None Level of assistance: Complete Independence Comments: Did not analyze gait    TODAY'S TREATMENT:                                                                                                                              DATE:   08/12/22:  Isometric RLE Hip Abduction with 10 sec hold in side lying x 10 reps  Standing Right Hip Abduction Against wall 5 x 30 sec    PATIENT EDUCATION:  Education details: form and technique for accurate performance of exercise and explanation about underlying pathology  Person educated: Patient Education method: Consulting civil engineer, Demonstration, Verbal cues, and Handouts Education comprehension: verbalized understanding, returned demonstration, and verbal cues required  HOME EXERCISE PROGRAM:  Wall Leg Abduction 5  per day hold 30 sec -45 sec  [email or web address] Hip Raise Pillow Stack 3-4 days per week 1 set x 15 reps with 10 sec hold  -4 inch lift   [email or web address]  ASSESSMENT:  CLINICAL IMPRESSION: Patient is a 47 y.o. white female who was seen today for physical therapy evaluation and treatment for right hip pain in the setting of glute med tendinopathy and right labral tear. She show signs of glute med tendinopathy with dull pain in lateral side of right hip, pain with resisted hip abduction, decreased passive internal rotation, and positive FABER on RLE. Lumbar radiculopathy ruled out with negative straight leg raise and hip impingement ruled out with negative FADIR. She shows decreased hip strength and pain with movement and prolonged sitting making job related tasks difficult. She will benefit from skilled PT to address aforementioned deficits to return to completing ADLs with less pain and difficulty.   OBJECTIVE IMPAIRMENTS: Abnormal gait, decreased strength, and pain.   ACTIVITY LIMITATIONS: carrying, lifting, bending, sitting, standing, squatting, and stairs  PARTICIPATION LIMITATIONS: community activity and occupation  PERSONAL FACTORS: Time since onset of injury/illness/exacerbation are also affecting patient's functional outcome.   REHAB POTENTIAL: Fair chronicity of right hip pain along with partial labral tear   CLINICAL DECISION MAKING: Stable/uncomplicated  EVALUATION COMPLEXITY: Low   GOALS: Goals reviewed with patient? No  SHORT TERM GOALS: Target date: 08/26/2022  Pt will be independent with HEP in order to improve strength and balance in order to decrease fall risk and improve function at home and work. Baseline: NT  Goal status: INITIAL  LONG TERM GOALS: Target date: 10/21/2022  Patient will have improved function and activity level as evidenced by an increase in FOTO score by 10 points or more.  Baseline: 72/100  Goal status: INITIAL  2.  Patient will improve right hip strength to be symmetrical to left hip for improve LE function.  Baseline: Hip Abduction R/L  4-/4 Goal status: INITIAL  3.  Patient will be able to perform single leg stance on right without hip drop as evidence of improved glute med strength for improve LE function.  Baseline: NT  Goal status: INITIAL  4. Patient will be able to ambulate without hip drop on right hip as evidence of improve LE function.   Baseline: NT  Goal status: INITIAL   PLAN:  PT FREQUENCY: 1-2x/week  PT DURATION: 10 weeks  PLANNED INTERVENTIONS: Therapeutic exercises, Neuromuscular re-education, Balance training, Gait training, Patient/Family education, Self Care, Joint mobilization, Joint manipulation, Aquatic Therapy, Dry Needling, Electrical stimulation, Cryotherapy, Moist heat, Manual therapy, and Re-evaluation  PLAN FOR NEXT SESSION: Gait analysis and single leg stance. Progress loading of glute med tendon    Bradly Chris PT, DPT  08/12/2022, 1:12 PM

## 2022-08-15 NOTE — Progress Notes (Unsigned)
No chief complaint on file.    HPI:      Ms. MAKYIAH MAYEDA is a 47 y.o. [id number] who LMP was No LMP recorded. (Menstrual status: IUD)., presents today for her annual examination.  Her menses are absent with IUD. Pt had DUB in past with endometrial mass on u/s, s/p hysteroscopy/D&C/polypectomy and new IUD placement with Dr. Glennon Mac 10/18. Doing well. No BTB/dysmen. Has bad vasomotor sx.   Sex activity: single partner, contraception - IUD. Mirena placed 03/10/17. Has dyspareunia, improved with lubricants. Last Pap: 01/18/17 Results were: no abnormalities /neg HPV DNA  Hx of STDs: none  Last mammogram: pt gets mammos/MRIs at [facility] yearly for hx of Breast mass/bx. Last mammo 02/13/20 was normal. Hx of Birads 3 3/21, breast MRI done 9/21 for RT breast was cat 2.  There is no FH of breast cancer. There is no FH of ovarian cancer. The patient does do self-breast exams.  Tobacco use: The patient denies current or previous tobacco use. Alcohol use: social No drug use Exercise: mod active  Complains of SUI and urge incont, with post void dribbling. Drinks 2 caffeine drinks daily, has done some kegels.   Colonoscopy: 4/20 at [facility] with polyps; pt high risk for CRC due to hx of para-aortic/splenic pedicle radiation for Hodgkins lymphoma age 89. Not sure when colonoscopy due again.   She does get adequate calcium but not Vitamin D in her diet. Diagnosed with Vit D deficiency in past.  She has labs with PCP. Followed by oncology for hx of Hodgkins and cardiology and endocrinology at [facility].    Past Medical History:  Diagnosis Date   Bacterial vaginitis    CHF (congestive heart failure) (HCC)    Colon polyps    Complication of anesthesia    nausea/vomiting   Depression    Diabetes mellitus without complication ([place])    History of abnormal mammogram 2013   '@[facility]'$ - NEG MRI AND BX OCT 2014 NORMAL   History of mammogram 03/2015   BREAST MRI AT [facility] WNL   History of Papanicolaou smear of cervix  10/08/2013; 08/04/15   -/-; ASCUS/HPV NEG;   Hodgkin's disease ([place]) 1987   She had surgical resection of Lymph nodes and chemo + rad tx's.   Hypertension    Hypothyroidism    2ND TO CA TX   PONV (postoperative nausea and vomiting)    Restrictive lung disease    Vitamin D deficiency     Past Surgical History:  Procedure Laterality Date   CHOLECYSTECTOMY     DEEP NECK LYMPH NODE BIOPSY / EXCISION  1987   hodgkin lymphoma   DILATATION & CURETTAGE/HYSTEROSCOPY WITH MYOSURE N/A 03/10/2017   Procedure: DILATATION & CURETTAGE/HYSTEROSCOPY WITH MYOSURE;  Surgeon: Will Bonnet, MD;  Location: [facility] ORS;  Service: Gynecology;  Laterality: N/A;   INTRAUTERINE DEVICE (IUD) INSERTION  [id number]; 02/06/09; 12/12/13   INTRAUTERINE DEVICE (IUD) INSERTION N/A 03/10/2017   Procedure: INTRAUTERINE DEVICE (IUD) INSERTION;  Surgeon: Will Bonnet, MD;  Location: [facility] ORS;  Service: Gynecology;  Laterality: N/A;   SPLENECTOMY     THYROIDECTOMY      Family History  Problem Relation Age of Onset   Hypertension Mother    Other Maternal Grandfather        bile duct cancer   Colon cancer Paternal Grandmother        41-70    Social History   Socioeconomic History   Marital status: Married    Spouse name: Not on  file   Number of children: Not on file   Years of education: 12   Highest education level: Not on file  Occupational History   Occupation: [employer]    Employer: [employer]  Tobacco Use   Smoking status: Former    Packs/day: 0.25    Years: 10.00    Total pack years: 2.50    Types: Cigarettes    Quit date: 03/10/2016    Years since quitting: 6.4   Smokeless tobacco: Never  Vaping Use   Vaping Use: Never used  Substance and Sexual Activity   Alcohol use: Yes    Alcohol/week: 2.0 standard drinks of alcohol    Types: 2 Glasses of wine per week    Comment: occasional   Drug use: No   Sexual activity: Yes    Birth control/protection: I.U.D.    Comment: Mirena   Other Topics Concern   Not on file  Social History Narrative   Not on file   Social Determinants of Health   Financial Resource Strain: Not on file  Food Insecurity: Not on file  Transportation Needs: Not on file  Physical Activity: Not on file  Stress: Not on file  Social Connections: Not on file  Intimate Partner Violence: Not on file     Current Outpatient Medications:    ALPRAZolam (XANAX) 0.5 MG tablet, TAKE 1 TABLET BY MOUTH AT BEDTIME AS NEEDED FOR ANXIETY AND 1/2 TAB DURING THE DAY FOR PANIC ATTACKS. DO NOT TAKE WITH MUSCLE RELAXANTS, Disp: 45 tablet, Rfl: 1   atorvastatin (LIPITOR) 40 MG tablet, Take 1 tablet by mouth daily., Disp: , Rfl:    atorvastatin (LIPITOR) 40 MG tablet, Take by mouth., Disp: , Rfl:    Continuous Blood Gluc Sensor (DEXCOM G6 SENSOR) MISC, SMARTSIG:1 EACH TOPICAL EVERY 10 DAYS, Disp: 9 each, Rfl: 2   Continuous Blood Gluc Transmit (DEXCOM G6 TRANSMITTER) MISC, USE TO MONITOR BLOOD SUGARS. CHANGE EVERY 90 DAYS., Disp: , Rfl:    diazepam (VALIUM) 5 MG tablet, Take 10 mg by mouth 2 (two) times daily., Disp: , Rfl:    glimepiride (AMARYL) 2 MG tablet, Take 2 mg by mouth every morning., Disp: , Rfl:    levonorgestrel (MIRENA) 20 MCG/24HR IUD, 1 each by Intrauterine route once., Disp: , Rfl:    levothyroxine (SYNTHROID) 88 MCG tablet, Take 88 mcg by mouth daily., Disp: , Rfl:    lisinopril (PRINIVIL,ZESTRIL) 5 MG tablet, Take 5 mg by mouth at bedtime. , Disp: , Rfl:    lisinopril (ZESTRIL) 5 MG tablet, Take by mouth., Disp: , Rfl:    methocarbamol (ROBAXIN) 750 MG tablet, Take 1 tablet by mouth 3 (three) times daily., Disp: , Rfl:    methocarbamol (ROBAXIN-750) 750 MG tablet, Take 1 tablet (750 mg total) by mouth 3 (three) times daily as needed for muscle spasms., Disp: 30 tablet, Rfl: 0   metoprolol succinate (TOPROL-XL) 50 MG 24 hr tablet, TAKE 1 TABLET BY MOUTH EVERY DAY, Disp: 90 tablet, Rfl: 1   pantoprazole (PROTONIX) 40 MG tablet, TAKE 1 TABLET BY  MOUTH EVERY DAY, Disp: 90 tablet, Rfl: 0   sertraline (ZOLOFT) 50 MG tablet, Take 1 tablet (50 mg total) by mouth daily., Disp: 90 tablet, Rfl: 1   SYNJARDY XR 12.10-998 MG TB24, Take 2 tablets by mouth every morning., Disp: , Rfl:    traMADol (ULTRAM) 50 MG tablet, Take 1 tablet (50 mg total) by mouth every 12 (twelve) hours as needed., Disp: 30 tablet, Rfl: 2  ROS:  Review of Systems  Constitutional:  Negative for fatigue, fever and unexpected weight change.  Respiratory:  Negative for cough, shortness of breath and wheezing.   Cardiovascular:  Negative for chest pain, palpitations and leg swelling.  Gastrointestinal:  Negative for blood in stool, constipation, diarrhea, nausea and vomiting.  Endocrine: Negative for cold intolerance, heat intolerance and polyuria.  Genitourinary:  Positive for dyspareunia. Negative for dysuria, flank pain, frequency, genital sores, hematuria, menstrual problem, pelvic pain, urgency, vaginal bleeding and vaginal discharge.  Musculoskeletal:  Negative for back pain, joint swelling and myalgias.  Skin:  Negative for rash.  Neurological:  Negative for dizziness, syncope, light-headedness, numbness and headaches.  Hematological:  Negative for adenopathy.  Psychiatric/Behavioral:  Negative for agitation, confusion, sleep disturbance and suicidal ideas. The patient is not nervous/anxious.      Objective: There were no vitals taken for this visit.   Physical Exam Constitutional:      Appearance: She is well-developed.  Genitourinary:     Vulva normal.     Right Labia: No rash, tenderness or lesions.    Left Labia: No tenderness, lesions or rash.    No vaginal discharge, erythema or tenderness.      Right Adnexa: not tender and no mass present.    Left Adnexa: not tender and no mass present.    No cervical motion tenderness, friability or polyp.     IUD strings visualized.     Uterus is not enlarged or tender.  Breasts:    Right: No mass, nipple  discharge, skin change or tenderness.     Left: No mass, nipple discharge, skin change or tenderness.  Neck:     Thyroid: No thyromegaly.  Cardiovascular:     Rate and Rhythm: Normal rate and regular rhythm.     Heart sounds: Normal heart sounds. No murmur heard. Pulmonary:     Effort: Pulmonary effort is normal.     Breath sounds: Normal breath sounds.  Abdominal:     Palpations: Abdomen is soft.     Tenderness: There is no abdominal tenderness. There is no guarding or rebound.  Musculoskeletal:        General: Normal range of motion.     Cervical back: Normal range of motion.  Lymphadenopathy:     Cervical: No cervical adenopathy.  Neurological:     General: No focal deficit present.     Mental Status: She is alert and oriented to person, place, and time.     Cranial Nerves: No cranial nerve deficit.  Skin:    General: Skin is warm and dry.  Psychiatric:        Mood and Affect: Mood normal.        Behavior: Behavior normal.        Thought Content: Thought content normal.        Judgment: Judgment normal.  Vitals reviewed.     Assessment/Plan: Encounter for annual routine gynecological examination  Encounter for routine checking of intrauterine contraceptive device (IUD)--IUD strings in place, due for rem 10/25.  Encounter for screening mammogram for malignant neoplasm of breast--pt sched at [facility].  Screening for colon cancer--pt with hx of polyps and increased risk of CRC. Pt to f/u with [facility] to see when due for repeat colonoscopy.   Perimenopausal vasomotor symptoms--add estroven after confirming no med interaction with pharmacist.   Mixed incontinence urge and stress--kegels, d/c caffeine, increase voiding frequency. Can do pelvic PT ref and add OAB meds, but pt not interested  currently. F/u prn.               GYN counsel adequate intake of calcium and vitamin D     F/U  No follow-ups on file.  Jamarious Febo B. Jahzion Brogden, PA-C 08/15/2022 6:22 PM

## 2022-08-16 ENCOUNTER — Encounter: Payer: Self-pay | Admitting: Obstetrics and Gynecology

## 2022-08-16 ENCOUNTER — Other Ambulatory Visit (HOSPITAL_COMMUNITY)
Admission: RE | Admit: 2022-08-16 | Discharge: 2022-08-16 | Disposition: A | Discharge status: Home / Self Care | Payer: BC Managed Care – PPO | Source: Ambulatory Visit | Attending: Obstetrics and Gynecology | Admitting: Obstetrics and Gynecology

## 2022-08-16 ENCOUNTER — Ambulatory Visit (INDEPENDENT_AMBULATORY_CARE_PROVIDER_SITE_OTHER): Payer: BC Managed Care – PPO | Admitting: Obstetrics and Gynecology

## 2022-08-16 VITALS — BP 110/72 | Ht 66.0 in | Wt 150.0 lb

## 2022-08-16 DIAGNOSIS — Z124 Encounter for screening for malignant neoplasm of cervix: Secondary | ICD-10-CM | POA: Insufficient documentation

## 2022-08-16 DIAGNOSIS — Z1151 Encounter for screening for human papillomavirus (HPV): Secondary | ICD-10-CM | POA: Insufficient documentation

## 2022-08-16 DIAGNOSIS — Z01419 Encounter for gynecological examination (general) (routine) without abnormal findings: Secondary | ICD-10-CM

## 2022-08-16 DIAGNOSIS — Z30431 Encounter for routine checking of intrauterine contraceptive device: Secondary | ICD-10-CM

## 2022-08-16 DIAGNOSIS — Z1231 Encounter for screening mammogram for malignant neoplasm of breast: Secondary | ICD-10-CM

## 2022-08-16 NOTE — Patient Instructions (Signed)
I value your feedback and you entrusting us with your care. If you get a [facility] patient survey, I would appreciate you taking the time to let us know about your experience today. Thank you! ? ? ?

## 2022-08-17 ENCOUNTER — Encounter: Payer: Self-pay | Admitting: Physical Therapy

## 2022-08-17 ENCOUNTER — Ambulatory Visit: Payer: BC Managed Care – PPO | Admitting: Physical Therapy

## 2022-08-17 DIAGNOSIS — R262 Difficulty in walking, not elsewhere classified: Secondary | ICD-10-CM | POA: Diagnosis not present

## 2022-08-17 DIAGNOSIS — S76011A Strain of muscle, fascia and tendon of right hip, initial encounter: Secondary | ICD-10-CM | POA: Diagnosis not present

## 2022-08-17 DIAGNOSIS — M25551 Pain in right hip: Secondary | ICD-10-CM | POA: Diagnosis not present

## 2022-08-17 NOTE — Therapy (Signed)
OUTPATIENT PHYSICAL THERAPY TREATMENT NOTE   Patient Name: Lori [name] MRN: [id number] DOB:Apr 03, 1976, 47 y.o., female Today's Date: 08/17/2022  PCP: Dr. [name]  REFERRING PROVIDER: Dr. Vanetta Mulders   END OF SESSION:   PT End of Session - 08/17/22 1027     Visit Number 2    Number of Visits 20    Date for PT Re-Evaluation 10/21/22    Authorization Type BCBS [id number] VL 30    Authorization - Visit Number 2    Authorization - Number of Visits 20    Progress Note Due on Visit 10    PT Start Time 1020    PT Stop Time 1100    PT Time Calculation (min) 40 min    Activity Tolerance Patient tolerated treatment well    Behavior During Therapy WFL for tasks assessed/performed             Past Medical History:  Diagnosis Date   Bacterial vaginitis    CHF (congestive heart failure) ([place])    Colon polyps    Complication of anesthesia    nausea/vomiting   Depression    Diabetes mellitus without complication ([place])    History of abnormal mammogram 2013   '@[facility]'$ - NEG MRI AND BX OCT 2014 NORMAL   History of mammogram 03/2015   BREAST MRI AT [facility] WNL   History of Papanicolaou smear of cervix 10/08/2013; 08/04/15   -/-; ASCUS/HPV NEG;   Hodgkin's disease ([place]) 1987   She had surgical resection of Lymph nodes and chemo + rad tx's.   Hypertension    Hypothyroidism    2ND TO CA TX   PONV (postoperative nausea and vomiting)    Restrictive lung disease    Vitamin D deficiency    Past Surgical History:  Procedure Laterality Date   CHOLECYSTECTOMY     DEEP NECK LYMPH NODE BIOPSY / EXCISION  1987   hodgkin lymphoma   DILATATION & CURETTAGE/HYSTEROSCOPY WITH MYOSURE N/A 03/10/2017   Procedure: DILATATION & CURETTAGE/HYSTEROSCOPY WITH MYOSURE;  Surgeon: Will Bonnet, MD;  Location: [facility] ORS;  Service: Gynecology;  Laterality: N/A;   INTRAUTERINE DEVICE (IUD) INSERTION  [id number]; 02/06/09; 12/12/13   INTRAUTERINE DEVICE (IUD) INSERTION N/A 03/10/2017   Procedure: INTRAUTERINE  DEVICE (IUD) INSERTION;  Surgeon: Will Bonnet, MD;  Location: [facility] ORS;  Service: Gynecology;  Laterality: N/A;   SPLENECTOMY     THYROIDECTOMY     Patient Active Problem List   Diagnosis Date Noted   Hypotension 05/13/2022   History of pyelonephritis 12/11/2021   Night sweats 10/12/2021   Depression, major, single episode, mild ([place]) 10/12/2021   GAD (generalized anxiety disorder) 07/06/2021   Insomnia 07/06/2021   Tear of right acetabular labrum 07/06/2021   Mixed incontinence urge and stress 10/27/2020   Endometrial mass 01/18/2017   DUB (dysfunctional uterine bleeding) 01/18/2017   Bacterial vaginosis 01/18/2017   Allergic rhinitis 06/03/2015   Anxiety 123456   Acute systolic heart failure ([place]) 06/03/2015   Clinical depression 06/03/2015   Diabetes mellitus, type 2 ([place]) 06/03/2015   Acid reflux 06/03/2015   History of biliary T-tube placement 06/03/2015   HD (Hodgkin's disease) ([place]) 06/03/2015   RAD (reactive airway disease) 06/03/2015   Fast heart beat 123456   Chronic systolic heart failure ([place]) 05/23/2014   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 She has been seeing various orthopedists and receiving joint injections. She has recently seen Dr. Vanetta Mulders who gave her an injection which has helped somewhat    PRECAUTIONS: None   SUBJECTIVE:                                                                                                                                                                                      SUBJECTIVE STATEMENT:  Pt reports that her right hip pain  continues to be down and that she had no problem performing exercises. She thinks that she is out of shape after her job went to work from home.    PAIN:  Are you having pain? No   OBJECTIVE: (objective measures completed at initial evaluation unless otherwise dated)  VITALS: BP 118/72 HR 72 SpO2 100    DIAGNOSTIC FINDINGS:   CLINICAL DATA:  Chronic, progressively worsening right hip pain.   EXAM: MR OF THE RIGHT HIP WITHOUT CONTRAST   TECHNIQUE: Multiplanar, multisequence MR imaging was performed. No intravenous contrast was administered.   COMPARISON:  Right hip x-rays dated May 16, 2018.   FINDINGS: Bones: There is no evidence of acute fracture, dislocation or avascular necrosis. No focal bone lesion. The visualized sacroiliac joints and symphysis pubis appear normal.   Articular cartilage and labrum   Articular cartilage: No focal chondral defect or subchondral signal abnormality identified.   Labrum: Bulbous and irregular appearance of the right anterior superior labrum with increased signal, consistent with partial tear (series 6, image 11). No paralabral abnormality.   Joint or bursal effusion   Joint effusion: No significant hip joint effusion.   Bursae: No focal periarticular fluid collection.   Muscles and tendons   Muscles and tendons: The visualized gluteus, hamstring and iliopsoas tendons appear normal. No muscle edema or atrophy.   Other findings   Miscellaneous: The visualized internal pelvic contents appear unremarkable. IUD within the uterus.   IMPRESSION: 1. Partial tear of the right anterior superior labrum.     Electronically Signed   By: Titus Dubin M.D.   On: 04/10/2021 10:54   /////////////////////////////////////////////////////////////////////////////////////////////////////////////////  CLINICAL DATA:  Pain in right hip M25.551 (ICD-10-CM).   EXAM: MRI LUMBAR SPINE WITHOUT CONTRAST   TECHNIQUE: Multiplanar,  multisequence MR imaging of the lumbar spine was performed. No intravenous contrast was administered.   COMPARISON:  Radiographs May 26, 2022.   FINDINGS: Segmentation:  Standard.   Alignment:  Physiologic.  Vertebrae:  No fracture, evidence of discitis, or bone lesion.   Conus medullaris and cauda equina: Conus extends to the L1 level. Conus and cauda equina appear normal.   Paraspinal and other soft tissues: Negative.   Disc levels:   T12-L1: No spinal canal or neural foraminal stenosis.   L1-2: No spinal canal or neural foraminal stenosis.   L2-3: No spinal canal or neural foraminal stenosis.   L3-4: Shallow disc bulge and mild facet degenerative changes. No significant spinal canal or neural foraminal stenosis.   L4-5: Disc bulge and mild facet degenerative changes resulting mild bilateral neural foraminal narrowing. No significant spinal canal stenosis.   L5-S1: No spinal canal or neural foraminal stenosis.   IMPRESSION: 1. Mild degenerative changes at L3-4 and L4-5. 2. Mild bilateral neural foraminal narrowing at L4-5. 3. No high-grade spinal canal or neural foraminal stenosis at any level.     Electronically Signed   By: Pedro Earls M.D.   On: 06/23/2022 15:38     PATIENT SURVEYS:  FOTO 72/100 with target of 79    COGNITION: Overall cognitive status: Within functional limits for tasks assessed                         SENSATION: WFL   MUSCLE LENGTH: Hamstrings: Right 80 deg; Left 90 deg Thomas test: Negative bilateral    POSTURE: No Significant postural limitations   PALPATION: Right glute med TTP    LOWER EXTREMITY ROM:   Active ROM Right eval Left eval  Hip flexion      Hip extension      Hip abduction      Hip adduction      Hip internal rotation 35 45  Hip external rotation 45 45  Knee flexion      Knee extension      Ankle dorsiflexion      Ankle plantarflexion      Ankle inversion      Ankle eversion        (Blank rows = not tested)            LOWER EXTREMITY MMT:   MMT Right eval Left eval  Hip flexion 5 5  Hip extension 4 4  Hip abduction 4-* 4  Hip adduction 4 4  Hip internal rotation      Hip external rotation      Knee flexion 5 5  Knee extension 5 5  Ankle dorsiflexion 5 5  Ankle plantarflexion      Ankle inversion      Ankle eversion       (Blank rows = not tested)   LOWER EXTREMITY SPECIAL TESTS:  Hip special tests: Saralyn Pilar (FABER) test: positive , Thomas test: negative, Ober's test: negative, Ely's test: negative, and Hip scouring test: negative Lumbar: Straight Leg Raise: Negative    FUNCTIONAL TESTS:  Squat: NT    GAIT: Not performed  Distance walked: NT  Assistive device utilized: None Level of assistance: Complete Independence Comments: Did not analyze gait      TODAY'S TREATMENT:  DATE:   08/17/22: TM at 2.0 mph for 5 min and BUE support  Standing Hip Abduction with BUE support on RLE 3 x 10  Standing Hip Abduction with BUE support on RLE #3 AW 3 x 10  Lateral Step Down on RLE with BUE support  on 4 inch step 3 x 10  Education on walking log and start with 5 min and do 3x per week    08/12/22:  Isometric RLE Hip Abduction with 10 sec hold in side lying x 10 reps  Standing Right Hip Abduction Against wall 5 x 30 sec      PATIENT EDUCATION:  Education details: form and technique for accurate performance of exercise and explanation about underlying pathology  Person educated: Patient Education method: Consulting civil engineer, Demonstration, Verbal cues, and Handouts Education comprehension: verbalized understanding, returned demonstration, and verbal cues required   HOME EXERCISE PROGRAM:   Wall Leg Abduction 5  per day hold 30 sec -45 sec  [email or web address] Hip Raise Pillow Stack 3-4 days per week 1  set x 15 reps with 10 sec hold  -4 inch lift  [email or web address]   ASSESSMENT:   CLINICAL IMPRESSION: Pt presents for f/u for right hip pain in setting of glute med tendinopathy and labral partial tear. She right hip glute med weakness with hip drop in stance phase. She was able to complete isotonic hip strengthening exercises without an increase in her right hip pain. She will continue to benefit from skilled PT to address aforementioned deficits to return to completing ADLs with less pain and difficulty.    OBJECTIVE IMPAIRMENTS: Abnormal gait, decreased strength, and pain.    ACTIVITY LIMITATIONS: carrying, lifting, bending, sitting, standing, squatting, and stairs   PARTICIPATION LIMITATIONS: community activity and occupation   PERSONAL FACTORS: Time since onset of injury/illness/exacerbation are also affecting patient's functional outcome.    REHAB POTENTIAL: Fair chronicity of right hip pain along with partial labral tear    CLINICAL DECISION MAKING: Stable/uncomplicated   EVALUATION COMPLEXITY: Low     GOALS: Goals reviewed with patient? No   SHORT TERM GOALS: Target date: 08/26/2022   Pt will be independent with HEP in order to improve strength and balance in order to decrease fall risk and improve function at home and work. Baseline: Performing exercises independently  Goal status: Ongoing    LONG TERM GOALS: Target date: 10/21/2022   Patient will have improved function and activity level as evidenced by an increase in FOTO score by 10 points or more.  Baseline: 72/100  Goal status: Ongoing    2.  Patient will improve right hip strength to be symmetrical to left hip for improve LE function.  Baseline: Hip Abduction R/L 4-/4 Goal status: Ongoing    3.  Patient will be able to perform single leg stance on right without hip drop as evidence of improved glute med strength for improve LE function.  Baseline: No hip drop at baseline  Goal  status: Deferred   4. Patient will be able to ambulate without hip drop on right hip as evidence of improve LE function.   Baseline: Right hip drop noted with RLE as stance leg  Goal status: Ongoing      PLAN:   PT FREQUENCY: 1-2x/week   PT DURATION: 10 weeks   PLANNED INTERVENTIONS: Therapeutic exercises, Neuromuscular re-education, Balance training, Gait training, Patient/Family education, Self Care, Joint mobilization, Joint manipulation, Aquatic Therapy, Dry Needling, Electrical stimulation, Cryotherapy, Moist heat, Manual therapy, and Re-evaluation   PLAN FOR NEXT  SESSION:  Progress loading of glute med tendon: Isotonic loading phase     Bradly Chris PT, DPT  08/17/2022, 10:28 AM

## 2022-08-18 LAB — CYTOLOGY - PAP
Comment: NEGATIVE
Diagnosis: NEGATIVE
High risk HPV: NEGATIVE

## 2022-08-19 ENCOUNTER — Ambulatory Visit: Payer: BC Managed Care – PPO | Admitting: Physical Therapy

## 2022-08-19 ENCOUNTER — Telehealth: Payer: Self-pay | Admitting: Physical Therapy

## 2022-08-19 NOTE — Telephone Encounter (Signed)
Called pt to inquire about absence from PT. Pt did not respond but she mentioned having to possibly attend a family event during last appointment.

## 2022-08-24 ENCOUNTER — Ambulatory Visit: Payer: BC Managed Care – PPO | Admitting: Physical Therapy

## 2022-08-24 ENCOUNTER — Telehealth: Payer: Self-pay | Admitting: *Deleted

## 2022-08-24 NOTE — Telephone Encounter (Signed)
Called pt to get more information. Marland Kitchenleft message to have patient return my call.  Debroah Loop, PA-C  You8 minutes ago (4:40 PM)    Her Iva Boop increases her risk for recurrent UTIs. Can you please triage her symptoms and offer her an office visit with [facility]?      [place] (supporting [employer], PA-C)8 hours ago (8:15 AM)    Verdene Rio,  I saw you a few weeks ago, and we discussed uti's/bladder "stuff", once again I feel like I have a UTI.  Is this normal because of the meds I am on?  Or do you think it may be related to something else? Should I be doing something different?   Thank you, Derek Jack

## 2022-08-25 NOTE — Telephone Encounter (Signed)
Patient called in today and states she might have a uti, but she states she is going to drink watter the rest of the day ands see what happens. She said she has got a lot of sugar in her urine, She will cal [place] back and let us know how she is doing.

## 2022-08-26 ENCOUNTER — Ambulatory Visit: Payer: BC Managed Care – PPO | Admitting: Physical Therapy

## 2022-08-26 ENCOUNTER — Ambulatory Visit (INDEPENDENT_AMBULATORY_CARE_PROVIDER_SITE_OTHER): Payer: BC Managed Care – PPO | Admitting: Physician Assistant

## 2022-08-26 ENCOUNTER — Encounter: Payer: Self-pay | Admitting: Physician Assistant

## 2022-08-26 VITALS — BP 93/59 | HR 83 | Temp 98.0°F | Ht 66.0 in | Wt 148.0 lb

## 2022-08-26 DIAGNOSIS — M25551 Pain in right hip: Secondary | ICD-10-CM | POA: Diagnosis not present

## 2022-08-26 DIAGNOSIS — R8281 Pyuria: Secondary | ICD-10-CM

## 2022-08-26 DIAGNOSIS — S76011A Strain of muscle, fascia and tendon of right hip, initial encounter: Secondary | ICD-10-CM | POA: Diagnosis not present

## 2022-08-26 DIAGNOSIS — R300 Dysuria: Secondary | ICD-10-CM | POA: Diagnosis not present

## 2022-08-26 DIAGNOSIS — N390 Urinary tract infection, site not specified: Secondary | ICD-10-CM

## 2022-08-26 DIAGNOSIS — N3289 Other specified disorders of bladder: Secondary | ICD-10-CM | POA: Diagnosis not present

## 2022-08-26 DIAGNOSIS — R262 Difficulty in walking, not elsewhere classified: Secondary | ICD-10-CM

## 2022-08-26 LAB — URINALYSIS, COMPLETE
Bilirubin, UA: NEGATIVE
Ketones, UA: NEGATIVE
Nitrite, UA: NEGATIVE
Protein,UA: NEGATIVE
Specific Gravity, UA: 1.01 (ref 1.005–1.030)
Urobilinogen, Ur: 0.2 mg/dL (ref 0.2–1.0)
pH, UA: 5.5 (ref 5.0–7.5)

## 2022-08-26 LAB — MICROSCOPIC EXAMINATION

## 2022-08-26 NOTE — Telephone Encounter (Signed)
.  left message to have patient return my call.  

## 2022-08-26 NOTE — Patient Instructions (Signed)

## 2022-08-26 NOTE — Therapy (Signed)
OUTPATIENT PHYSICAL THERAPY TREATMENT NOTE   Patient Name: Lori [name] MRN: [id number] DOB:July 21, 1975, 47 y.o., female Today's Date: 08/26/2022  PCP: Dr. [name]  REFERRING PROVIDER: Dr. Vanetta Mulders   END OF SESSION:   PT End of Session - 08/26/22 0932     Visit Number 3    Number of Visits 20    Date for PT Re-Evaluation 10/21/22    Authorization Type [employer]BS [id number] VL [id number]    Authorization - Visit Number 3    Authorization - Number of Visits 20    Progress Note Due on Visit 10    PT Start Time 0845    PT Stop Time 0930    PT Time Calculation (min) 45 min    Activity Tolerance Patient tolerated treatment well    Behavior During Therapy [facility] for tasks assessed/performed              Past Medical History:  Diagnosis Date   Bacterial vaginitis    CHF (congestive heart failure) (HCC)    Colon polyps    Complication of anesthesia    nausea/vomiting   Depression    Diabetes mellitus without complication ([place])    History of abnormal mammogram 2013   @[facility] - NEG MRI AND BX OCT 2014 NORMAL   History of mammogram 03/2015   BREAST MRI AT [facility] WNL   History of Papanicolaou smear of cervix 10/08/2013; 08/04/15   -/-; ASCUS/HPV NEG;   Hodgkin's disease ([place]) 1987   She had surgical resection of Lymph nodes and chemo + rad tx's.   Hypertension    Hypothyroidism    2ND TO [place] TX   PONV (postoperative nausea and vomiting)    Restrictive lung disease    Vitamin D deficiency    Past Surgical History:  Procedure Laterality Date   CHOLECYSTECTOMY     DEEP NECK LYMPH NODE BIOPSY / EXCISION  1987   hodgkin lymphoma   DILATATION & CURETTAGE/HYSTEROSCOPY WITH MYOSURE N/A 03/10/2017   Procedure: DILATATION & CURETTAGE/HYSTEROSCOPY WITH MYOSURE;  Surgeon: Will Bonnet, MD;  Location: [facility] ORS;  Service: Gynecology;  Laterality: N/A;   INTRAUTERINE DEVICE (IUD) INSERTION  [id number]; 02/06/09; 12/12/13   INTRAUTERINE DEVICE (IUD) INSERTION N/A 03/10/2017   Procedure:  INTRAUTERINE DEVICE (IUD) INSERTION;  Surgeon: Will Bonnet, MD;  Location: [facility] ORS;  Service: Gynecology;  Laterality: N/A;   SPLENECTOMY     THYROIDECTOMY     Patient Active Problem List   Diagnosis Date Noted   Hypotension 05/13/2022   History of pyelonephritis 12/11/2021   Night sweats 10/12/2021   Depression, major, single episode, mild ([place]) 10/12/2021   GAD (generalized anxiety disorder) 07/06/2021   Insomnia 07/06/2021   Tear of right acetabular labrum 07/06/2021   Mixed incontinence urge and stress 10/27/2020   Endometrial mass 01/18/2017   DUB (dysfunctional uterine bleeding) 01/18/2017   Bacterial vaginosis 01/18/2017   Allergic rhinitis 06/03/2015   Anxiety 123456   Acute systolic heart failure ([place]) 06/03/2015   Clinical depression 06/03/2015   Diabetes mellitus, type 2 ([place]) 06/03/2015   Acid reflux 06/03/2015   History of biliary T-tube placement 06/03/2015   HD (Hodgkin's disease) ([place]) 06/03/2015   RAD (reactive airway disease) 06/03/2015   Fast heart beat 123456   Chronic systolic heart failure ([place]) 05/23/2014   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  Rationale for Evaluation and Treatment Rehabilitation  PERTINENT HISTORY: Pt reports that she has been dealing with right hip pain for the better part of four to five years. She has been seeing various orthopedists and receiving joint injections. She has recently seen Dr. Vanetta Mulders who gave her an injection which has helped somewhat    PRECAUTIONS: None   SUBJECTIVE:                                                                                                                                                                                      SUBJECTIVE STATEMENT:  Pt states that her hip pain continues to feel less and that exercises did  not increase pain at all.    PAIN:  Are you having pain? No   OBJECTIVE: (objective measures completed at initial evaluation unless otherwise dated)  VITALS: BP 118/72 HR 72 SpO2 100    DIAGNOSTIC FINDINGS:   CLINICAL DATA:  Chronic, progressively worsening right hip pain.   EXAM: MR OF THE RIGHT HIP WITHOUT CONTRAST   TECHNIQUE: Multiplanar, multisequence MR imaging was performed. No intravenous contrast was administered.   COMPARISON:  Right hip x-rays dated May 16, 2018.   FINDINGS: Bones: There is no evidence of acute fracture, dislocation or avascular necrosis. No focal bone lesion. The visualized sacroiliac joints and symphysis pubis appear normal.   Articular cartilage and labrum   Articular cartilage: No focal chondral defect or subchondral signal abnormality identified.   Labrum: Bulbous and irregular appearance of the right anterior superior labrum with increased signal, consistent with partial tear (series 6, image 11). No paralabral abnormality.   Joint or bursal effusion   Joint effusion: No significant hip joint effusion.   Bursae: No focal periarticular fluid collection.   Muscles and tendons   Muscles and tendons: The visualized gluteus, hamstring and iliopsoas tendons appear normal. No muscle edema or atrophy.   Other findings   Miscellaneous: The visualized internal pelvic contents appear unremarkable. IUD within the uterus.   IMPRESSION: 1. Partial tear of the right anterior superior labrum.     Electronically Signed   By: Titus Dubin M.D.   On: 04/10/2021 10:54   /////////////////////////////////////////////////////////////////////////////////////////////////////////////////  CLINICAL DATA:  Pain in right hip M25.551 (ICD-10-CM).   EXAM: MRI LUMBAR SPINE WITHOUT CONTRAST   TECHNIQUE: Multiplanar, multisequence MR imaging of the lumbar spine was performed. No intravenous contrast was administered.   COMPARISON:   Radiographs May 26, 2022.   FINDINGS: Segmentation:  Standard.   Alignment:  Physiologic.   Vertebrae:  No fracture, evidence of discitis, or bone lesion.   Conus medullaris and cauda equina: Conus extends to the L1  level. Conus and cauda equina appear normal.   Paraspinal and other soft tissues: Negative.   Disc levels:   T12-L1: No spinal canal or neural foraminal stenosis.   L1-2: No spinal canal or neural foraminal stenosis.   L2-3: No spinal canal or neural foraminal stenosis.   L3-4: Shallow disc bulge and mild facet degenerative changes. No significant spinal canal or neural foraminal stenosis.   L4-5: Disc bulge and mild facet degenerative changes resulting mild bilateral neural foraminal narrowing. No significant spinal canal stenosis.   L5-S1: No spinal canal or neural foraminal stenosis.   IMPRESSION: 1. Mild degenerative changes at L3-4 and L4-5. 2. Mild bilateral neural foraminal narrowing at L4-5. 3. No high-grade spinal canal or neural foraminal stenosis at any level.     Electronically Signed   By: Pedro Earls M.D.   On: 06/23/2022 15:38     PATIENT SURVEYS:  FOTO 72/100 with target of 79    COGNITION: Overall cognitive status: Within functional limits for tasks assessed                         SENSATION: WFL   MUSCLE LENGTH: Hamstrings: Right 80 deg; Left 90 deg Thomas test: Negative bilateral    POSTURE: No Significant postural limitations   PALPATION: Right glute med TTP    LOWER EXTREMITY ROM:   Active ROM Right eval Left eval  Hip flexion      Hip extension      Hip abduction      Hip adduction      Hip internal rotation 35 45  Hip external rotation 45 45  Knee flexion      Knee extension      Ankle dorsiflexion      Ankle plantarflexion      Ankle inversion      Ankle eversion       (Blank rows = not tested)            LOWER EXTREMITY MMT:   MMT Right eval Left eval  Hip flexion 5 5   Hip extension 4 4  Hip abduction 4-* 4  Hip adduction 4 4  Hip internal rotation      Hip external rotation      Knee flexion 5 5  Knee extension 5 5  Ankle dorsiflexion 5 5  Ankle plantarflexion      Ankle inversion      Ankle eversion       (Blank rows = not tested)   LOWER EXTREMITY SPECIAL TESTS:  Hip special tests: Saralyn Pilar (FABER) test: positive , Thomas test: negative, Ober's test: negative, Ely's test: negative, and Hip scouring test: negative Lumbar: Straight Leg Raise: Negative    FUNCTIONAL TESTS:  Squat: NT    GAIT: Not performed  Distance walked: NT  Assistive device utilized: None Level of assistance: Complete Independence Comments: Did not analyze gait      TODAY'S TREATMENT:  DATE:   08/25/22: Matrix Recumbent Bicycle with resistance 3 and seat 5 for  5 min  Resisted side step with red band for 20 ft x 6  Lateral step down on 4 inch with  RLE with 1 UE support 3 x 10  OMEGA Leg Press #85 2 x 10 -Pt unable to perform third set due to muscle fatigue  Mini-Squat 3 x 10  -mod VC to shift weight onto heels by sitting on mat surface  08/17/22: TM at 2.0 mph for 5 min and BUE support  Standing Hip Abduction with BUE support on RLE 3 x 10  Standing Hip Abduction with BUE support on RLE #3 AW 3 x 10  Lateral Step Down on RLE with BUE support  on 4 inch step 3 x 10  Education on walking log and start with 5 min and do 3x per week    08/12/22:  Isometric RLE Hip Abduction with 10 sec hold in side lying x 10 reps  Standing Right Hip Abduction Against wall 5 x 30 sec      PATIENT EDUCATION:  Education details: form and technique for accurate performance of exercise and explanation about underlying pathology  Person educated: Patient Education method: Explanation, Demonstration, Verbal cues, and Handouts Education comprehension: verbalized  understanding, returned demonstration, and verbal cues required   HOME EXERCISE PROGRAM:  Access Code: [email or web address] URL: [email or web address] Date: 08/26/2022 Prepared by: Bradly Chris  Exercises - Side Step Down with Counter Support  - 3 x weekly - 3 sets - 10 reps - Side Stepping with Resistance at Ankles  - 3 x weekly - 1 sets - 10 reps - Mini Squat  - 3 x weekly - 3 sets - 10 reps - Standing Quadriceps Stretch  - 1 x daily - 3 reps - 30 sec  hold   ASSESSMENT:   CLINICAL IMPRESSION: Pt continues to show tolerance for LE strengthening exercises without an exacerbation of her right hip pain. Glute med exercses progressed to include decreased UE support with lateral step down. She will continue to benefit from skilled PT to address aforementioned deficits to return to completing ADLs with less pain and difficulty.   OBJECTIVE IMPAIRMENTS: Abnormal gait, decreased strength, and pain.    ACTIVITY LIMITATIONS: carrying, lifting, bending, sitting, standing, squatting, and stairs   PARTICIPATION LIMITATIONS: community activity and occupation   PERSONAL FACTORS: Time since onset of injury/illness/exacerbation are also affecting patient's functional outcome.    REHAB POTENTIAL: Fair chronicity of right hip pain along with partial labral tear    CLINICAL DECISION MAKING: Stable/uncomplicated   EVALUATION COMPLEXITY: Low     GOALS: Goals reviewed with patient? No   SHORT TERM GOALS: Target date: 08/26/2022   Pt will be independent with HEP in order to improve strength and balance in order to decrease fall risk and improve function at home and work. Baseline: Performing exercises independently  Goal status: Ongoing    LONG TERM GOALS: Target date: 10/21/2022   Patient will have improved function and activity level as evidenced by an increase in FOTO score by 10 points or more.  Baseline: 72/100  Goal status: Ongoing    2.  Patient will improve right hip strength to  be symmetrical to left hip for improve LE function.  Baseline: Hip Abduction R/L 4-/4 Goal status: Ongoing    3.  Patient will be able to perform single leg stance on right without hip drop as evidence of improved glute med strength for improve LE  function.  Baseline: No hip drop at baseline  Goal status: Deferred   4. Patient will be able to ambulate without hip drop on right hip as evidence of improve LE function.   Baseline: Right hip drop noted with RLE as stance leg  Goal status: Ongoing      PLAN:   PT FREQUENCY: 1-2x/week   PT DURATION: 10 weeks   PLANNED INTERVENTIONS: Therapeutic exercises, Neuromuscular re-education, Balance training, Gait training, Patient/Family education, Self Care, Joint mobilization, Joint manipulation, Aquatic Therapy, Dry Needling, Electrical stimulation, Cryotherapy, Moist heat, Manual therapy, and Re-evaluation   PLAN FOR NEXT SESSION:  Progress loading of glutes: Standing hip abduction with decreased UE support, Leg Press with increased resistance     Bradly Chris PT, DPT  08/26/2022, 9:32 AM

## 2022-08-26 NOTE — Progress Notes (Signed)
08/26/2022 2:21 PM   Lori [name] 05-Nov-1975 [id number]  CC: Chief Complaint  Patient presents with   Urinary Tract Infection   HPI: Lori [name] is a 47 y.o. female with PMH diabetes on Synjardy, hematuria in the setting of urinary infection, and OAB wet with mixed urge and stress incontinence who presents today for evaluation of possible UTI.   Today she reports a several day history of dysuria that persists following termination as well as bladder/urethral discomfort.  Overall she feels that something just "is not right."  She reports today that she has been on some formulation of Jardiance for greater than 1 year, but her bladder symptoms are newer, occurring over the past couple of months.  She had a CTAP without contrast on 12/09/2021 with no evidence of urolithiasis per radiology report, unable to review original imaging.  In-office UA today positive for 3+ glucose, trace lysed blood, and 1+ leukocytes; urine microscopy with 6-10 WBCs/HPF.  PMH: Past Medical History:  Diagnosis Date   Bacterial vaginitis    CHF (congestive heart failure) (HCC)    Colon polyps    Complication of anesthesia    nausea/vomiting   Depression    Diabetes mellitus without complication ([place])    History of abnormal mammogram 2013   @[facility] - NEG MRI AND BX OCT 2014 NORMAL   History of mammogram 03/2015   BREAST MRI AT [facility] WNL   History of Papanicolaou smear of cervix 10/08/2013; 08/04/15   -/-; ASCUS/HPV NEG;   Hodgkin's disease ([place]) 1987   She had surgical resection of Lymph nodes and chemo + rad tx's.   Hypertension    Hypothyroidism    2ND TO [place] TX   PONV (postoperative nausea and vomiting)    Restrictive lung disease    Vitamin D deficiency     Surgical History: Past Surgical History:  Procedure Laterality Date   CHOLECYSTECTOMY     DEEP NECK LYMPH NODE BIOPSY / EXCISION  1987   hodgkin lymphoma   DILATATION & CURETTAGE/HYSTEROSCOPY WITH MYOSURE N/A 03/10/2017   Procedure:  DILATATION & CURETTAGE/HYSTEROSCOPY WITH MYOSURE;  Surgeon: Will Bonnet, MD;  Location: [facility] OR[facility];  Service: Gynecology;  Laterality: N/A;   INTRAUTERINE DEVICE (IUD) INSERTION  [id number]; 02/06/09; 12/12/13   INTRAUTERINE DEVICE (IUD) INSERTION N/A 03/10/2017   Procedure: INTRAUTERINE DEVICE (IUD) INSERTION;  Surgeon: Will Bonnet, MD;  Location: [facility] OR[facility];  Service: Gynecology;  Laterality: N/A;   SPLENECTOMY     THYROIDECTOMY      Home Medications:  Allergies as of 08/26/2022       Reactions   Dapagliflozin Other (See Comments)   [place]) UTI   Liraglutide Nausea Only, Nausea And Vomiting   Victoza Victoza        Medication List        Accurate as of August 26, 2022  2:21 PM. If you have any questions, ask your nurse or doctor.          STOP taking these medications    amoxicillin 500 MG capsule Commonly known as: AMOXIL Stopped by: Debroah Loop, PA-C   ibuprofen 800 MG tablet Commonly known as: ADVIL Stopped by: Debroah Loop, PA-C       TAKE these medications    ALPRAZolam 0.5 MG tablet Commonly known as: XANAX TAKE 1 TABLET BY MOUTH AT BEDTIME AS NEEDED FOR ANXIETY AND 1/2 TAB DURING THE DAY FOR PANIC ATTACKS. DO NOT TAKE WITH MUSCLE RELAXANTS What changed:  when to take this  reasons to take this   atorvastatin 40 MG tablet Commonly known as: LIPITOR Take 1 tablet by mouth daily.   Dexcom G6 Sensor Misc SMARTSIG:1 EACH TOPICAL EVERY 10 DAYS   Dexcom G6 Transmitter Misc USE TO MONITOR BLOOD SUGARS. CHANGE EVERY 90 DAYS.   glimepiride 4 MG tablet Commonly known as: AMARYL Take by mouth.   levonorgestrel 20 MCG/24HR IUD Commonly known as: MIRENA 1 each by Intrauterine route once.   levothyroxine 75 MCG tablet Commonly known as: SYNTHROID Take 75 mcg by mouth daily.   lisinopril 5 MG tablet Commonly known as: ZESTRIL Take by mouth.   methocarbamol 750 MG tablet Commonly known as: ROBAXIN Take 1 tablet by mouth 3  (three) times daily as needed for muscle spasms.   metoprolol succinate 50 MG 24 hr tablet Commonly known as: TOPROL-XL TAKE 1 TABLET BY MOUTH EVERY DAY What changed:  when to take this reasons to take this   pantoprazole 40 MG tablet Commonly known as: PROTONIX TAKE 1 TABLET BY MOUTH EVERY DAY   sertraline 50 MG tablet Commonly known as: ZOLOFT Take 1 tablet (50 mg total) by mouth daily.   Synjardy XR 12.10-998 MG Tb24 Generic drug: Empagliflozin-metFORMIN HCl ER Take 2 tablets by mouth every morning.   traMADol 50 MG tablet Commonly known as: ULTRAM Take 1 tablet (50 mg total) by mouth every 12 (twelve) hours as needed. What changed:  when to take this reasons to take this        Allergies:  Allergies  Allergen Reactions   Dapagliflozin Other (See Comments)    (Farxiga) UTI   Liraglutide Nausea Only and Nausea And Vomiting    Victoza Victoza    Family History: Family History  Problem Relation Age of Onset   Hypertension Mother    Other Maternal Grandfather        bile duct cancer   Colon cancer Paternal Grandmother        86-70    Social History:   reports that she quit smoking about 6 years ago. Her smoking use included cigarettes. She has a 2.50 pack-year smoking history. She has never used smokeless tobacco. She reports current alcohol use of about 2.0 standard drinks of alcohol per week. She reports that she does not use drugs.  Physical Exam: BP (!) 93/59 (BP Location: Left Arm, Patient Position: Sitting, Cuff Size: Normal)   Pulse 83   Temp 98 F (36.7 C) (Oral)   Ht 5\' 6"  (1.676 m)   Wt 148 lb (67.1 kg)   BMI 23.89 kg/m   Constitutional:  Alert and oriented, no acute distress, nontoxic appearing HEENT: [place], AT Cardiovascular: No clubbing, cyanosis, or edema Respiratory: Normal respiratory effort, no increased work of breathing Skin: No rashes, bruises or suspicious lesions Neurologic: Grossly intact, no focal deficits, moving all 4  extremities Psychiatric: Normal mood and affect  Laboratory Data: Results for orders placed or performed in visit on 08/26/22  Microscopic Examination   Urine  Result Value Ref Range   WBC, UA 6-10 (A) 0 - 5 /hpf   RBC, Urine 0-2 0 - 2 /hpf   Epithelial Cells (non renal) 0-10 0 - 10 /hpf   Bacteria, UA Few None seen/Few  Urinalysis, Complete  Result Value Ref Range   Specific Gravity, UA 1.010 1.005 - 1.030   pH, UA 5.5 5.0 - 7.5   Color, UA Yellow Yellow   Appearance Ur Hazy (A) Clear   Leukocytes,UA 1+ (A) Negative   Protein,UA  Negative Negative/Trace   Glucose, UA 3+ (A) Negative   Ketones, UA Negative Negative   RBC, UA Trace (A) Negative   Bilirubin, UA Negative Negative   Urobilinogen, Ur 0.2 0.2 - 1.0 mg/dL   Nitrite, UA Negative Negative   Microscopic Examination See below:    Assessment & Plan:   1. Recurrent UTI UA today notable only for mild pyuria, low suspicion for acute cystitis, though will send for standard and atypical cultures and treat as indicated.  She has been on Jardiance consistently for greater than 1 year with only new her urinary symptoms, so I do not think that her dysuria and bladder discomfort are likely to be side effects of this medication.  I offered her cystoscopy for further evaluation of her bladder and she accepted. - Urinalysis, Complete - CULTURE, URINE COMPREHENSIVE - Mycoplasma / ureaplasma culture   Return in about 3 weeks (around 09/16/2022) for Cysto with Dr. Bernardo Heater, Will call with results.  Debroah Loop, PA-C  [facility] [address] [place], [place] [address] [phone]

## 2022-08-26 NOTE — Telephone Encounter (Signed)
Per patient, he hurts when she urinates, she hurt when she's sitting, " weird sensation"appt scheduled at 1:30 today

## 2022-08-29 IMAGING — CT CT HIP*R* W/O CM
2 of 6 series · 14 of 46 positions shown, 16 images · non-contrast
Comparison: MR right hip 04/09/2021

CLINICAL DATA: Right hip pain for 1 year

EXAM:
CT OF THE RIGHT HIP WITHOUT CONTRAST
TECHNIQUE: Multidetector CT imaging of the right hip was performed according to
the standard protocol. Multiplanar CT image reconstructions were
also generated.
RADIATION DOSE REDUCTION: This exam was performed according to the
departmental dose-optimization program which includes automated
exposure control, adjustment of the mA and/or kV according to
patient size and/or use of iterative reconstruction technique.

[Series 4: r hip hd fov 1.00 · axial · 0.40mm/px · z∈[+1004,+1328]mm · 11 of 384 slices shown, 13 images]
[im 30/384  soft-tissue]
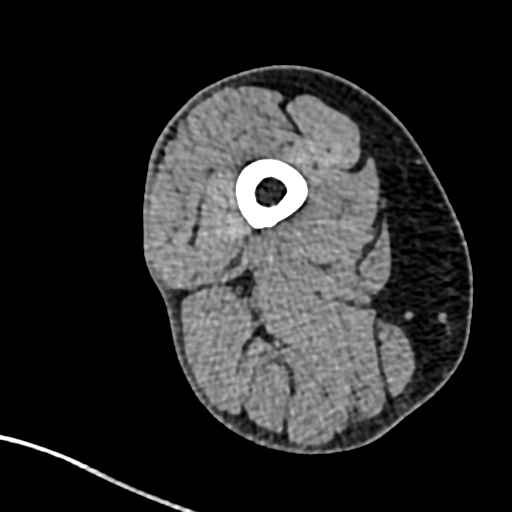
[im 30/384  bone]
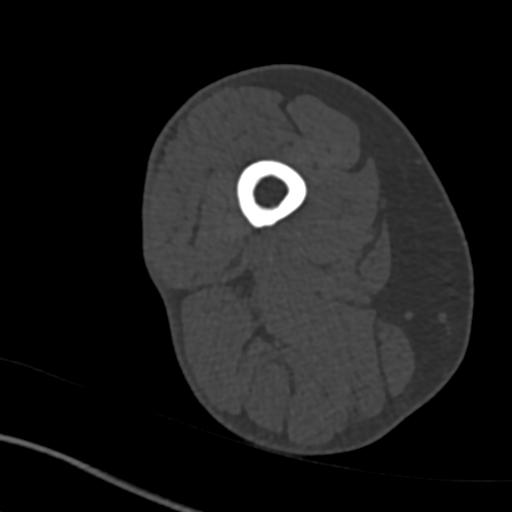
[im 59/384  soft-tissue]
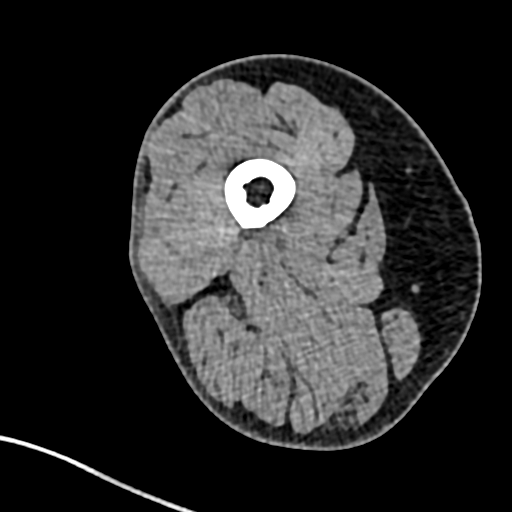
[im 89/384  soft-tissue]
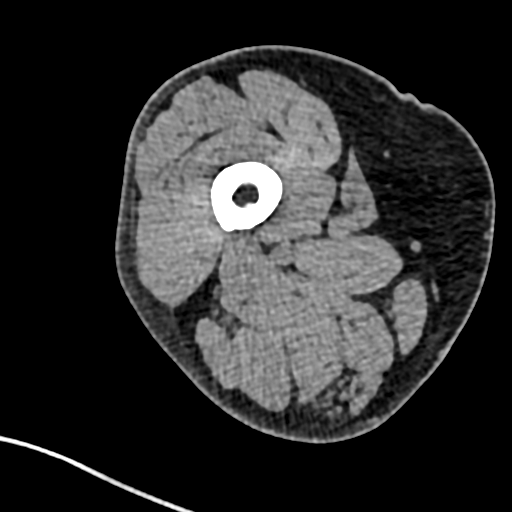
[im 118/384  soft-tissue]
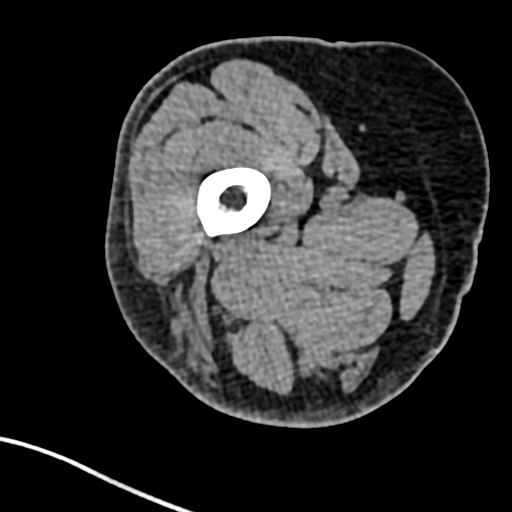
[im 163/384  soft-tissue]
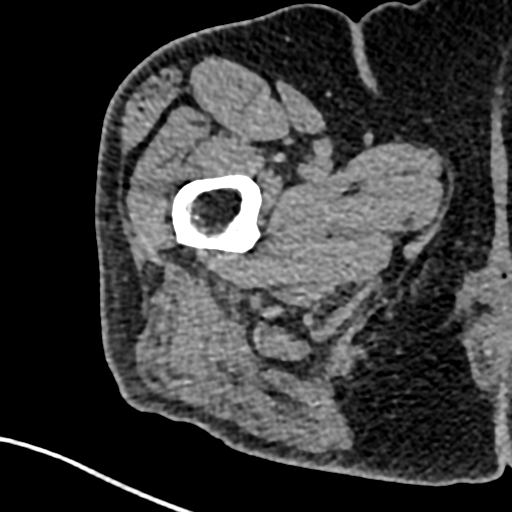
[im 192/384  soft-tissue]
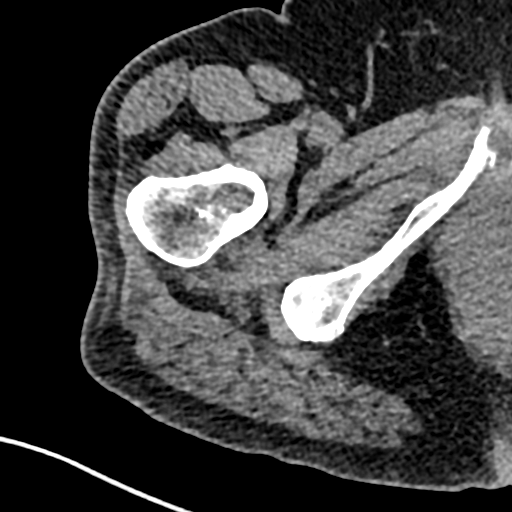
[im 221/384  soft-tissue]
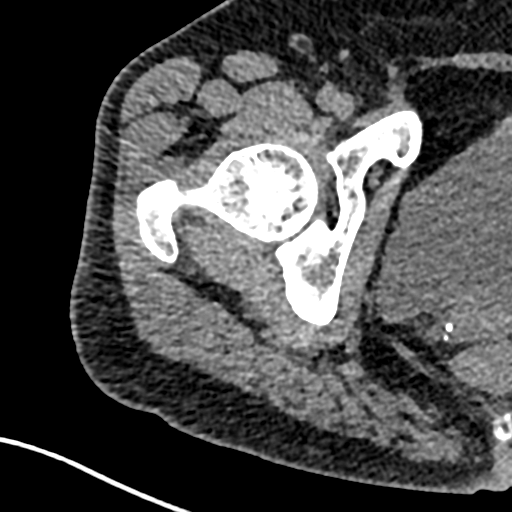
[im 266/384  soft-tissue]
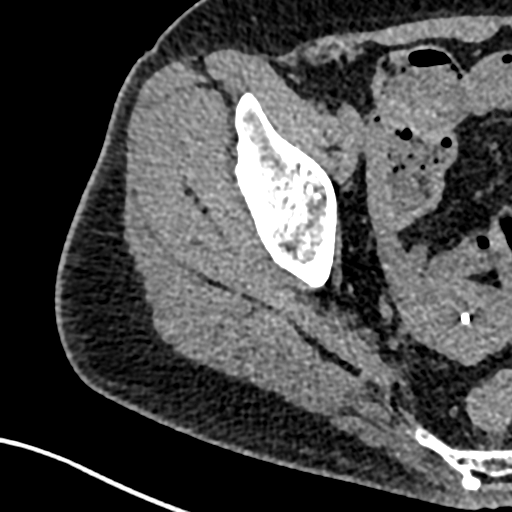
[im 295/384  soft-tissue]
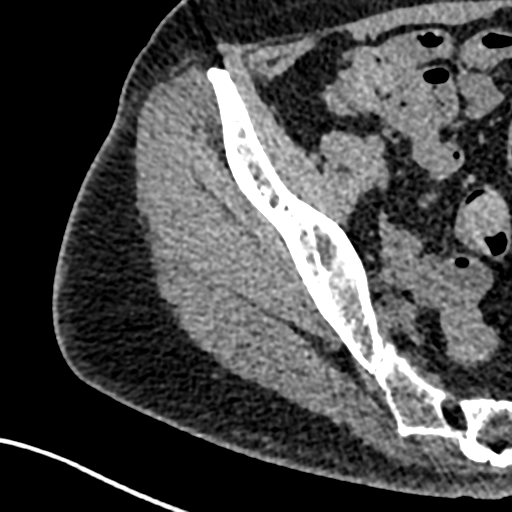
[im 295/384  bone]
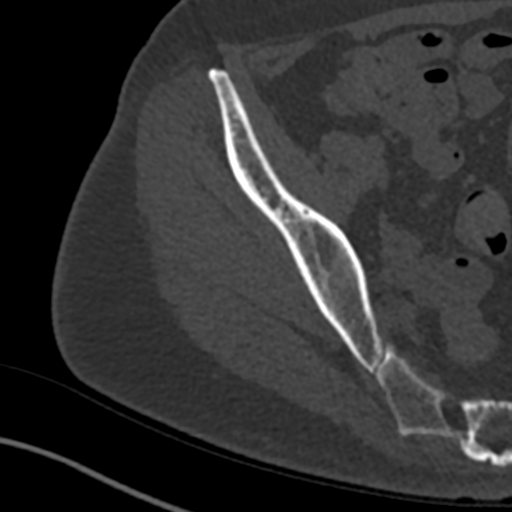
[im 325/384  soft-tissue]
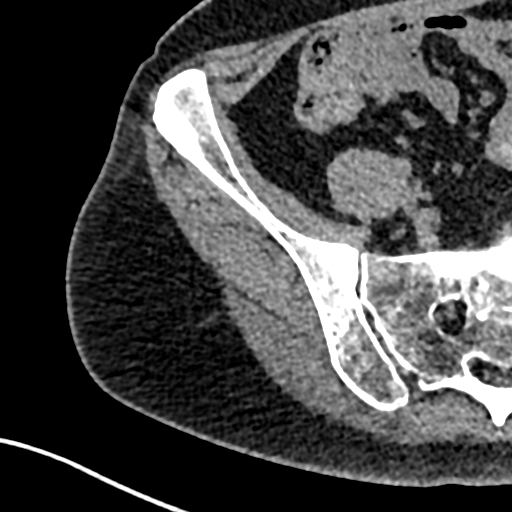
[im 354/384  soft-tissue]
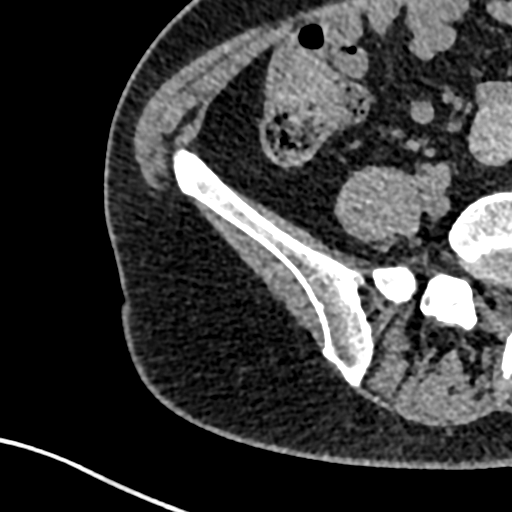

[Series 5: r hip hd fov 2.00 cor · coronal · 0.34mm/px · 3 of 128 slices shown]
[im 32/128  soft-tissue]
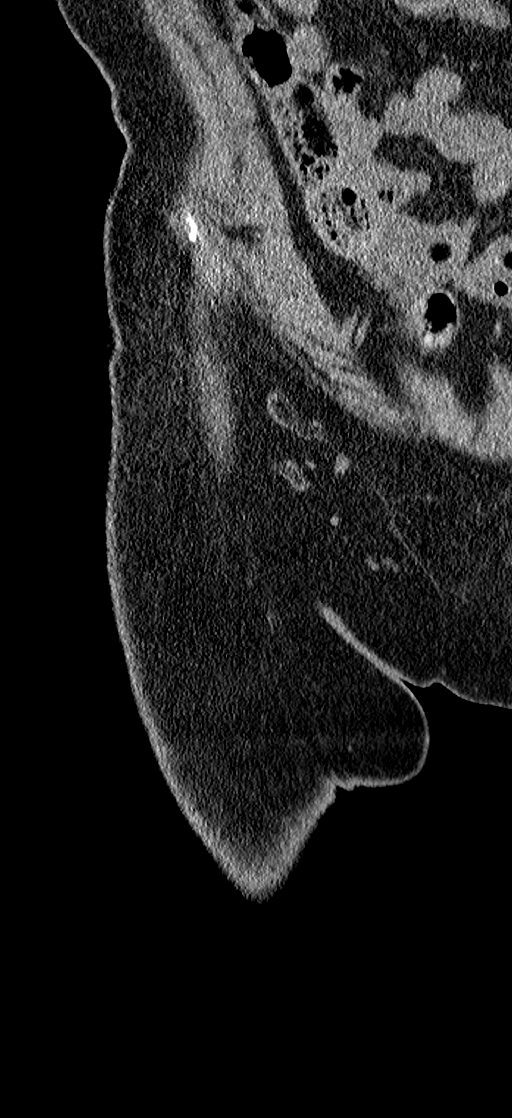
[im 64/128  soft-tissue]
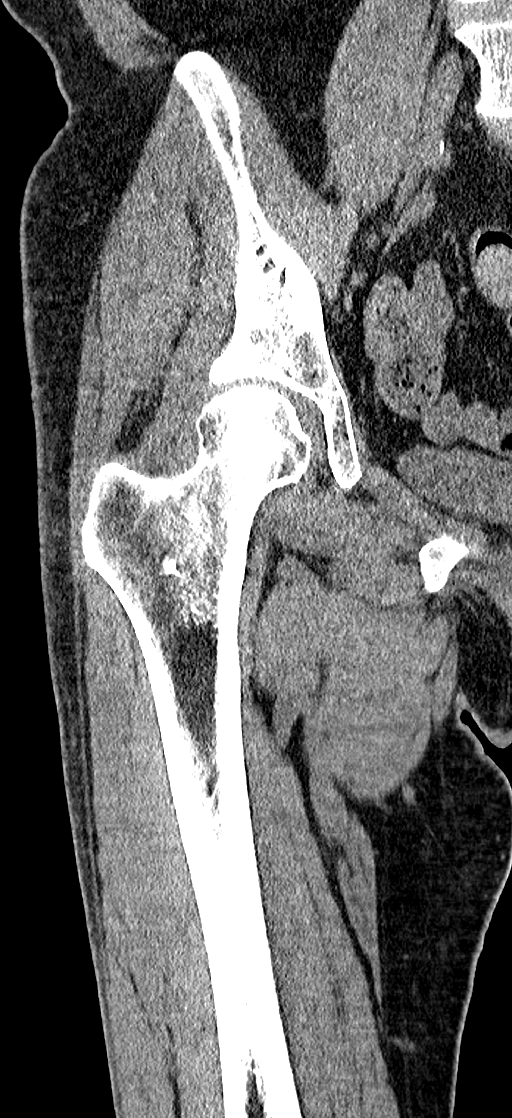
[im 96/128  soft-tissue]
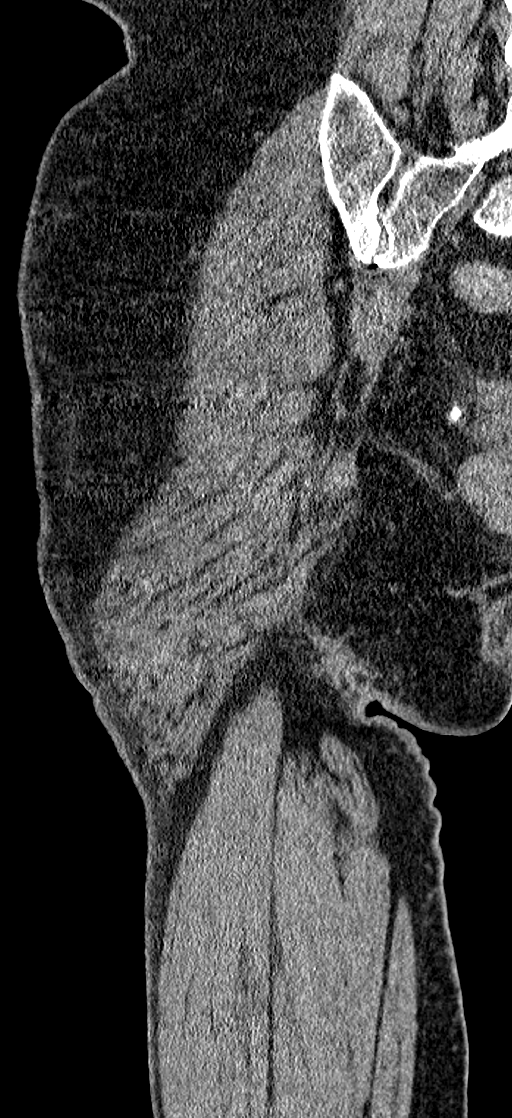

[14 of 46 positions shown; findings below may reference images not displayed]

FINDINGS: Bones/Joint/Cartilage

No fracture or dislocation. Normal alignment. No joint effusion. No
aggressive osseous lesion.

Ligaments

Ligaments are suboptimally evaluated by CT.

Muscles and Tendons
Muscles are normal. No muscle atrophy. No intramuscular fluid
collection or hematoma.

Soft tissue
No fluid collection or hematoma. No soft tissue mass. T-type IUD
noted in the uterus .
IMPRESSION: 1. No acute osseous injury of the right hip.

## 2022-08-30 ENCOUNTER — Telehealth: Payer: Self-pay | Admitting: *Deleted

## 2022-08-30 ENCOUNTER — Ambulatory Visit: Payer: BC Managed Care – PPO | Admitting: Physical Therapy

## 2022-08-30 LAB — CULTURE, URINE COMPREHENSIVE

## 2022-08-30 MED ORDER — NITROFURANTOIN MACROCRYSTAL 100 MG PO CAPS: 100.0000 mg | ORAL_CAPSULE | Freq: Two times a day (BID) | ORAL | 0 refills | Status: AC

## 2022-08-30 NOTE — Telephone Encounter (Signed)
Spoke with patient and advised results rx sent to pharmacy by e-script  

## 2022-08-30 NOTE — Telephone Encounter (Signed)
-----   Message from Debroah Loop, [place] sent at 08/30/2022  4:39 PM EDT ----- Her urine culture did grow bacteria. Let's treat with Macrobid 100mg  BID x5 days.

## 2022-09-01 ENCOUNTER — Ambulatory Visit: Payer: BC Managed Care – PPO | Admitting: Physical Therapy

## 2022-09-01 ENCOUNTER — Ambulatory Visit (HOSPITAL_BASED_OUTPATIENT_CLINIC_OR_DEPARTMENT_OTHER): Payer: BC Managed Care – PPO | Admitting: Orthopaedic Surgery

## 2022-09-03 LAB — MYCOPLASMA / UREAPLASMA CULTURE

## 2022-09-06 ENCOUNTER — Ambulatory Visit: Payer: BC Managed Care – PPO | Admitting: Physical Therapy

## 2022-09-09 ENCOUNTER — Ambulatory Visit: Payer: BC Managed Care – PPO | Admitting: Physical Therapy

## 2022-09-14 ENCOUNTER — Ambulatory Visit: Payer: BC Managed Care – PPO | Attending: Orthopaedic Surgery | Admitting: Physical Therapy

## 2022-09-14 DIAGNOSIS — M25551 Pain in right hip: Secondary | ICD-10-CM | POA: Diagnosis not present

## 2022-09-14 DIAGNOSIS — R262 Difficulty in walking, not elsewhere classified: Secondary | ICD-10-CM | POA: Diagnosis not present

## 2022-09-14 NOTE — Therapy (Signed)
OUTPATIENT PHYSICAL THERAPY TREATMENT NOTE   Patient Name: Lori [name] MRN: [id number] DOB:02/18/1976, 47 y.o., female Today's Date: 09/14/2022  PCP: Dr. [name]  REFERRING PROVIDER: Dr. Huel Cote   END OF SESSION:   PT End of Session - 09/14/22 1335     Visit Number 4    Number of Visits 20    Date for PT Re-Evaluation 10/21/22    Authorization Type [employer] [id number] VL [id number]    Authorization - Visit Number 4    Authorization - Number of Visits 20    Progress Note Due on Visit 10    PT Start Time 1330    PT Stop Time 1415    PT Time Calculation (min) 45 min    Activity Tolerance Patient tolerated treatment well    Behavior During Therapy [facility] for tasks assessed/performed              Past Medical History:  Diagnosis Date   Bacterial vaginitis    CHF (congestive heart failure) (HCC)    Colon polyps    Complication of anesthesia    nausea/vomiting   Depression    Diabetes mellitus without complication (HCC)    History of abnormal mammogram 2013   @[facility] - NEG MRI AND BX OCT 2014 NORMAL   History of mammogram 03/2015   BREAST MRI AT [facility] WNL   History of Papanicolaou smear of cervix 10/08/2013; 08/04/15   -/-; ASCUS/HPV NEG;   Hodgkin's disease (HCC) 1987   She had surgical resection of Lymph nodes and chemo + rad tx's.   Hypertension    Hypothyroidism    2ND TO CA TX   PONV (postoperative nausea and vomiting)    Restrictive lung disease    Vitamin D deficiency    Past Surgical History:  Procedure Laterality Date   CHOLECYSTECTOMY     DEEP NECK LYMPH NODE BIOPSY / EXCISION  1987   hodgkin lymphoma   DILATATION & CURETTAGE/HYSTEROSCOPY WITH MYOSURE N/A 03/10/2017   Procedure: DILATATION & CURETTAGE/HYSTEROSCOPY WITH MYOSURE;  Surgeon: Conard Novak, MD;  Location: [facility];  Service: Gynecology;  Laterality: N/A;   INTRAUTERINE DEVICE (IUD) INSERTION  [id number]; 02/06/09; 12/12/13   INTRAUTERINE DEVICE (IUD) INSERTION N/A 03/10/2017   Procedure: INTRAUTERINE  DEVICE (IUD) INSERTION;  Surgeon: Conard Novak, MD;  Location: [facility];  Service: Gynecology;  Laterality: N/A;   SPLENECTOMY     THYROIDECTOMY     Patient Active Problem List   Diagnosis Date Noted   Hypotension 05/13/2022   History of pyelonephritis 12/11/2021   Night sweats 10/12/2021   Depression, major, single episode, mild 10/12/2021   GAD (generalized anxiety disorder) 07/06/2021   Insomnia 07/06/2021   Tear of right acetabular labrum 07/06/2021   Mixed incontinence urge and stress 10/27/2020   Endometrial mass 01/18/2017   DUB (dysfunctional uterine bleeding) 01/18/2017   Bacterial vaginosis 01/18/2017   Allergic rhinitis 06/03/2015   Anxiety 06/03/2015   Acute systolic heart failure 06/03/2015   Clinical depression 06/03/2015   Diabetes mellitus, type 2 06/03/2015   Acid reflux 06/03/2015   History of biliary T-tube placement 06/03/2015   HD (Hodgkin's disease) 06/03/2015   RAD (reactive airway disease) 06/03/2015   Fast heart beat 06/03/2015   Chronic systolic heart failure 05/23/2014   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 She has been seeing various orthopedists and receiving joint injections. She has recently seen Dr. Huel Cote who gave her an injection which has helped somewhat    PRECAUTIONS: None   SUBJECTIVE:                                                                                                                                                                                      SUBJECTIVE STATEMENT:  Pt reports that she is able to do all of her exercises without  difficulty. She takes infrequent walks and she does not feel increased pain in her right hip.    PAIN:  Are you having pain? No   OBJECTIVE: (objective measures completed at initial evaluation unless otherwise dated)  VITALS: BP 118/72 HR 72 SpO2 100    DIAGNOSTIC FINDINGS:   CLINICAL DATA:  Chronic, progressively worsening right hip pain.   EXAM: MR OF THE RIGHT HIP WITHOUT CONTRAST   TECHNIQUE: Multiplanar, multisequence MR imaging was performed. No intravenous contrast was administered.   COMPARISON:  Right hip x-rays dated May 16, 2018.   FINDINGS: Bones: There is no evidence of acute fracture, dislocation or avascular necrosis. No focal bone lesion. The visualized sacroiliac joints and symphysis pubis appear normal.   Articular cartilage and labrum   Articular cartilage: No focal chondral defect or subchondral signal abnormality identified.   Labrum: Bulbous and irregular appearance of the right anterior superior labrum with increased signal, consistent with partial tear (series 6, image 11). No paralabral abnormality.   Joint or bursal effusion   Joint effusion: No significant hip joint effusion.   Bursae: No focal periarticular fluid collection.   Muscles and tendons   Muscles and tendons: The visualized gluteus, hamstring and iliopsoas tendons appear normal. No muscle edema or atrophy.   Other findings   Miscellaneous: The visualized internal pelvic contents appear unremarkable. IUD within the uterus.   IMPRESSION: 1. Partial tear of the right anterior superior labrum.     Electronically Signed   By: Obie Dredge M.D.   On: 04/10/2021 10:54   /////////////////////////////////////////////////////////////////////////////////////////////////////////////////  CLINICAL DATA:  Pain in right hip M25.551 (ICD-10-CM).   EXAM: MRI LUMBAR SPINE WITHOUT CONTRAST   TECHNIQUE: Multiplanar, multisequence MR imaging of the lumbar spine  was performed. No intravenous contrast was administered.   COMPARISON:  Radiographs May 26, 2022.   FINDINGS: Segmentation:  Standard.   Alignment:  Physiologic.   Vertebrae:  No fracture, evidence of discitis, or bone  lesion.   Conus medullaris and cauda equina: Conus extends to the L1 level. Conus and cauda equina appear normal.   Paraspinal and other soft tissues: Negative.   Disc levels:   T12-L1: No spinal canal or neural foraminal stenosis.   L1-2: No spinal canal or neural foraminal stenosis.   L2-3: No spinal canal or neural foraminal stenosis.   L3-4: Shallow disc bulge and mild facet degenerative changes. No significant spinal canal or neural foraminal stenosis.   L4-5: Disc bulge and mild facet degenerative changes resulting mild bilateral neural foraminal narrowing. No significant spinal canal stenosis.   L5-S1: No spinal canal or neural foraminal stenosis.   IMPRESSION: 1. Mild degenerative changes at L3-4 and L4-5. 2. Mild bilateral neural foraminal narrowing at L4-5. 3. No high-grade spinal canal or neural foraminal stenosis at any level.     Electronically Signed   By: Baldemar Lenis M.D.   On: 06/23/2022 15:38     PATIENT SURVEYS:  FOTO 72/100 with target of 79    COGNITION: Overall cognitive status: Within functional limits for tasks assessed                         SENSATION: WFL   MUSCLE LENGTH: Hamstrings: Right 80 deg; Left 90 deg Thomas test: Negative bilateral    POSTURE: No Significant postural limitations   PALPATION: Right glute med TTP    LOWER EXTREMITY ROM:   Active ROM Right eval Left eval  Hip flexion      Hip extension      Hip abduction      Hip adduction      Hip internal rotation 35 45  Hip external rotation 45 45  Knee flexion      Knee extension      Ankle dorsiflexion      Ankle plantarflexion      Ankle inversion      Ankle eversion       (Blank rows = not tested)             LOWER EXTREMITY MMT:   MMT Right eval Left eval  Hip flexion 5 5  Hip extension 4 4  Hip abduction 4-* 4  Hip adduction 4 4  Hip internal rotation      Hip external rotation      Knee flexion 5 5  Knee extension 5 5  Ankle dorsiflexion 5 5  Ankle plantarflexion      Ankle inversion      Ankle eversion       (Blank rows = not tested)   LOWER EXTREMITY SPECIAL TESTS:  Hip special tests: Luisa Hart (FABER) test: positive , Thomas test: negative, Ober's test: negative, Ely's test: negative, and Hip scouring test: negative Lumbar: Straight Leg Raise: Negative    FUNCTIONAL TESTS:  Squat: NT    GAIT: Not performed  Distance walked: NT  Assistive device utilized: None Level of assistance: Complete Independence Comments: Did not analyze gait      TODAY'S TREATMENT:  DATE:   09/14/22: Matrix Recumbent Bicycle with resistance 3 and seat 5 with resistance at 3 for 5 min  Side Step Down on 4 inch step 3 x 10  -min VC for heel tap  Mini-Squat to 25 inch mat height with 90 degree shoulder flexion 3 x 10  -min VC to maintain knee behind toes  Banded Side Step with Green Band 20 ft x 6  Standing Heel and Toe Raises with intermittent UE support 3 x 15   08/25/22: Matrix Recumbent Bicycle with resistance 3 and seat 5 for  5 min  Resisted side step with red band for 20 ft x 6  Lateral step down on 4 inch with  RLE with 1 UE support 3 x 10  OMEGA Leg Press #85 2 x 10 -Pt unable to perform third set due to muscle fatigue  Mini-Squat 3 x 10  -mod VC to shift weight onto heels by sitting on mat surface  08/17/22: TM at 2.0 mph for 5 min and BUE support  Standing Hip Abduction with BUE support on RLE 3 x 10  Standing Hip Abduction with BUE support on RLE #3 AW 3 x 10  Lateral Step Down on RLE with BUE support  on 4 inch step 3 x 10  Education on walking log and  start with 5 min and do 3x per week    08/12/22:  Isometric RLE Hip Abduction with 10 sec hold in side lying x 10 reps  Standing Right Hip Abduction Against wall 5 x 30 sec      PATIENT EDUCATION:  Education details: form and technique for accurate performance of exercise and explanation about underlying pathology  Person educated: Patient Education method: Explanation, Demonstration, Verbal cues, and Handouts Education comprehension: verbalized understanding, returned demonstration, and verbal cues required   HOME EXERCISE PROGRAM:   Access Code: [email or web address] URL: [email or web address] Date: 09/14/2022 Prepared by: Ellin Goodieaniel Erminie Foulks  Exercises - Side Step Down with Counter Support  - 3 x weekly - 3 sets - 10 reps - Side Stepping with Resistance at Ankles  - 3 x weekly - 1 sets - 10 reps - Mini Squat  - 3 x weekly - 3 sets - 10 reps - Standing Quadriceps Stretch  - 1 x daily - 3 reps - 30 sec  hold - Heel Toe Raises with Counter Support  - 3 x weekly - 3 sets - 15 reps   ASSESSMENT:   CLINICAL IMPRESSION: Pt shows improved hip strength with ability to perform LE strengthening exercises with increased resistance and decreased UE support. She did not experience any increase in her right hip pain during session despite increase in resistance. She will continue to benefit from skilled PT to address aforementioned deficits to return to completing ADLs with less pain and difficulty.  OBJECTIVE IMPAIRMENTS: Abnormal gait, decreased strength, and pain.    ACTIVITY LIMITATIONS: carrying, lifting, bending, sitting, standing, squatting, and stairs   PARTICIPATION LIMITATIONS: community activity and occupation   PERSONAL FACTORS: Time since onset of injury/illness/exacerbation are also affecting patient's functional outcome.    REHAB POTENTIAL: Fair chronicity of right hip pain along with partial labral tear    CLINICAL DECISION MAKING: Stable/uncomplicated   EVALUATION  COMPLEXITY: Low     GOALS: Goals reviewed with patient? No   SHORT TERM GOALS: Target date: 08/26/2022   Pt will be independent with HEP in order to improve strength and balance in order to decrease fall risk and improve function at home and  work. Baseline: Performing exercises independently  Goal status: Ongoing    LONG TERM GOALS: Target date: 10/21/2022   Patient will have improved function and activity level as evidenced by an increase in FOTO score by 10 points or more.  Baseline: 72/100  Goal status: Ongoing    2.  Patient will improve right hip strength to be symmetrical to left hip for improve LE function.  Baseline: Hip Abduction R/L 4-/4 Goal status: Ongoing    3.  Patient will be able to perform single leg stance on right without hip drop as evidence of improved glute med strength for improve LE function.  Baseline: No hip drop at baseline  Goal status: Deferred   4. Patient will be able to ambulate without hip drop on right hip as evidence of improve LE function.   Baseline: Right hip drop noted with RLE as stance leg  Goal status: Ongoing      PLAN:   PT FREQUENCY: 1-2x/week   PT DURATION: 10 weeks   PLANNED INTERVENTIONS: Therapeutic exercises, Neuromuscular re-education, Balance training, Gait training, Patient/Family education, Self Care, Joint mobilization, Joint manipulation, Aquatic Therapy, Dry Needling, Electrical stimulation, Cryotherapy, Moist heat, Manual therapy, and Re-evaluation   PLAN FOR NEXT SESSION:  Progress loading of glutes: Standing hip abduction with decreased UE support, Leg Press with increased resistance, lunges.     Ellin Goodie PT, DPT  09/14/2022, 1:35 PM

## 2022-09-15 ENCOUNTER — Other Ambulatory Visit: Payer: BC Managed Care – PPO | Admitting: Urology

## 2022-09-16 ENCOUNTER — Ambulatory Visit: Payer: BC Managed Care – PPO | Admitting: Physical Therapy

## 2022-09-21 ENCOUNTER — Ambulatory Visit: Payer: BC Managed Care – PPO | Admitting: Physical Therapy

## 2022-09-21 DIAGNOSIS — R262 Difficulty in walking, not elsewhere classified: Secondary | ICD-10-CM | POA: Diagnosis not present

## 2022-09-21 DIAGNOSIS — M25551 Pain in right hip: Secondary | ICD-10-CM

## 2022-09-21 NOTE — Therapy (Signed)
OUTPATIENT PHYSICAL THERAPY TREATMENT NOTE   Patient Name: Lori [name] MRN: [id number] DOB:01/19/1976, 47 y.o., female Today's Date: 09/21/2022  PCP: Dr. [name]  REFERRING PROVIDER: Dr. Huel Cote   END OF SESSION:   PT End of Session - 09/21/22 1430     Visit Number 5    Number of Visits 20    Date for PT Re-Evaluation 10/21/22    Authorization Type BCBS [id number] VL 30    Authorization - Visit Number 5    Authorization - Number of Visits 20    Progress Note Due on Visit 10    Activity Tolerance Patient tolerated treatment well    Behavior During Therapy [facility] for tasks assessed/performed               Past Medical History:  Diagnosis Date   Bacterial vaginitis    CHF (congestive heart failure) (HCC)    Colon polyps    Complication of anesthesia    nausea/vomiting   Depression    Diabetes mellitus without complication (HCC)    History of abnormal mammogram 2013   - NEG MRI AND BX OCT 2014 NORMAL   History of mammogram 03/2015   BREAST MRI AT [facility] WNL   History of Papanicolaou smear of cervix 10/08/2013; 08/04/15   -/-; ASCUS/HPV NEG;   Hodgkin's disease (HCC) 1987   She had surgical resection of Lymph nodes and chemo + rad tx's.   Hypertension    Hypothyroidism    2ND TO CA TX   PONV (postoperative nausea and vomiting)    Restrictive lung disease    Vitamin D deficiency    Past Surgical History:  Procedure Laterality Date   CHOLECYSTECTOMY     DEEP NECK LYMPH NODE BIOPSY / EXCISION  1987   hodgkin lymphoma   DILATATION & CURETTAGE/HYSTEROSCOPY WITH MYOSURE N/A 03/10/2017   Procedure: DILATATION & CURETTAGE/HYSTEROSCOPY WITH MYOSURE;  Surgeon: Conard Novak, MD;  Location: [facility] ORS;  Service: Gynecology;  Laterality: N/A;   INTRAUTERINE DEVICE (IUD) INSERTION  [id number]; 02/06/09; 12/12/13   INTRAUTERINE DEVICE (IUD) INSERTION N/A 03/10/2017   Procedure: INTRAUTERINE DEVICE (IUD) INSERTION;  Surgeon: Conard Novak, MD;  Location: [facility] ORS;   Service: Gynecology;  Laterality: N/A;   SPLENECTOMY     THYROIDECTOMY     Patient Active Problem List   Diagnosis Date Noted   Hypotension 05/13/2022   History of pyelonephritis 12/11/2021   Night sweats 10/12/2021   Depression, major, single episode, mild 10/12/2021   GAD (generalized anxiety disorder) 07/06/2021   Insomnia 07/06/2021   Tear of right acetabular labrum 07/06/2021   Mixed incontinence urge and stress 10/27/2020   Endometrial mass 01/18/2017   DUB (dysfunctional uterine bleeding) 01/18/2017   Bacterial vaginosis 01/18/2017   Allergic rhinitis 06/03/2015   Anxiety 06/03/2015   Acute systolic heart failure 06/03/2015   Clinical depression 06/03/2015   Diabetes mellitus, type 2 06/03/2015   Acid reflux 06/03/2015   History of biliary T-tube placement 06/03/2015   HD (Hodgkin's disease) 06/03/2015   RAD (reactive airway disease) 06/03/2015   Fast heart beat 06/03/2015   Chronic systolic heart failure 05/23/2014   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  Rationale for Evaluation and Treatment Rehabilitation  PERTINENT HISTORY: Pt reports that she has been dealing with right hip pain for the better part of four to  five years. She has been seeing various orthopedists and receiving joint injections. She has recently seen Dr. Huel Cote who gave her an injection which has helped somewhat    PRECAUTIONS: None   SUBJECTIVE:                                                                                                                                                                                      SUBJECTIVE STATEMENT:  Pt states that she has been able to do all exercises without difficulty and she had any pain flares.     PAIN:  Are you having pain? No   OBJECTIVE: (objective measures completed at initial  evaluation unless otherwise dated)  VITALS: BP 118/72 HR 72 SpO2 100    DIAGNOSTIC FINDINGS:   CLINICAL DATA:  Chronic, progressively worsening right hip pain.   EXAM: MR OF THE RIGHT HIP WITHOUT CONTRAST   TECHNIQUE: Multiplanar, multisequence MR imaging was performed. No intravenous contrast was administered.   COMPARISON:  Right hip x-rays dated May 16, 2018.   FINDINGS: Bones: There is no evidence of acute fracture, dislocation or avascular necrosis. No focal bone lesion. The visualized sacroiliac joints and symphysis pubis appear normal.   Articular cartilage and labrum   Articular cartilage: No focal chondral defect or subchondral signal abnormality identified.   Labrum: Bulbous and irregular appearance of the right anterior superior labrum with increased signal, consistent with partial tear (series 6, image 11). No paralabral abnormality.   Joint or bursal effusion   Joint effusion: No significant hip joint effusion.   Bursae: No focal periarticular fluid collection.   Muscles and tendons   Muscles and tendons: The visualized gluteus, hamstring and iliopsoas tendons appear normal. No muscle edema or atrophy.   Other findings   Miscellaneous: The visualized internal pelvic contents appear unremarkable. IUD within the uterus.   IMPRESSION: 1. Partial tear of the right anterior superior labrum.     Electronically Signed   By: Obie Dredge M.D.   On: 04/10/2021 10:54   /////////////////////////////////////////////////////////////////////////////////////////////////////////////////  CLINICAL DATA:  Pain in right hip M25.551 (ICD-10-CM).   EXAM: MRI LUMBAR SPINE WITHOUT CONTRAST   TECHNIQUE: Multiplanar, multisequence MR imaging of the lumbar spine was performed. No intravenous contrast was administered.   COMPARISON:  Radiographs May 26, 2022.   FINDINGS: Segmentation:  Standard.   Alignment:  Physiologic.   Vertebrae:  No  fracture, evidence of discitis, or bone lesion.   Conus medullaris and cauda equina: Conus extends to the L1 level. Conus and cauda equina appear normal.   Paraspinal and other soft tissues: Negative.   Disc levels:   T12-L1: No spinal canal or  neural foraminal stenosis.   L1-2: No spinal canal or neural foraminal stenosis.   L2-3: No spinal canal or neural foraminal stenosis.   L3-4: Shallow disc bulge and mild facet degenerative changes. No significant spinal canal or neural foraminal stenosis.   L4-5: Disc bulge and mild facet degenerative changes resulting mild bilateral neural foraminal narrowing. No significant spinal canal stenosis.   L5-S1: No spinal canal or neural foraminal stenosis.   IMPRESSION: 1. Mild degenerative changes at L3-4 and L4-5. 2. Mild bilateral neural foraminal narrowing at L4-5. 3. No high-grade spinal canal or neural foraminal stenosis at any level.     Electronically Signed   By: Baldemar Lenis M.D.   On: 06/23/2022 15:38     PATIENT SURVEYS:  FOTO 72/100 with target of 79    COGNITION: Overall cognitive status: Within functional limits for tasks assessed                         SENSATION: WFL   MUSCLE LENGTH: Hamstrings: Right 80 deg; Left 90 deg Thomas test: Negative bilateral    POSTURE: No Significant postural limitations   PALPATION: Right glute med TTP    LOWER EXTREMITY ROM:   Active ROM Right eval Left eval  Hip flexion      Hip extension      Hip abduction      Hip adduction      Hip internal rotation 35 45  Hip external rotation 45 45  Knee flexion      Knee extension      Ankle dorsiflexion      Ankle plantarflexion      Ankle inversion      Ankle eversion       (Blank rows = not tested)            LOWER EXTREMITY MMT:   MMT Right eval Left eval  Hip flexion 5 5  Hip extension 4 4  Hip abduction 4-* 4  Hip adduction 4 4  Hip internal rotation      Hip external rotation       Knee flexion 5 5  Knee extension 5 5  Ankle dorsiflexion 5 5  Ankle plantarflexion      Ankle inversion      Ankle eversion       (Blank rows = not tested)   LOWER EXTREMITY SPECIAL TESTS:  Hip special tests: Luisa Hart (FABER) test: positive , Thomas test: negative, Ober's test: negative, Ely's test: negative, and Hip scouring test: negative Lumbar: Straight Leg Raise: Negative    FUNCTIONAL TESTS:  Squat: NT    GAIT: Not performed  Distance walked: NT  Assistive device utilized: None Level of assistance: Complete Independence Comments: Did not analyze gait      TODAY'S TREATMENT:  DATE:   09/21/22: Matrix Recumbent Bicycle with resistance 3 and seat 9 for 5 min  OMEGA Leg Press #85 3 x 10 Mini-Squat with #8 lb water jug 3 x 10  Forward Lunges 25 ft 1 x 10  -min VC to maintain hips from dropping to side  09/14/22: Matrix Recumbent Bicycle with resistance 3 and seat 5 with resistance at 3 for 5 min  Side Step Down on 4 inch step 3 x 10  -min VC for heel tap  Mini-Squat to 25 inch mat height with 90 degree shoulder flexion 3 x 10  -min VC to maintain knee behind toes  Banded Side Step with Green Band 20 ft x 6  Standing Heel and Toe Raises with intermittent UE support 3 x 15   08/25/22: Matrix Recumbent Bicycle with resistance 3 and seat 5 for  5 min  Resisted side step with red band for 20 ft x 6  Lateral step down on 4 inch with  RLE with 1 UE support 3 x 10  OMEGA Leg Press #85 2 x 10 -Pt unable to perform third set due to muscle fatigue  Mini-Squat 3 x 10  -mod VC to shift weight onto heels by sitting on mat surface   PATIENT EDUCATION:  Education details: form and technique for accurate performance of exercise and explanation about underlying pathology  Person educated: Patient Education method: Explanation, Demonstration, Verbal cues, and  Handouts Education comprehension: verbalized understanding, returned demonstration, and verbal cues required   HOME EXERCISE PROGRAM:  Access Code: [email or web address] URL: [email or web address] Date: 09/21/2022 Prepared by: Ellin Goodie  Exercises - Side Step Down with Counter Support  - 3 x weekly - 3 sets - 10 reps - Side Stepping with Resistance at Ankles  - 3 x weekly - 1 sets - 10 reps - Mini Squat  - 3 x weekly - 3 sets - 10 reps - Standing Quadriceps Stretch  - 1 x daily - 3 reps - 30 sec  hold - Heel Toe Raises with Counter Support  - 3 x weekly - 3 sets - 15 reps - Standard Lunge  - 3 x weekly - 1 sets - 10 reps   ASSESSMENT:   CLINICAL IMPRESSION: Pt continues to show improved hip strength with ability to perform forward lunges and improved perception of hip function. She continues to not experience any increased pain in right hip during exercises. She will continue to benefit from skilled PT to address aforementioned deficits to return to completing ADLs with less pain and difficulty.   OBJECTIVE IMPAIRMENTS: Abnormal gait, decreased strength, and pain.    ACTIVITY LIMITATIONS: carrying, lifting, bending, sitting, standing, squatting, and stairs   PARTICIPATION LIMITATIONS: community activity and occupation   PERSONAL FACTORS: Time since onset of injury/illness/exacerbation are also affecting patient's functional outcome.    REHAB POTENTIAL: Fair chronicity of right hip pain along with partial labral tear    CLINICAL DECISION MAKING: Stable/uncomplicated   EVALUATION COMPLEXITY: Low     GOALS: Goals reviewed with patient? No   SHORT TERM GOALS: Target date: 08/26/2022   Pt will be independent with HEP in order to improve strength and balance in order to decrease fall risk and improve function at home and work. Baseline: Performing exercises independently  Goal status: Ongoing    LONG TERM GOALS: Target date: 10/21/2022   Patient will have improved  function and activity level as evidenced by an increase in FOTO score by 10 points or more.  Baseline: 72/100  Goal status: Ongoing    2.  Patient will improve right hip strength to be symmetrical to left hip for improve LE function.  Baseline: Hip Abduction R/L 4-/4 Goal status: Ongoing    3.  Patient will be able to perform single leg stance on right without hip drop as evidence of improved glute med strength for improve LE function.  Baseline: No hip drop at baseline  Goal status: Deferred   4. Patient will be able to ambulate without hip drop on right hip as evidence of improve LE function.   Baseline: Right hip drop noted with RLE as stance leg  Goal status: Ongoing      PLAN:   PT FREQUENCY: 1-2x/week   PT DURATION: 10 weeks   PLANNED INTERVENTIONS: Therapeutic exercises, Neuromuscular re-education, Balance training, Gait training, Patient/Family education, Self Care, Joint mobilization, Joint manipulation, Aquatic Therapy, Dry Needling, Electrical stimulation, Cryotherapy, Moist heat, Manual therapy, and Re-evaluation   PLAN FOR NEXT SESSION:  Progress loading of glutes: Side step down with increased resistance and leg Press with increased resistance     Ellin Goodie PT, DPT  09/21/2022, 2:52 PM

## 2022-09-22 ENCOUNTER — Ambulatory Visit (HOSPITAL_BASED_OUTPATIENT_CLINIC_OR_DEPARTMENT_OTHER): Payer: BC Managed Care – PPO | Admitting: Orthopaedic Surgery

## 2022-09-23 ENCOUNTER — Ambulatory Visit: Payer: BC Managed Care – PPO | Admitting: Physical Therapy

## 2022-09-24 ENCOUNTER — Other Ambulatory Visit: Payer: Self-pay | Admitting: Physician Assistant

## 2022-09-24 ENCOUNTER — Ambulatory Visit (INDEPENDENT_AMBULATORY_CARE_PROVIDER_SITE_OTHER): Payer: BC Managed Care – PPO | Admitting: Urology

## 2022-09-24 ENCOUNTER — Encounter: Payer: Self-pay | Admitting: Urology

## 2022-09-24 VITALS — BP 106/69 | HR 91 | Ht 66.0 in | Wt 150.0 lb

## 2022-09-24 DIAGNOSIS — N390 Urinary tract infection, site not specified: Secondary | ICD-10-CM | POA: Diagnosis not present

## 2022-09-24 DIAGNOSIS — F411 Generalized anxiety disorder: Secondary | ICD-10-CM

## 2022-09-24 LAB — URINALYSIS, COMPLETE
Bilirubin, UA: NEGATIVE
Ketones, UA: NEGATIVE
Leukocytes,UA: NEGATIVE
Nitrite, UA: NEGATIVE
Protein,UA: NEGATIVE
RBC, UA: NEGATIVE
Specific Gravity, UA: <1.005 — ABNORMAL LOW (ref 1.005–1.030)
Urobilinogen, Ur: 0.2 mg/dL (ref 0.2–1.0)
pH, UA: 5.5 (ref 5.0–7.5)

## 2022-09-24 LAB — MICROSCOPIC EXAMINATION

## 2022-09-24 MED ORDER — NITROFURANTOIN MACROCRYSTAL 50 MG PO CAPS: 50.0000 mg | ORAL_CAPSULE | Freq: Every day | ORAL | 2 refills | Status: DC

## 2022-09-24 MED ORDER — ESTRADIOL 0.1 MG/GM VA CREA: TOPICAL_CREAM | VAGINAL | 1 refills | Status: DC

## 2022-09-24 NOTE — Telephone Encounter (Signed)
Requested medication (s) are due for refill today: yes  Requested medication (s) are on the active medication list: yes  Last refill:  02/23/22 #45/1  Future visit scheduled: no  Notes to clinic:  Unable to refill per protocol, cannot delegate.    Requested Prescriptions  Pending Prescriptions Disp Refills   ALPRAZolam (XANAX) 0.5 MG tablet [Pharmacy Med Name: ALPRAZOLAM 0.5 MG TABLET] 45 tablet     Sig: TAKE 1 TABLET BY MOUTH AT BEDTIME AS NEEDED FOR ANXIETY AND 1/2 TAB DURING THE DAY FOR PANIC ATTACKS. DO NOT TAKE WITH MUSCLE RELAXANTS     Not Delegated - Psychiatry: Anxiolytics/Hypnotics 2 Failed - 09/24/2022  2:55 PM      Failed - This refill cannot be delegated      Failed - Urine Drug Screen completed in last 360 days      Passed - Patient is not pregnant      Passed - Valid encounter within last 6 months    Recent Outpatient Visits           2 months ago Acute cystitis with hematuria   [place] [facility] [name], PA-C   4 months ago Depression, major, single episode, mild [facility])   [place] [facility] [name], PA-C   9 months ago Dysuria   [place] [facility] [place], [place], PA-C   9 months ago History of pyelonephritis   [name], PA-C   10 months ago Urinary urgency   [place] [facility] [name], PA-C       Future Appointments             In 3 weeks Huel Cote, MD [facility] at [place], [place]   In 3 months Carman Ching, [facility] [place]

## 2022-09-24 NOTE — Patient Instructions (Signed)
D-mannose  Cranberry

## 2022-09-24 NOTE — Progress Notes (Signed)
   09/24/22  CC:  Chief Complaint  Patient presents with   Cysto    HPI: Refer to Sam Vaillancourt's previous office note 08/26/2022.  Urine culture at that visit was positive for E. coli.  She is perimenopausal.  Had positive culture in mid February  Blood pressure 106/69, pulse 91, height  (1.676 m), weight 150 lb (68 kg). NED. A&Ox3.   No respiratory distress   Abd soft, NT, ND Normal external genitalia with patent urethral meatus  Cystoscopy Procedure Note  Patient identification was confirmed, informed consent was obtained, and patient was prepped using Betadine solution.  Lidocaine jelly was administered per urethral meatus.    Procedure: - Flexible cystoscope introduced, without any difficulty.   - Thorough search of the bladder revealed:    normal urethral meatus    normal urothelium    no stones    no ulcers     no tumors    no urethral polyps    no trabeculation  - Ureteral orifices were normal in position and appearance.  Post-Procedure: - Patient tolerated the procedure well  Assessment/ Plan: No abnormalities noted on cystoscopy She is perimenopausal and discussed low-dose vaginal estrogen which she is interested in starting.  Instructed to apply a pea-sized amount by fingertip to the anterior vaginal wall twice weekly 90-day course nitrofurantoin 50 mg daily then discontinue PA follow-up 3 months and instructed to call earlier for recurrent UTI symptoms    Riki Altes, MD

## 2022-09-27 ENCOUNTER — Ambulatory Visit: Payer: BC Managed Care – PPO | Admitting: Physical Therapy

## 2022-09-29 DIAGNOSIS — G43719 Chronic migraine without aura, intractable, without status migrainosus: Secondary | ICD-10-CM | POA: Diagnosis not present

## 2022-09-29 DIAGNOSIS — Z79899 Other long term (current) drug therapy: Secondary | ICD-10-CM | POA: Diagnosis not present

## 2022-10-06 ENCOUNTER — Other Ambulatory Visit: Payer: Self-pay | Admitting: Physician Assistant

## 2022-10-20 ENCOUNTER — Ambulatory Visit (HOSPITAL_BASED_OUTPATIENT_CLINIC_OR_DEPARTMENT_OTHER): Payer: BC Managed Care – PPO | Admitting: Orthopaedic Surgery

## 2022-11-09 ENCOUNTER — Other Ambulatory Visit: Payer: Self-pay | Admitting: Nurse Practitioner

## 2022-11-09 DIAGNOSIS — C8110 Nodular sclerosis classical Hodgkin lymphoma, unspecified site: Secondary | ICD-10-CM

## 2022-11-09 DIAGNOSIS — T66XXXS Radiation sickness, unspecified, sequela: Secondary | ICD-10-CM

## 2022-11-15 ENCOUNTER — Ambulatory Visit: Payer: Self-pay

## 2022-11-15 NOTE — Telephone Encounter (Signed)
  Chief Complaint: Pt stated she was vomiting 7 days ago-nausea, headache, fatigue, lightheadedness Right now, symptoms include feeling drained, nausea, headaches, and fatigue. Pt did not want to see anyone other than PCP Lillia Abed went ahead and scheduled pt for tomorrow, June 11, at 10 a.m.  Symptoms: above Frequency: unsure Pertinent Negatives: Patient denies  Disposition: [] ED /[] Urgent Care (no appt availability in office) / [x] Appointment(In office/virtual)/ []  [place] [facility]/ [] Home Care/ [] Refused Recommended Disposition /[]  Mobile Bus/ []  Follow-up with PCP Additional Notes: Returned pt's call to triage.  Pt does not want triage. She will monitor s/s and come into the office tomorrow to be seen    Summary: drained, nausea, headaches, and fatigue.   Pt stated she was vomiting 7 days ago-nausea, headache, fatigue, lightheadedness Right now, symptoms include feeling drained, nausea, headaches, and fatigue. Pt did not want to see anyone other than PCP Lillia Abed went ahead and scheduled pt for tomorrow, June 11, at 10 a.m.  Seeking clinical advice.     Reason for Disposition  Requesting regular office appointment  Answer Assessment - Initial Assessment Questions 1. REASON FOR CALL or QUESTION: "What is your reason for calling today?" or "How can I best help you?" or "What question do you have that I can help answer?"     Returned pt's call to triage.  Pt does not want triage. She will monitor s/s and come into the office tomorrow to be seen.  Protocols used: Information Only Call - No Triage-A-AH

## 2022-11-15 NOTE — Progress Notes (Unsigned)
     I,[name],acting as a Neurosurgeon for [employer], PA-C.,have documented all relevant documentation on the behalf of Lori Ferguson, PA-C,as directed by  Lori Ferguson, PA-C while in the presence of Lori Ferguson, PA-C.   Established patient visit   Patient: Lori [name]   DOB: 03-17-1976   47 y.o. Female  MRN: [id number] Visit Date: 11/16/2022  Today's healthcare provider: [name], PA-C   No chief complaint on file.  Subjective    HPI  ***  Medications: Outpatient Medications Prior to Visit  Medication Sig   ALPRAZolam (XANAX) 0.5 MG tablet TAKE 1 TABLET BY MOUTH AT BEDTIME AS NEEDED FOR ANXIETY AND 1/2 TAB DURING THE DAY FOR PANIC ATTACKS. DO NOT TAKE WITH MUSCLE RELAXANTS   atorvastatin (LIPITOR) 40 MG tablet Take 1 tablet by mouth daily.   Continuous Blood Gluc Sensor (DEXCOM G6 SENSOR) MISC SMARTSIG:1 EACH TOPICAL EVERY 10 DAYS   Continuous Blood Gluc Transmit (DEXCOM G6 TRANSMITTER) MISC USE TO MONITOR BLOOD SUGARS. CHANGE EVERY 90 DAYS.   estradiol (ESTRACE) 0.1 MG/GM vaginal cream Apply a pea-sized amount to fingertip and wipe in vaginal introitus twice weekly   glimepiride (AMARYL) 4 MG tablet Take by mouth.   levonorgestrel (MIRENA) 20 MCG/24HR IUD 1 each by Intrauterine route once.   levothyroxine (SYNTHROID) 75 MCG tablet Take 75 mcg by mouth daily.   lisinopril (ZESTRIL) 5 MG tablet Take by mouth.   methocarbamol (ROBAXIN) 750 MG tablet Take 1 tablet by mouth 3 (three) times daily as needed for muscle spasms.   metoprolol succinate (TOPROL-XL) 50 MG 24 hr tablet TAKE 1 TABLET BY MOUTH EVERY DAY (Patient taking differently: Take 50 mg by mouth as needed.)   nitrofurantoin (MACRODANTIN) 50 MG capsule Take 1 capsule (50 mg total) by mouth daily.   pantoprazole (PROTONIX) 40 MG tablet TAKE 1 TABLET BY MOUTH EVERY DAY   sertraline (ZOLOFT) 50 MG tablet Take 1 tablet (50 mg total) by mouth daily.   SYNJARDY XR 12.10-998 MG TB24 Take 2 tablets by mouth  every morning.   traMADol (ULTRAM) 50 MG tablet Take 1 tablet (50 mg total) by mouth every 12 (twelve) hours as needed. (Patient taking differently: Take 50 mg by mouth as needed for moderate pain.)   No facility-administered medications prior to visit.    Review of Systems  {Labs  Heme  Chem  Endocrine  Serology  Results Review (optional):23779}   Objective    There were no vitals taken for this visit. {Show previous Mallery Harshman signs (optional):23777}  Physical Exam  ***  No results found for any visits on 11/16/22.  Assessment & Plan     ***  No follow-ups on file.      {provider attestation***:1}   Lori Ferguson, PA-C  [facility] [phone] (phone) [phone] (fax)  [facility]

## 2022-11-16 ENCOUNTER — Ambulatory Visit (INDEPENDENT_AMBULATORY_CARE_PROVIDER_SITE_OTHER): Payer: PRIVATE HEALTH INSURANCE | Admitting: Physician Assistant

## 2022-11-16 ENCOUNTER — Encounter: Payer: Self-pay | Admitting: Physician Assistant

## 2022-11-16 VITALS — BP 92/72 | HR 81 | Temp 97.6°F | Resp 14 | Ht 66.0 in | Wt 146.1 lb

## 2022-11-16 DIAGNOSIS — R519 Headache, unspecified: Secondary | ICD-10-CM

## 2022-11-16 DIAGNOSIS — R112 Nausea with vomiting, unspecified: Secondary | ICD-10-CM | POA: Diagnosis not present

## 2022-11-16 LAB — POCT URINALYSIS DIPSTICK
Bilirubin, UA: NEGATIVE
Blood, UA: NEGATIVE
Glucose, UA: POSITIVE — AB
Ketones, UA: NEGATIVE
Leukocytes, UA: NEGATIVE
Nitrite, UA: NEGATIVE
Protein, UA: NEGATIVE
Spec Grav, UA: 1.01 (ref 1.010–1.025)
Urobilinogen, UA: 0.2 E.U./dL
pH, UA: 6 (ref 5.0–8.0)

## 2022-11-16 MED ORDER — KETOROLAC TROMETHAMINE 30 MG/ML IJ SOLN
30.0000 mg | Freq: Once | INTRAMUSCULAR | Status: AC
  Administered 2022-11-16: 30 mg via INTRAMUSCULAR

## 2022-11-16 MED ORDER — KETOROLAC TROMETHAMINE 30 MG/ML IJ SOLN: 30.0000 mg | Freq: Once | INTRAMUSCULAR | Status: DC

## 2022-11-16 MED ORDER — METOCLOPRAMIDE HCL 5 MG PO TABS: 5.0000 mg | ORAL_TABLET | Freq: Three times a day (TID) | ORAL | 1 refills | Status: DC | PRN

## 2022-11-17 ENCOUNTER — Encounter: Payer: Self-pay | Admitting: Physician Assistant

## 2022-11-19 LAB — CBC WITH DIFFERENTIAL/PLATELET
Basophils Absolute: 0.1 10*3/uL (ref 0.0–0.2)
Basos: 1 %
EOS (ABSOLUTE): 0.1 10*3/uL (ref 0.0–0.4)
Eos: 1 %
Hematocrit: 41.1 % (ref 34.0–46.6)
Hemoglobin: 13.7 g/dL (ref 11.1–15.9)
Immature Grans (Abs): 0 10*3/uL (ref 0.0–0.1)
Immature Granulocytes: 0 %
Lymphocytes Absolute: 5 10*3/uL — ABNORMAL HIGH (ref 0.7–3.1)
Lymphs: 49 %
MCH: 28.2 pg (ref 26.6–33.0)
MCHC: 33.3 g/dL (ref 31.5–35.7)
MCV: 85 fL (ref 79–97)
Monocytes Absolute: 1 10*3/uL — ABNORMAL HIGH (ref 0.1–0.9)
Monocytes: 9 %
Neutrophils Absolute: 4.2 10*3/uL (ref 1.4–7.0)
Neutrophils: 40 %
Platelets: 491 10*3/uL — ABNORMAL HIGH (ref 150–450)
RBC: 4.85 x10E6/uL (ref 3.77–5.28)
RDW: 14 % (ref 11.7–15.4)
WBC: 10.4 10*3/uL (ref 3.4–10.8)

## 2022-11-19 LAB — COMPREHENSIVE METABOLIC PANEL
ALT: 20 IU/L (ref 0–32)
AST: 18 IU/L (ref 0–40)
Albumin/Globulin Ratio: 1.6
Albumin: 5 g/dL — ABNORMAL HIGH (ref 3.9–4.9)
Alkaline Phosphatase: 79 IU/L (ref 44–121)
BUN/Creatinine Ratio: 28 — ABNORMAL HIGH (ref 9–23)
BUN: 23 mg/dL (ref 6–24)
Bilirubin Total: 0.2 mg/dL (ref 0.0–1.2)
CO2: 17 mmol/L — ABNORMAL LOW (ref 20–29)
Calcium: 10.5 mg/dL — ABNORMAL HIGH (ref 8.7–10.2)
Chloride: 101 mmol/L (ref 96–106)
Creatinine, Ser: 0.83 mg/dL (ref 0.57–1.00)
Globulin, Total: 3.1 g/dL (ref 1.5–4.5)
Glucose: 147 mg/dL — ABNORMAL HIGH (ref 70–99)
Potassium: 5.3 mmol/L — ABNORMAL HIGH (ref 3.5–5.2)
Sodium: 139 mmol/L (ref 134–144)
Total Protein: 8.1 g/dL (ref 6.0–8.5)
eGFR: 87 mL/min/{1.73_m2} (ref >59)

## 2022-11-19 LAB — LIPASE: Lipase: 37 U/L (ref 14–72)

## 2022-11-30 ENCOUNTER — Other Ambulatory Visit: Payer: Self-pay | Admitting: Physician Assistant

## 2022-11-30 DIAGNOSIS — F320 Major depressive disorder, single episode, mild: Secondary | ICD-10-CM

## 2022-12-01 NOTE — Telephone Encounter (Signed)
Requested Prescriptions  Pending Prescriptions Disp Refills   sertraline (ZOLOFT) 50 MG tablet [Pharmacy Med Name: SERTRALINE HCL 50 MG TABLET] 90 tablet 0    Sig: TAKE 1 TABLET BY MOUTH EVERY DAY     Psychiatry:  Antidepressants - SSRI - sertraline Passed - 11/30/2022 10:48 AM      Passed - AST in normal range and within 360 days    AST  Date Value Ref Range Status  11/16/2022 18 0 - 40 IU/L Final   SGOT(AST)  Date Value Ref Range Status  04/10/2014 19 15 - 37 Unit/L Final         Passed - ALT in normal range and within 360 days    ALT  Date Value Ref Range Status  11/16/2022 20 0 - 32 IU/L Final   SGPT (ALT)  Date Value Ref Range Status  04/10/2014 32 U/L Final    Comment:    14-63 NOTE: New Reference Range 12/25/13          Passed - Completed PHQ-2 or PHQ-9 in the last 360 days      Passed - Valid encounter within last 6 months    Recent Outpatient Visits           2 weeks ago Nausea and vomiting, unspecified vomiting type   [facility] [name], PA-C   4 months ago Acute cystitis with hematuria   [place] [name], PA-C   6 months ago Depression, major, single episode, mild [facility])   [place] [facility] [name], PA-C   11 months ago Dysuria   [facility] [place], [place], PA-C   11 months ago History of pyelonephritis   [facility] [name], PA-C       Future Appointments             In 3 weeks Carman Ching, [facility] [place]

## 2022-12-02 ENCOUNTER — Encounter: Payer: Self-pay | Admitting: Physician Assistant

## 2022-12-15 ENCOUNTER — Ambulatory Visit (INDEPENDENT_AMBULATORY_CARE_PROVIDER_SITE_OTHER): Payer: PRIVATE HEALTH INSURANCE | Admitting: Physician Assistant

## 2022-12-15 ENCOUNTER — Encounter: Payer: Self-pay | Admitting: Physician Assistant

## 2022-12-15 VITALS — BP 97/69 | HR 82 | Temp 98.2°F | Resp 14 | Ht 66.0 in | Wt 146.4 lb

## 2022-12-15 DIAGNOSIS — M542 Cervicalgia: Secondary | ICD-10-CM

## 2022-12-15 DIAGNOSIS — S161XXA Strain of muscle, fascia and tendon at neck level, initial encounter: Secondary | ICD-10-CM

## 2022-12-15 MED ORDER — CYCLOBENZAPRINE HCL 5 MG PO TABS: 5.0000 mg | ORAL_TABLET | Freq: Three times a day (TID) | ORAL | 1 refills | Status: DC | PRN

## 2022-12-15 NOTE — Progress Notes (Unsigned)
      Established patient visit   Patient: Lori [name]   DOB: 07/01/1975   47 y.o. Female  MRN: [id number] Visit Date: 12/15/2022  Today's healthcare provider: [name], PA-C   No chief complaint on file.  Subjective    HPI   Right side of neck  Medications: Outpatient Medications Prior to Visit  Medication Sig   ALPRAZolam (XANAX) 0.5 MG tablet TAKE 1 TABLET BY MOUTH AT BEDTIME AS NEEDED FOR ANXIETY AND 1/2 TAB DURING THE DAY FOR PANIC ATTACKS. DO NOT TAKE WITH MUSCLE RELAXANTS   atorvastatin (LIPITOR) 40 MG tablet Take 1 tablet by mouth daily.   Continuous Blood Gluc Sensor (DEXCOM G6 SENSOR) MISC SMARTSIG:1 EACH TOPICAL EVERY 10 DAYS   Continuous Blood Gluc Transmit (DEXCOM G6 TRANSMITTER) MISC USE TO MONITOR BLOOD SUGARS. CHANGE EVERY 90 DAYS.   glimepiride (AMARYL) 4 MG tablet Take 2 mg by mouth.   levonorgestrel (MIRENA) 20 MCG/24HR IUD 1 each by Intrauterine route once.   levothyroxine (SYNTHROID) 75 MCG tablet Take 75 mcg by mouth daily.   lisinopril (ZESTRIL) 5 MG tablet Take by mouth.   metoCLOPramide (REGLAN) 5 MG tablet Take 1 tablet (5 mg total) by mouth 3 (three) times daily as needed for nausea.   metoprolol succinate (TOPROL-XL) 50 MG 24 hr tablet TAKE 1 TABLET BY MOUTH EVERY DAY (Patient taking differently: Take 50 mg by mouth as needed.)   nitrofurantoin (MACRODANTIN) 50 MG capsule Take 1 capsule (50 mg total) by mouth daily.   pantoprazole (PROTONIX) 40 MG tablet TAKE 1 TABLET BY MOUTH EVERY DAY   sertraline (ZOLOFT) 50 MG tablet TAKE 1 TABLET BY MOUTH EVERY DAY   SYNJARDY XR 12.10-998 MG TB24 Take 2 tablets by mouth every morning.   topiramate (TOPAMAX) 25 MG tablet Take 25 mg by mouth at bedtime.   No facility-administered medications prior to visit.    Review of Systems  {Labs  Heme  Chem  Endocrine  Serology  Results Review (optional):23779}   Objective    There were no vitals taken for this visit. {Show previous vital signs  (optional):23777}  Physical Exam Vitals reviewed.  Constitutional:      Appearance: She is not ill-appearing.  HENT:     Head: Normocephalic.  Eyes:     Conjunctiva/sclera: Conjunctivae normal.  Cardiovascular:     Rate and Rhythm: Normal rate.  Pulmonary:     Effort: Pulmonary effort is normal. No respiratory distress.  Musculoskeletal:     Comments: Tension to the L trap no edema, rash  Neurological:     General: No focal deficit present.     Mental Status: She is alert and oriented to person, place, and time.  Psychiatric:        Mood and Affect: Mood normal.        Behavior: Behavior normal.     ***  No results found for any visits on 12/15/22.  Assessment & Plan     ***  No follow-ups on file.      I, [name], PA-C have reviewed all documentation for this visit. The documentation on  12/15/22   for the exam, diagnosis, procedures, and orders are all accurate and complete.  [name], PA-C [facility] [address] [place], [place], [address] Office: [phone] Fax: [phone]   [facility]

## 2022-12-16 ENCOUNTER — Encounter: Payer: Self-pay | Admitting: Physician Assistant

## 2022-12-24 ENCOUNTER — Ambulatory Visit: Payer: PRIVATE HEALTH INSURANCE | Admitting: Physician Assistant

## 2022-12-25 ENCOUNTER — Ambulatory Visit
Admission: RE | Admit: 2022-12-25 | Discharge: 2022-12-25 | Disposition: A | Discharge status: Home / Self Care | Payer: Self-pay | Source: Ambulatory Visit | Attending: Nurse Practitioner | Admitting: Nurse Practitioner

## 2022-12-25 DIAGNOSIS — C8110 Nodular sclerosis classical Hodgkin lymphoma, unspecified site: Secondary | ICD-10-CM

## 2022-12-25 DIAGNOSIS — T66XXXS Radiation sickness, unspecified, sequela: Secondary | ICD-10-CM

## 2022-12-25 MED ORDER — GADOPICLENOL 0.5 MMOL/ML IV SOLN
7.0000 mL | Freq: Once | INTRAVENOUS | Status: AC | PRN
  Administered 2022-12-25: 7 mL via INTRAVENOUS

## 2023-01-06 LAB — PROTEIN / CREATININE RATIO, URINE
Albumin, U: <7
Creatinine, Urine: 35.6

## 2023-01-06 LAB — MICROALBUMIN / CREATININE URINE RATIO: Microalb Creat Ratio: <19.7

## 2023-01-07 ENCOUNTER — Other Ambulatory Visit: Payer: Self-pay | Admitting: Physician Assistant

## 2023-01-17 ENCOUNTER — Encounter: Payer: BC Managed Care – PPO | Admitting: Dermatology

## 2023-03-02 ENCOUNTER — Other Ambulatory Visit: Payer: Self-pay | Admitting: Physician Assistant

## 2023-03-02 DIAGNOSIS — F320 Major depressive disorder, single episode, mild: Secondary | ICD-10-CM

## 2023-03-10 ENCOUNTER — Ambulatory Visit (INDEPENDENT_AMBULATORY_CARE_PROVIDER_SITE_OTHER): Payer: PRIVATE HEALTH INSURANCE | Admitting: Family Medicine

## 2023-03-10 ENCOUNTER — Encounter: Payer: Self-pay | Admitting: Family Medicine

## 2023-03-10 VITALS — BP 108/59 | HR 83 | Ht 66.0 in | Wt 148.6 lb

## 2023-03-10 DIAGNOSIS — E119 Type 2 diabetes mellitus without complications: Secondary | ICD-10-CM

## 2023-03-10 DIAGNOSIS — F411 Generalized anxiety disorder: Secondary | ICD-10-CM

## 2023-03-10 DIAGNOSIS — I5022 Chronic systolic (congestive) heart failure: Secondary | ICD-10-CM | POA: Diagnosis not present

## 2023-03-10 DIAGNOSIS — R61 Generalized hyperhidrosis: Secondary | ICD-10-CM

## 2023-03-10 DIAGNOSIS — F320 Major depressive disorder, single episode, mild: Secondary | ICD-10-CM | POA: Diagnosis not present

## 2023-03-10 DIAGNOSIS — G2581 Restless legs syndrome: Secondary | ICD-10-CM

## 2023-03-10 MED ORDER — REXULTI 0.5 MG PO TABS
0.5000 mg | ORAL_TABLET | Freq: Every day | ORAL | 1 refills | Status: DC
Start: 2023-03-10 — End: 2023-03-16

## 2023-03-10 MED ORDER — BREXPIPRAZOLE 0.25 MG PO TABS
0.2500 mg | ORAL_TABLET | Freq: Every day | ORAL | Status: DC
Start: 2023-03-10 — End: 2023-03-10

## 2023-03-10 MED ORDER — SERTRALINE HCL 100 MG PO TABS
100.0000 mg | ORAL_TABLET | Freq: Every day | ORAL | Status: DC
Start: 2023-03-10 — End: 2023-04-28

## 2023-03-10 MED ORDER — ROPINIROLE HCL 0.5 MG PO TABS: 0.5000 mg | ORAL_TABLET | Freq: Every day | ORAL | 3 refills | Status: DC

## 2023-03-10 NOTE — Assessment & Plan Note (Addendum)
Chronic  Stable  Followed by cardiology  Follow up as scheduled  Continue lisinopril 5mg , metoprolol 50mg  and atorvastatin 40mg  daily

## 2023-03-10 NOTE — Assessment & Plan Note (Signed)
New onset of restless legs, causing sleep disturbance. Previous trial of magnesium and iron without improvement. chronic, progressively more frequent -Start Ropinirole 0.5mg  at bedtime.

## 2023-03-10 NOTE — Progress Notes (Signed)
Established patient visit   Patient: Lori [name]   DOB: [birth year]   47 y.o. Female  MRN: [id number] Visit Date: 03/10/2023  Today's healthcare provider: Ronnald Ramp, MD   Chief Complaint  Patient presents with   Medical Management of Chronic Issues    Follow up, would like to discuss menopause, has become very irritable, cries a lot more, agitated, has a sensation to move legs all the time, possible restless leg syndrome, has been a problem for 6-8 months, intermittently, sporadic    Establish Care   Subjective     HPI     Medical Management of Chronic Issues    Additional comments: Follow up, would like to discuss menopause, has become very irritable, cries a lot more, agitated, has a sensation to move legs all the time, possible restless leg syndrome, has been a problem for 6-8 months, intermittently, sporadic         Comments   Transition of care from prior PCP within the office      Last edited by Ronnald Ramp, MD on 03/10/2023 11:04 AM.       Discussed the use of AI scribe software for clinical note transcription with the patient, who gave verbal consent to proceed.  History of Present Illness   The patient, a 47 year old with a history of diabetes and cardiomyopathy, presents with complaints of restless legs, mood changes, and fatigue. The patient reports a recent onset of restless leg symptoms, characterized by an uncontrollable urge to move the legs, particularly at night. This has been disrupting her sleep and causing discomfort. Despite attempts to manage these symptoms with magnesium and iron supplements, there has been no noticeable improvement.  The patient also reports a significant change in mood over the past few weeks, suspecting the onset of premenopause. She describes experiencing hot flashes, night sweats, and mood swings, including increased irritability and frequent crying spells. The patient also reports a lack of  interest in daily activities, social events, and work, which is unusual for her. She has been taking Zoloft for depression, but the current dose does not seem to be managing these new symptoms effectively.  In addition to these symptoms, the patient reports a recent increase in fatigue. Despite taking daily supplements of vitamin D and B12, as recommended by her diabetes doctor due to low levels in recent blood work, the patient reports feeling exhausted by the end of the day. This fatigue is impacting her daily activities and overall quality of life.  The patient has a history of Hodgkin's disease, treated with radiation and chemotherapy at age 47. She is currently on a regimen of medications for diabetes, cardiomyopathy, and thyroid issues. The patient denies any thoughts of self-harm but reports increased anxiety and worry about various aspects of life. She has a prescription for Xanax, which is used as needed.          03/10/2023   11:01 AM 12/15/2022    2:35 PM 11/16/2022   10:31 AM  Depression screen PHQ 2/9  Decreased Interest 3 0 1  Down, Depressed, Hopeless 2 0 1  PHQ - 2 Score 5 0 2  Altered sleeping 3  1  Tired, decreased energy 3  1  Change in appetite 3  1  Feeling bad or failure about yourself  2  1  Trouble concentrating 2  1  Moving slowly or fidgety/restless 2  0  Suicidal thoughts 0  0  PHQ-9 Score 20  7  Difficult doing work/chores   Not difficult at all      03/10/2023   11:01 AM 10/12/2021    4:18 PM 07/06/2021    1:28 PM  GAD 7 : Generalized Anxiety Score  Nervous, Anxious, on Edge 2 1 2   Control/stop worrying 2 1 2   Worry too much - different things 2  3  Trouble relaxing 2 3 2   Restless 2  2  Easily annoyed or irritable 3 3 2   Afraid - awful might happen 2 1 2   Total GAD 7 Score 15  15  Anxiety Difficulty  Somewhat difficult Somewhat difficult      Past Medical History:  Diagnosis Date   Bacterial vaginitis    CHF (congestive heart failure) (HCC)     Colon polyps    Complication of anesthesia    nausea/vomiting   Depression    Diabetes mellitus without complication (HCC)    History of abnormal mammogram 2013   @[facility] - NEG MRI AND BX OCT 2014 NORMAL   History of mammogram 03/2015   BREAST MRI AT [facility] WNL   History of Papanicolaou smear of cervix 10/08/2013; 08/04/15   -/-; ASCUS/HPV NEG;   Hodgkin's disease (HCC) 1987   She had surgical resection of Lymph nodes and chemo + rad tx's.   Hypertension    Hypothyroidism    2ND TO CA TX   PONV (postoperative nausea and vomiting)    Restrictive lung disease    Vitamin D deficiency     Medications: Outpatient Medications Prior to Visit  Medication Sig   ALPRAZolam (XANAX) 0.5 MG tablet TAKE 1 TABLET BY MOUTH AT BEDTIME AS NEEDED FOR ANXIETY AND 1/2 TAB DURING THE DAY FOR PANIC ATTACKS. DO NOT TAKE WITH MUSCLE RELAXANTS   atorvastatin (LIPITOR) 40 MG tablet Take 1 tablet by mouth daily.   Continuous Blood Gluc Sensor (DEXCOM G6 SENSOR) MISC SMARTSIG:1 EACH TOPICAL EVERY 10 DAYS   Continuous Blood Gluc Transmit (DEXCOM G6 TRANSMITTER) MISC USE TO MONITOR BLOOD SUGARS. CHANGE EVERY 90 DAYS.   cyclobenzaprine (FLEXERIL) 5 MG tablet Take 1 tablet (5 mg total) by mouth 3 (three) times daily as needed for muscle spasms.   glimepiride (AMARYL) 4 MG tablet Take 2 mg by mouth.   levonorgestrel (MIRENA) 20 MCG/24HR IUD 1 each by Intrauterine route once.   levothyroxine (SYNTHROID) 75 MCG tablet Take 75 mcg by mouth daily.   lisinopril (ZESTRIL) 5 MG tablet Take by mouth.   metoprolol succinate (TOPROL-XL) 50 MG 24 hr tablet TAKE 1 TABLET BY MOUTH EVERY DAY (Patient taking differently: Take 50 mg by mouth as needed.)   pantoprazole (PROTONIX) 40 MG tablet TAKE 1 TABLET BY MOUTH EVERY DAY   SYNJARDY XR 12.10-998 MG TB24 Take 2 tablets by mouth every morning.   [DISCONTINUED] metoCLOPramide (REGLAN) 5 MG tablet Take 1 tablet (5 mg total) by mouth 3 (three) times daily as needed for nausea.    [DISCONTINUED] sertraline (ZOLOFT) 50 MG tablet TAKE 1 TABLET BY MOUTH EVERY DAY   [DISCONTINUED] nitrofurantoin (MACRODANTIN) 50 MG capsule Take 1 capsule (50 mg total) by mouth daily.   [DISCONTINUED] topiramate (TOPAMAX) 25 MG tablet Take 25 mg by mouth at bedtime. (Patient not taking: Reported on 12/15/2022)   No facility-administered medications prior to visit.    Review of Systems  Last CBC Lab Results  Component Value Date   WBC 10.4 11/16/2022   HGB 13.7 11/16/2022   HCT 41.1 11/16/2022   MCV 85 11/16/2022   MCH  28.2 11/16/2022   RDW 14.0 11/16/2022   PLT 491 (H) 11/16/2022   Last metabolic panel Lab Results  Component Value Date   GLUCOSE 147 (H) 11/16/2022   NA 139 11/16/2022   K 5.3 (H) 11/16/2022   CL 101 11/16/2022   CO2 17 (L) 11/16/2022   BUN 23 11/16/2022   CREATININE 0.83 11/16/2022   EGFR 87 11/16/2022   CALCIUM 10.5 (H) 11/16/2022   PHOS 2.9 04/11/2014   PROT 8.1 11/16/2022   ALBUMIN 5.0 (H) 11/16/2022   LABGLOB 3.1 11/16/2022   AGRATIO 1.6 11/16/2022   BILITOT 0.2 11/16/2022   ALKPHOS 79 11/16/2022   AST 18 11/16/2022   ALT 20 11/16/2022   ANIONGAP 8 03/02/2017   Last hemoglobin A1c Lab Results  Component Value Date   HGBA1C 7.9 02/18/2022   Last thyroid functions Lab Results  Component Value Date   TSH 0.215 (L) 10/14/2021        Objective    BP (!) 108/59 (BP Location: Left Arm, Patient Position: Sitting, Cuff Size: Large)   Pulse 83   Ht 5\' 6"  (1.676 m)   Wt 148 lb 9.6 oz (67.4 kg)   SpO2 100%   BMI 23.98 kg/m  BP Readings from Last 3 Encounters:  03/10/23 (!) 108/59  12/15/22 97/69  11/16/22 92/72   Wt Readings from Last 3 Encounters:  03/10/23 148 lb 9.6 oz (67.4 kg)  12/15/22 146 lb 6.4 oz (66.4 kg)  11/16/22 146 lb 1.6 oz (66.3 kg)       Physical Exam Vitals reviewed.  Constitutional:      General: She is not in acute distress.    Appearance: Normal appearance. She is not ill-appearing, toxic-appearing or  diaphoretic.  Eyes:     Conjunctiva/sclera: Conjunctivae normal.  Cardiovascular:     Rate and Rhythm: Normal rate and regular rhythm.     Pulses: Normal pulses.     Heart sounds: Normal heart sounds. No murmur heard.    No friction rub. No gallop.  Pulmonary:     Effort: Pulmonary effort is normal. No respiratory distress.     Breath sounds: Normal breath sounds. No stridor. No wheezing, rhonchi or rales.  Abdominal:     General: Bowel sounds are normal. There is no distension.     Palpations: Abdomen is soft.     Tenderness: There is no abdominal tenderness.  Musculoskeletal:     Right lower leg: No edema.     Left lower leg: No edema.  Skin:    Findings: No erythema or rash.  Neurological:     Mental Status: She is alert and oriented to person, place, and time.  Psychiatric:        Mood and Affect: Mood is anxious. Mood is not depressed. Affect is not flat or tearful.       No results found for any visits on 03/10/23.  Assessment & Plan     Problem List Items Addressed This Visit     Chronic systolic heart failure (HCC)    Chronic  Stable  Followed by cardiology  Follow up as scheduled  Continue lisinopril 5mg , metoprolol 50mg  and atorvastatin 40mg  daily      Depression, major, single episode, mild (HCC) - Primary    Reports of low mood, crying spells, lack of interest, and sleep disturbance. Current on Zoloft 50mg  daily. Chronic, worsened  Flowsheet Row Office Visit from 03/10/2023 in [facility]  PHQ-9 Total Score 20       -  Increase Zoloft to 100mg  daily. -Start Rexulti 0.5mg  daily after 3 days of Ropinirole in case of SE, no samples available for correct dose so pt given RX savings card       Relevant Medications   sertraline (ZOLOFT) 100 MG tablet   Brexpiprazole (REXULTI) 0.5 MG TABS   Diabetes mellitus, type 2 (HCC)    Stable on current regimen. followed by endocrinology -Continue current medications and monitor blood  glucose levels.      GAD (generalized anxiety disorder)    Chronic  Stable  Continue Zoloft at increased dose of 100mg  daily and PRN Xanax 0.5mg        Relevant Medications   sertraline (ZOLOFT) 100 MG tablet   Night sweats   Restless legs    New onset of restless legs, causing sleep disturbance. Previous trial of magnesium and iron without improvement. chronic, progressively more frequent -Start Ropinirole 0.5mg  at bedtime.      Other Visit Diagnoses     Restless leg       Relevant Medications   rOPINIRole (REQUIP) 0.5 MG tablet           Perimenopause Reports of hot flashes, night sweats, mood changes, and agitation. -Continue current management and monitor symptoms, continue zoloft at 100mg  dose    Vitamin D and B12 Deficiency Low levels identified on recent blood work. -Continue daily Vitamin D and B12 supplements.        Return in about 1 month (around 04/10/2023) for Depression.         Ronnald Ramp, MD  [facility] [phone] (phone) [phone] (fax)  [facility]

## 2023-03-10 NOTE — Assessment & Plan Note (Signed)
Chronic  Stable  Continue Zoloft at increased dose of 100mg  daily and PRN Xanax 0.5mg 

## 2023-03-10 NOTE — Assessment & Plan Note (Signed)
Reports of low mood, crying spells, lack of interest, and sleep disturbance. Current on Zoloft 50mg  daily. Chronic, worsened  Flowsheet Row Office Visit from 03/10/2023 in [facility]  PHQ-9 Total Score 20       -Increase Zoloft to 100mg  daily. -Start Rexulti 0.5mg  daily after 3 days of Ropinirole in case of SE, no samples available for correct dose so pt given RX savings card

## 2023-03-10 NOTE — Assessment & Plan Note (Signed)
Stable on current regimen. followed by endocrinology -Continue current medications and monitor blood glucose levels.

## 2023-03-16 ENCOUNTER — Other Ambulatory Visit: Payer: Self-pay | Admitting: Family Medicine

## 2023-03-16 DIAGNOSIS — F320 Major depressive disorder, single episode, mild: Secondary | ICD-10-CM

## 2023-03-16 MED ORDER — REXULTI 0.5 MG PO TABS: ORAL_TABLET | ORAL | 0 refills | Status: DC

## 2023-03-22 ENCOUNTER — Telehealth: Payer: Self-pay

## 2023-03-22 DIAGNOSIS — F320 Major depressive disorder, single episode, mild: Secondary | ICD-10-CM

## 2023-03-22 MED ORDER — REXULTI 0.5 MG PO TABS: 0.5000 mg | ORAL_TABLET | Freq: Every day | ORAL | 0 refills | Status: AC

## 2023-03-22 MED ORDER — REXULTI 0.5 MG PO TABS
1.0000 mg | ORAL_TABLET | Freq: Every day | ORAL | 2 refills | Status: DC
Start: 2023-03-22 — End: 2023-04-11

## 2023-03-22 NOTE — Addendum Note (Signed)
Addended by: Bing Neighbors on: 03/22/2023 04:57 PM   Modules accepted: Orders

## 2023-03-22 NOTE — Telephone Encounter (Signed)
1mg  Rexulti sent to pharmacy as requested.   Loading dose for 0.5mg  sent to [facility] in [place] as requested    Please notify patient.   Ronnald Ramp, MD  [facility]

## 2023-03-22 NOTE — Telephone Encounter (Signed)
Megan, customer care with [facility] advocate works with [employer] and pt cost [phone]) calling to notify Dr. Roxan Hockey they do not fill loading dose, only maintenance doses. They are requiring new rx for Rexulti 1mg  90 DS to fill for pt and the Resxulti 0.5mg  x 7 days would need to be sent to local pharmacy. Advised I would send to provider for review.

## 2023-03-25 ENCOUNTER — Ambulatory Visit: Payer: Self-pay

## 2023-03-25 NOTE — Telephone Encounter (Signed)
Please ask to fill the 1mg  daily prescription   Ronnald Ramp, MD  [facility]

## 2023-03-25 NOTE — Telephone Encounter (Signed)
Lori [name] is calling in because they need clarification on the Brexpiprazole (REXULTI) 0.5 MG TABS [[id number]]. They received two scripts and want to know if they can just fill the 1 mg tablet instead of the 0.5 mg tablet   Chief Complaint: Pharmacy asking to dispense Rexulti 1 mg instead of 0.5 mg 2 tablets. Symptoms: n/a Frequency: n/a Pertinent Negatives: Patient denies  Disposition: [] ED /[] Urgent Care (no appt availability in office) / [] Appointment(In office/virtual)/ []  [facility]/ [] Home Care/ [] Refused Recommended Disposition /[] [place] [facility]/ [x]  Follow-up with PCP Additional Notes: Please advise pharmacy.  Reason for Disposition  [1] Caller has URGENT medicine question about med that PCP or specialist prescribed AND [2] triager unable to answer question  Answer Assessment - Initial Assessment Questions 1. NAME of MEDICINE: "What medicine(s) are you calling about?"     Rexulti 0.5 mg 2. QUESTION: "What is your question?" (e.g., double dose of medicine, side effect)     Pharmacy asking if they can dispense 1 mg tablet instead. 3. PRESCRIBER: "Who prescribed the medicine?" Reason: if prescribed by specialist, call should be referred to that group.     Dr. Roxan Hockey 4. SYMPTOMS: "Do you have any symptoms?" If Yes, ask: "What symptoms are you having?"  "How bad are the symptoms (e.g., mild, moderate, severe)     N/a 5. PREGNANCY:  "Is there any chance that you are pregnant?" "When was your last menstrual period?"     N/a  Protocols used: Medication Question Call-A-AH

## 2023-03-25 NOTE — Telephone Encounter (Signed)
Rachel advised.

## 2023-03-30 ENCOUNTER — Ambulatory Visit (HOSPITAL_BASED_OUTPATIENT_CLINIC_OR_DEPARTMENT_OTHER): Payer: Self-pay | Admitting: Orthopaedic Surgery

## 2023-04-01 ENCOUNTER — Ambulatory Visit (HOSPITAL_BASED_OUTPATIENT_CLINIC_OR_DEPARTMENT_OTHER): Payer: Self-pay | Admitting: Orthopaedic Surgery

## 2023-04-05 ENCOUNTER — Encounter: Payer: Self-pay | Admitting: Family Medicine

## 2023-04-08 ENCOUNTER — Other Ambulatory Visit: Payer: Self-pay | Admitting: Physician Assistant

## 2023-04-08 DIAGNOSIS — E119 Type 2 diabetes mellitus without complications: Secondary | ICD-10-CM

## 2023-04-08 NOTE — Telephone Encounter (Signed)
Requested Prescriptions  Pending Prescriptions Disp Refills   Continuous Glucose Sensor (DEXCOM G6 SENSOR) MISC [Pharmacy Med Name: DEXCOM G6 SENSOR] 9 each 0    Sig: SMARTSIG:1 EACH TOPICAL EVERY 10 DAYS     Endocrinology: Diabetes - Testing Supplies Passed - 04/08/2023  3:23 PM      Passed - Valid encounter within last 12 months    Recent Outpatient Visits           4 weeks ago Depression, major, single episode, mild (HCC)   [place] [facility] Simmons-Robinson, [place], MD   3 months ago Neck pain   [place] [facility] [name], PA-C   4 months ago Nausea and vomiting, unspecified vomiting type   [facility] [name], PA-C   8 months ago Acute cystitis with hematuria   [facility] [name], PA-C   11 months ago Depression, major, single episode, mild [facility])   [place] [facility] [name], PA-C       Future Appointments             In 3 days Simmons-Robinson, Tawanna Cooler, MD [facility]   In 1 week Huel Cote, MD [facility], [place]

## 2023-04-11 ENCOUNTER — Ambulatory Visit (HOSPITAL_BASED_OUTPATIENT_CLINIC_OR_DEPARTMENT_OTHER): Payer: Self-pay | Admitting: Orthopaedic Surgery

## 2023-04-11 ENCOUNTER — Encounter: Payer: Self-pay | Admitting: Family Medicine

## 2023-04-11 ENCOUNTER — Ambulatory Visit: Payer: PRIVATE HEALTH INSURANCE | Admitting: Family Medicine

## 2023-04-11 VITALS — BP 92/66 | HR 79 | Ht 66.0 in | Wt 152.0 lb

## 2023-04-11 DIAGNOSIS — F320 Major depressive disorder, single episode, mild: Secondary | ICD-10-CM | POA: Diagnosis not present

## 2023-04-11 MED ORDER — BUSPIRONE HCL 10 MG PO TABS
10.0000 mg | ORAL_TABLET | Freq: Two times a day (BID) | ORAL | 2 refills | Status: DC
Start: 2023-04-11 — End: 2023-06-10

## 2023-04-11 NOTE — Assessment & Plan Note (Signed)
Depression and Anxiety,chronic Persistent symptoms despite Zoloft 100mg  daily. No improvement noted with Rexulti 0.5mg  daily, which has been discontinued. Patient reports some improvement in mood and fatigue but still lacks motivation and energy. Also reports irritability and occasional difficulty sleeping. -Start Buspirone 10mg  twice daily. -Continue Zoloft 100mg  daily. -Consider counseling or therapy, provided resources for finding a therapist.

## 2023-04-11 NOTE — Patient Instructions (Signed)
[email or web address]

## 2023-04-11 NOTE — Progress Notes (Signed)
Established patient visit   Patient: Lori [name]   DOB: 1976/04/16   47 y.o. Female  MRN: [id number] Visit Date: 04/11/2023  Today's healthcare provider: Ronnald Ramp, MD   Chief Complaint  Patient presents with   Depression   Subjective       Discussed the use of AI scribe software for clinical note transcription with the patient, who gave verbal consent to proceed.  History of Present Illness   Lori [name], a 47 year old female with a history of depression and anxiety, presents for a follow-up visit. She reports no improvement in her depressive symptoms despite an increase in her Zoloft dosage to 100mg  daily and the addition of Rexulti 0.5mg  daily. The patient decided to discontinue Rexulti due to lack of perceived benefit.  She describes her mood as "blah" and reports a lack of motivation or "drive." While she notes a slight improvement in her mood and fatigue levels, she still experiences significant tiredness and occasional difficulty sleeping. She often feels like she doesn't want to get up in the morning and has considered calling out of work due to these feelings.  The patient also reports frequent feelings of irritation and aggravation, although she notes a slight improvement in these symptoms as well. She denies any new medications and has been compliant with her current regimen. She has previously tried Wellbutrin for her depression without significant benefit.         Past Medical History:  Diagnosis Date   Bacterial vaginitis    CHF (congestive heart failure) (HCC)    Colon polyps    Complication of anesthesia    nausea/vomiting   Depression    Diabetes mellitus without complication (HCC)    History of abnormal mammogram 2013   @[facility] - NEG MRI AND BX OCT 2014 NORMAL   History of mammogram 03/2015   BREAST MRI AT [facility] WNL   History of Papanicolaou smear of cervix 10/08/2013; 08/04/15   -/-; ASCUS/HPV NEG;   Hodgkin's disease (HCC) 1987   She  had surgical resection of Lymph nodes and chemo + rad tx's.   Hypertension    Hypothyroidism    2ND TO [place] TX   PONV (postoperative nausea and vomiting)    Restrictive lung disease    Vitamin D deficiency     Medications: Outpatient Medications Prior to Visit  Medication Sig   ALPRAZolam (XANAX) 0.5 MG tablet TAKE 1 TABLET BY MOUTH AT BEDTIME AS NEEDED FOR ANXIETY AND 1/2 TAB DURING THE DAY FOR PANIC ATTACKS. DO NOT TAKE WITH MUSCLE RELAXANTS   atorvastatin (LIPITOR) 40 MG tablet Take 1 tablet by mouth daily.   Continuous Blood Gluc Transmit (DEXCOM G6 TRANSMITTER) MISC USE TO MONITOR BLOOD SUGARS. CHANGE EVERY 90 DAYS.   Continuous Glucose Sensor (DEXCOM G6 SENSOR) MISC SMARTSIG:1 EACH TOPICAL EVERY 10 DAYS   glimepiride (AMARYL) 4 MG tablet Take 2 mg by mouth.   levonorgestrel (MIRENA) 20 MCG/24HR IUD 1 each by Intrauterine route once.   levothyroxine (SYNTHROID) 75 MCG tablet Take 75 mcg by mouth daily.   lisinopril (ZESTRIL) 5 MG tablet Take by mouth.   metoprolol succinate (TOPROL-XL) 50 MG 24 hr tablet TAKE 1 TABLET BY MOUTH EVERY DAY (Patient taking differently: Take 50 mg by mouth as needed.)   pantoprazole (PROTONIX) 40 MG tablet TAKE 1 TABLET BY MOUTH EVERY DAY   rOPINIRole (REQUIP) 0.5 MG tablet Take 1 tablet (0.5 mg total) by mouth at bedtime.   sertraline (ZOLOFT) 100 MG tablet  Take 1 tablet (100 mg total) by mouth daily.   SYNJARDY XR 12.10-998 MG TB24 Take 2 tablets by mouth every morning.   [DISCONTINUED] Brexpiprazole (REXULTI) 0.5 MG TABS Take 2 tablets (1 mg total) by mouth daily.   [DISCONTINUED] cyclobenzaprine (FLEXERIL) 5 MG tablet Take 1 tablet (5 mg total) by mouth 3 (three) times daily as needed for muscle spasms.   No facility-administered medications prior to visit.    Review of Systems      Objective    BP 92/66   Pulse 79   Ht 5\' 6"  (1.676 m)   Wt 152 lb (68.9 kg)   SpO2 98%   BMI 24.53 kg/m   BP Readings from Last 3 Encounters:  04/11/23  92/66  03/10/23 (!) 108/59  12/15/22 97/69   Wt Readings from Last 3 Encounters:  04/11/23 152 lb (68.9 kg)  03/10/23 148 lb 9.6 oz (67.4 kg)  12/15/22 146 lb 6.4 oz (66.4 kg)          Physical Exam Vitals reviewed.  Constitutional:      General: She is not in acute distress.    Appearance: Normal appearance. She is not ill-appearing, toxic-appearing or diaphoretic.  Eyes:     Conjunctiva/sclera: Conjunctivae normal.  Cardiovascular:     Rate and Rhythm: Normal rate and regular rhythm.     Pulses: Normal pulses.     Heart sounds: Normal heart sounds. No murmur heard.    No friction rub. No gallop.  Pulmonary:     Effort: Pulmonary effort is normal. No respiratory distress.     Breath sounds: Normal breath sounds. No stridor. No wheezing, rhonchi or rales.  Abdominal:     General: Bowel sounds are normal. There is no distension.     Palpations: Abdomen is soft.     Tenderness: There is no abdominal tenderness.  Musculoskeletal:     Right lower leg: No edema.     Left lower leg: No edema.  Skin:    Findings: No erythema or rash.  Neurological:     Mental Status: She is alert and oriented to person, place, and time.  Psychiatric:        Mood and Affect: Mood is depressed. Affect is not tearful.     Comments: Describes mood as "blah"        No results found for any visits on 04/11/23.  Assessment & Plan     Problem List Items Addressed This Visit       Other   Depression, major, single episode, mild (HCC) - Primary    Depression and Anxiety,chronic Persistent symptoms despite Zoloft 100mg  daily. No improvement noted with Rexulti 0.5mg  daily, which has been discontinued. Patient reports some improvement in mood and fatigue but still lacks motivation and energy. Also reports irritability and occasional difficulty sleeping. -Start Buspirone 10mg  twice daily. -Continue Zoloft 100mg  daily. -Consider counseling or therapy, provided resources for finding a  therapist.      Relevant Medications   busPIRone (BUSPAR) 10 MG tablet        Follow-up in 3 weeks via virtual visit to assess response to medication changes.       Return in about 3 weeks (around 05/02/2023) for Depression.      Ronnald Ramp, MD  [facility] [phone] (phone) [phone] (fax)  [facility]

## 2023-04-15 ENCOUNTER — Other Ambulatory Visit: Payer: Self-pay | Admitting: Physician Assistant

## 2023-04-15 DIAGNOSIS — F411 Generalized anxiety disorder: Secondary | ICD-10-CM

## 2023-04-20 ENCOUNTER — Ambulatory Visit (HOSPITAL_BASED_OUTPATIENT_CLINIC_OR_DEPARTMENT_OTHER): Payer: Self-pay | Admitting: Orthopaedic Surgery

## 2023-04-28 MED ORDER — SERTRALINE HCL 100 MG PO TABS: 100.0000 mg | ORAL_TABLET | Freq: Every day | ORAL | 3 refills | Status: DC

## 2023-04-28 NOTE — Telephone Encounter (Signed)
What is the qty and number of refills?

## 2023-04-29 ENCOUNTER — Other Ambulatory Visit: Payer: Self-pay

## 2023-04-29 DIAGNOSIS — F320 Major depressive disorder, single episode, mild: Secondary | ICD-10-CM

## 2023-04-29 MED ORDER — SERTRALINE HCL 100 MG PO TABS
100.0000 mg | ORAL_TABLET | Freq: Every day | ORAL | 3 refills | Status: DC
Start: 2023-04-29 — End: 2024-02-27

## 2023-04-30 ENCOUNTER — Other Ambulatory Visit: Payer: Self-pay | Admitting: Urology

## 2023-05-04 ENCOUNTER — Ambulatory Visit (HOSPITAL_BASED_OUTPATIENT_CLINIC_OR_DEPARTMENT_OTHER): Payer: PRIVATE HEALTH INSURANCE | Admitting: Orthopaedic Surgery

## 2023-05-04 ENCOUNTER — Other Ambulatory Visit: Payer: Self-pay | Admitting: Family Medicine

## 2023-05-04 ENCOUNTER — Ambulatory Visit (HOSPITAL_BASED_OUTPATIENT_CLINIC_OR_DEPARTMENT_OTHER): Payer: Self-pay | Admitting: Orthopaedic Surgery

## 2023-05-04 DIAGNOSIS — S76011A Strain of muscle, fascia and tendon of right hip, initial encounter: Secondary | ICD-10-CM

## 2023-05-04 DIAGNOSIS — M25551 Pain in right hip: Secondary | ICD-10-CM | POA: Diagnosis not present

## 2023-05-04 DIAGNOSIS — F320 Major depressive disorder, single episode, mild: Secondary | ICD-10-CM

## 2023-05-04 MED ORDER — TRIAMCINOLONE ACETONIDE 40 MG/ML IJ SUSP
80.0000 mg | INTRAMUSCULAR | Status: AC | PRN
  Administered 2023-05-04: 80 mg via INTRA_ARTICULAR

## 2023-05-04 MED ORDER — LIDOCAINE HCL 1 % IJ SOLN
4.0000 mL | INTRAMUSCULAR | Status: AC | PRN
  Administered 2023-05-04: 4 mL

## 2023-05-04 NOTE — Progress Notes (Signed)
Chief Complaint: Right hip pain     History of Present Illness:   05/04/2023: Today today for follow-up of her right hip.  She did get extremely good relief from her injection which lasted approximately 6 months.  She has been working in physical therapy and doing a home exercise program which does help  Lori [name] is a 47 y.o. female presents today with ongoing right hip pain which has been bothersome to her since 2019.  At this point she states that the hip pain has been progressing and she is experiencing this predominantly about the lateral aspect of the hip.  This is now much more bothersome and is bothering her even while she is sitting down.  She does have small grandchildren which she enjoys being active with although she feels like her hip is limiting her quite significantly.  She has previously had a femoral acetabular injection but got no relief from this.  She did trial physical therapy as well which she worked on range of motion but this did not improve her hip.  She is here today after seeing my partner Dr. Roda Shutters for further discussion    Surgical History:   None  PMH/PSH/Family History/Social History/Meds/Allergies:    Past Medical History:  Diagnosis Date  . Bacterial vaginitis   . CHF (congestive heart failure) (HCC)   . Colon polyps   . Complication of anesthesia    nausea/vomiting  . Depression   . Diabetes mellitus without complication (HCC)   . History of abnormal mammogram 2013   @[facility] - NEG MRI AND BX OCT 2014 NORMAL  . History of mammogram 03/2015   BREAST MRI AT [facility] WNL  . History of Papanicolaou smear of cervix 10/08/2013; 08/04/15   -/-; ASCUS/HPV NEG;  . Hodgkin's disease (HCC) 1987   She had surgical resection of Lymph nodes and chemo + rad tx's.  Marland Kitchen Hypertension   . Hypothyroidism    2ND TO [place] TX  . PONV (postoperative nausea and vomiting)   . Restrictive lung disease   . Vitamin D deficiency    Past Surgical  History:  Procedure Laterality Date  . CHOLECYSTECTOMY    . DEEP NECK LYMPH NODE BIOPSY / EXCISION  1987   hodgkin lymphoma  . DILATATION & CURETTAGE/HYSTEROSCOPY WITH MYOSURE N/A 03/10/2017   Procedure: DILATATION & CURETTAGE/HYSTEROSCOPY WITH MYOSURE;  Surgeon: Conard Novak, MD;  Location: [facility] ORS;  Service: Gynecology;  Laterality: N/A;  . INTRAUTERINE DEVICE (IUD) INSERTION  [id number]; 02/06/09; 12/12/13  . INTRAUTERINE DEVICE (IUD) INSERTION N/A 03/10/2017   Procedure: INTRAUTERINE DEVICE (IUD) INSERTION;  Surgeon: Conard Novak, MD;  Location: [facility] ORS;  Service: Gynecology;  Laterality: N/A;  . SPLENECTOMY    . THYROIDECTOMY     Social History   Socioeconomic History  . Marital status: Married    Spouse name: Not on file  . Number of children: Not on file  . Years of education: 49  . Highest education level: GED or equivalent  Occupational History  . Occupation: QUOTATION SPEC    Employer: [employer]  Tobacco Use  . Smoking status: Former    Current packs/day: 0.00    Average packs/day: 0.3 packs/day for 10.0 years (2.5 ttl pk-yrs)    Types: Cigarettes    Start date: 03/10/2006    Quit  date: 03/10/2016    Years since quitting: 7.1  . Smokeless tobacco: Never  Vaping Use  . Vaping status: Never Used  Substance and Sexual Activity  . Alcohol use: Yes    Alcohol/week: 2.0 standard drinks of alcohol    Types: 2 Glasses of wine per week    Comment: occasional  . Drug use: No  . Sexual activity: Yes    Birth control/protection: I.U.D.    Comment: Mirena  Other Topics Concern  . Not on file  Social History Narrative  . Not on file   Social Determinants of Health   Financial Resource Strain: Low Risk  (03/08/2023)   Overall Financial Resource Strain (CARDIA)   . Difficulty of Paying Living Expenses: Not very hard  Food Insecurity: No Food Insecurity (03/08/2023)   Hunger Vital Sign   . Worried About Programme researcher, broadcasting/film/video in the Last Year: Never true   .  Ran Out of Food in the Last Year: Never true  Transportation Needs: No Transportation Needs (03/08/2023)   PRAPARE - Transportation   . Lack of Transportation (Medical): No   . Lack of Transportation (Non-Medical): No  Physical Activity: Insufficiently Active (03/08/2023)   Exercise Vital Sign   . Days of Exercise per Week: 1 day   . Minutes of Exercise per Session: 10 min  Stress: Stress Concern Present (03/08/2023)   [employer] of Occupational Health - Occupational Stress Questionnaire   . Feeling of Stress : To some extent  Social Connections: Socially Integrated (03/08/2023)   Social Connection and Isolation Panel [NHANES]   . Frequency of Communication with Friends and Family: More than three times a week   . Frequency of Social Gatherings with Friends and Family: Three times a week   . Attends Religious Services: More than 4 times per year   . Active Member of Clubs or Organizations: Yes   . Attends Banker Meetings: More than 4 times per year   . Marital Status: Married   Family History  Problem Relation Age of Onset  . Hypertension Mother   . Other Maternal Grandfather        bile duct cancer  . Colon cancer Paternal Grandmother        72-70   Allergies  Allergen Reactions  . Dapagliflozin Other (See Comments)    Marcelline Deist) UTI  . Liraglutide Nausea Only and Nausea And Vomiting    Victoza Victoza   Current Outpatient Medications  Medication Sig Dispense Refill  . ALPRAZolam (XANAX) 0.5 MG tablet TAKE 1 TABLET BY MOUTH AT BEDTIME AS NEEDED FOR ANXIETY AND 1/2 TAB DURING THE DAY FOR PANIC ATTACKS. DO NOT TAKE WITH MUSCLE RELAXANTS 30 tablet 0  . atorvastatin (LIPITOR) 40 MG tablet Take 1 tablet by mouth daily.    . busPIRone (BUSPAR) 10 MG tablet Take 1 tablet (10 mg total) by mouth 2 (two) times daily. 60 tablet 2  . Continuous Blood Gluc Transmit (DEXCOM G6 TRANSMITTER) MISC USE TO MONITOR BLOOD SUGARS. CHANGE EVERY 90 DAYS.    . Continuous Glucose  Sensor (DEXCOM G6 SENSOR) MISC SMARTSIG:1 EACH TOPICAL EVERY 10 DAYS 9 each 0  . glimepiride (AMARYL) 4 MG tablet Take 2 mg by mouth.    . levonorgestrel (MIRENA) 20 MCG/24HR IUD 1 each by Intrauterine route once.    Marland Kitchen levothyroxine (SYNTHROID) 75 MCG tablet Take 75 mcg by mouth daily.    Marland Kitchen lisinopril (ZESTRIL) 5 MG tablet Take by mouth.    . metoprolol  succinate (TOPROL-XL) 50 MG 24 hr tablet TAKE 1 TABLET BY MOUTH EVERY DAY (Patient taking differently: Take 50 mg by mouth as needed.) 90 tablet 1  . pantoprazole (PROTONIX) 40 MG tablet TAKE 1 TABLET BY MOUTH EVERY DAY 90 tablet 0  . rOPINIRole (REQUIP) 0.5 MG tablet Take 1 tablet (0.5 mg total) by mouth at bedtime. 30 tablet 3  . sertraline (ZOLOFT) 100 MG tablet Take 1 tablet (100 mg total) by mouth daily. 90 tablet 3  . SYNJARDY XR 12.10-998 MG TB24 Take 2 tablets by mouth every morning.     No current facility-administered medications for this visit.   No results found.  Review of Systems:   A ROS was performed including pertinent positives and negatives as documented in the HPI.  Physical Exam :   Constitutional: NAD and appears stated age Neurological: Alert and oriented Psych: Appropriate affect and cooperative There were no vitals taken for this visit.   Comprehensive Musculoskeletal Exam:    Inspection Right Left  Skin No atrophy or gross abnormalities appreciated No atrophy or gross abnormalities appreciated  Palpation    Tenderness Greater trochanter, gluteus medius None  Crepitus None None  Range of Motion    Flexion (passive) 120 120  Extension 30 30  IR 40, mild pain 45  ER 50 50  Strength    Flexion  5/5 5/5  Extension 5/5 5/5  Special Tests    FABER Negative Negative  FADIR Equivocally positive Negative  ER Lag/Capsular Insufficiency Negative Negative  Instability Negative Negative  Sacroiliac pain Negative  Negative   Instability    Generalized Laxity No No  Neurologic    sciatic, femoral, obturator  nerves intact to light sensation  Vascular/Lymphatic    DP pulse 2+ 2+  Lumbar Exam    Patient has symmetric lumbar range of motion with negative pain referral to hip   Weakness with resisted abduction of the right hip  Imaging:   Xray (3 views right hip): Normal  MRI (right hip): There is a hyportrophic and partially torn labrum as well as insertional undersurface tearing of the gluteus medius  I personally reviewed and interpreted the radiographs.   Assessment:   47 y.o. female with right lateral based hip pain.  I did describe that I do believe her hip pain is somewhat complex.  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  She did get extremely good relief from 6 months from the lateral based trochanteric injection.  She is improving with physical therapy although this is only been limited and she is still not persistently happy with the quality of her hip.  She is having a hard time laying on the side.  Given this I did discuss the role for possible gluteus medius minimus repair with trochanteric bursectomy.  She would like an additional injection as she will not likely be able to have this done for several months.  Plan :    -Plan for right hip gluteus medius repair and trochanteric bursectomy   After a lengthy discussion of treatment options, including risks, benefits, alternatives, complications of surgical and nonsurgical conservative options, the patient elected surgical repair.   The patient  is aware of the material risks  and complications including, but not limited to injury to adjacent structures, neurovascular injury, infection, numbness, bleeding, implant failure, thermal burns, stiffness, persistent pain, failure to heal, disease transmission from allograft, need for further  surgery, dislocation, anesthetic risks, blood clots, risks of death,and others. The  probabilities of surgical success and failure discussed with patient given their particular co-morbidities.The time and nature of expected rehabilitation and recovery was discussed.The patient's questions were all answered preoperatively.  No barriers to understanding were noted. I explained the natural history of the disease process and Rx rationale.  I explained to the patient what I considered to be reasonable expectations given their personal situation.  The final treatment plan was arrived at through a shared patient decision making process model.     Procedure Note  Patient: Lori [name]             Date of Birth: 04-Jan-1976           MRN: [id number]             Visit Date: 05/04/2023  Procedures: Visit Diagnoses: No diagnosis found.  Large Joint Inj: R greater trochanter on 05/04/2023 12:34 PM Indications: pain Details: 22 G 3.5 in needle, ultrasound-guided anterolateral approach  Arthrogram: No  Medications: 4 mL lidocaine 1 %; 80 mg triamcinolone acetonide 40 MG/ML Outcome: tolerated well, no immediate complications Procedure, treatment alternatives, risks and benefits explained, specific risks discussed. Consent was given by the patient. Immediately prior to procedure a time out was called to verify the correct patient, procedure, equipment, support staff and site/side marked as required. Patient was prepped and draped in the usual sterile fashion.         I personally saw and evaluated the patient, and participated in the management and treatment plan.  Huel Cote, MD Attending Physician, Orthopedic Surgery  This document was dictated using Dragon voice recognition software. A reasonable attempt at proof reading has been made to minimize errors.

## 2023-05-04 NOTE — Telephone Encounter (Signed)
Requested medication (s) are due for refill today: yes  Requested medication (s) are on the active medication list: yes  Last refill:  04/11/23 #60  Future visit scheduled: yes  Notes to clinic:  pharmacy requesting 90 day refill- pt was just started on med and was given total 90 day refill - can pt go ahead and have a 90 day refill?   Requested Prescriptions  Pending Prescriptions Disp Refills   busPIRone (BUSPAR) 10 MG tablet [Pharmacy Med Name: BUSPIRONE HCL 10 MG TABLET] 180 tablet 1    Sig: TAKE 1 TABLET BY MOUTH TWICE A DAY     Psychiatry: Anxiolytics/Hypnotics - Non-controlled Passed - 05/04/2023  8:32 AM      Passed - Valid encounter within last 12 months    Recent Outpatient Visits           3 weeks ago Depression, major, single episode, mild (HCC)   [name] [facility] Simmons-Robinson, [place], MD   1 month ago Depression, major, single episode, mild (HCC)   [place] [facility] Simmons-Robinson, [place], MD   4 months ago Neck pain   [facility] [place], PA-C   5 months ago Nausea and vomiting, unspecified vomiting type   [place] [name], PA-C   9 months ago Acute cystitis with hematuria   [facility] [name], PA-C       Future Appointments             In 1 week Simmons-Robinson, Tawanna Cooler, MD [facility]

## 2023-05-12 ENCOUNTER — Ambulatory Visit: Payer: PRIVATE HEALTH INSURANCE | Admitting: Family Medicine

## 2023-05-17 ENCOUNTER — Other Ambulatory Visit: Payer: Self-pay | Admitting: Family Medicine

## 2023-05-17 DIAGNOSIS — F411 Generalized anxiety disorder: Secondary | ICD-10-CM

## 2023-05-24 ENCOUNTER — Ambulatory Visit: Payer: PRIVATE HEALTH INSURANCE | Admitting: Family Medicine

## 2023-05-25 ENCOUNTER — Telehealth: Payer: PRIVATE HEALTH INSURANCE | Admitting: Physician Assistant

## 2023-05-25 ENCOUNTER — Ambulatory Visit: Payer: Self-pay | Admitting: *Deleted

## 2023-05-25 ENCOUNTER — Encounter: Payer: Self-pay | Admitting: Physician Assistant

## 2023-05-25 DIAGNOSIS — U071 COVID-19: Secondary | ICD-10-CM | POA: Diagnosis not present

## 2023-05-25 MED ORDER — BENZONATATE 100 MG PO CAPS: 100.0000 mg | ORAL_CAPSULE | Freq: Two times a day (BID) | ORAL | 0 refills | Status: DC | PRN

## 2023-05-25 MED ORDER — GUAIFENESIN-DM 100-10 MG/5ML PO SYRP: 5.0000 mL | ORAL_SOLUTION | ORAL | 0 refills | Status: DC | PRN

## 2023-05-25 NOTE — Progress Notes (Addendum)
MyChart Video Visit  Virtual Visit via Video Note   This format is felt to be most appropriate for this patient at this time. Physical exam was limited by quality of the video and audio technology used for the visit.   Provider location: Virtual Visit Location Provider: Home Office Patient Location: Home  I discussed the limitations of evaluation and management by telemedicine and the availability of in person appointments. The patient expressed understanding and agreed to proceed.  Patient: Lori [name]   DOB: Apr 29, 1976   47 y.o. Female  MRN: [id number] Visit Date: 05/25/2023  Today's healthcare provider: Debera Lat, PA-C   Chief Complaint  Patient presents with   Acute Visit   Subjective     Discussed the use of AI scribe software for clinical note transcription with the patient, who gave verbal consent to proceed.  History of Present Illness   The patient, with a history of diabetes, reactive airway disease, seasonal allergies, and acid reflux, tested positive for COVID-19. She reports a headache, stuffy nose, and a persistent cough that has been disrupting her sleep. She also describes body aches and fatigue. These symptoms began on Monday night and she tested positive for COVID-19 today. She denies fever and chest discomfort. She has been managing her symptoms with DayQuil. She lives with her husband who is currently asymptomatic. She has a history of reactive airway disease from 2016 and seasonal allergies. She is on multiple medications for her diabetes, which she reports is controlled with a recent A1c of 7.8.         Medications: Outpatient Medications Prior to Visit  Medication Sig   ALPRAZolam (XANAX) 0.5 MG tablet TAKE 1 TAB AT BEDTIME AS NEEDED FOR ANXIETY AND 1/2 TAB DURING THE DAY FOR PANIC ATTACKS. DO NOT TAKE WITH MUSCLE RELAXANTS   atorvastatin (LIPITOR) 40 MG tablet Take 1 tablet by mouth daily.   busPIRone (BUSPAR) 10 MG tablet Take 1 tablet (10 mg total)  by mouth 2 (two) times daily.   Continuous Blood Gluc Transmit (DEXCOM G6 TRANSMITTER) MISC USE TO MONITOR BLOOD SUGARS. CHANGE EVERY 90 DAYS.   Continuous Glucose Sensor (DEXCOM G6 SENSOR) MISC SMARTSIG:1 EACH TOPICAL EVERY 10 DAYS   glimepiride (AMARYL) 4 MG tablet Take 2 mg by mouth.   levonorgestrel (MIRENA) 20 MCG/24HR IUD 1 each by Intrauterine route once.   levothyroxine (SYNTHROID) 75 MCG tablet Take 75 mcg by mouth daily.   lisinopril (ZESTRIL) 5 MG tablet Take by mouth.   metoprolol succinate (TOPROL-XL) 50 MG 24 hr tablet TAKE 1 TABLET BY MOUTH EVERY DAY (Patient taking differently: Take 50 mg by mouth as needed.)   pantoprazole (PROTONIX) 40 MG tablet TAKE 1 TABLET BY MOUTH EVERY DAY   rOPINIRole (REQUIP) 0.5 MG tablet Take 1 tablet (0.5 mg total) by mouth at bedtime.   sertraline (ZOLOFT) 100 MG tablet Take 1 tablet (100 mg total) by mouth daily.   SYNJARDY XR 12.10-998 MG TB24 Take 2 tablets by mouth every morning.   No facility-administered medications prior to visit.    Review of Systems All negative  Except see HPI       Objective    There were no vitals taken for this visit.       Physical Exam  Constitutional:      General: She is not in acute distress.    Appearance: Normal appearance.  HENT:     Head: Normocephalic.  Pulmonary:     Effort: Pulmonary effort is normal. No  respiratory distress.  Neurological:     Mental Status: She is alert and oriented to person, place, and time. Mental status is at baseline.      Assessment and Plan    COVID-19 Positive test with symptoms of cough, headache, body aches, and fatigue starting on Monday. No fever or respiratory distress. Discussed the risks/benefits of Paxlovid (drug interactions, hyperglycemia, bradycardia, hypotension) vs symptomatic treatment. -Continue symptomatic treatment with over-the-counter medications. -Prescribe Tessalon Perles for cough. If ineffective, patient may use  guaifenesin-dextromethorphan. -Advise use of saline rinse or spray for nasal congestion. -Recommend over-the-counter antihistamines (Allegra, Claritin, Zyrtec, Xyzal) for symptom relief. -Advise patient to contact provider if experiencing wheezing, shortness of breath, or chest tightness for possible albuterol prescription. -Advise patient to quarantine for 5 days and continue mask use and hand hygiene for 10 days from symptom onset.  In the setting of : Diabetes Patient on multiple medications for diabetes management. Last A1C was 7.8. -Continue current diabetes management regimen.  Reactive Airway Disease (2016) No recent exacerbations. -Continue current management.  Seasonal Allergies Currently managed with medication. -Continue current management.  Acid Reflux Currently managed with medication. -Continue current management.  Follow-up Contact provider if symptoms worsen or new symptoms develop.      No follow-ups on file.     I discussed the assessment and treatment plan with the patient. The patient was provided an opportunity to ask questions and all were answered. The patient agreed with the plan and demonstrated an understanding of the instructions.   The patient was advised to call back or seek an in-person evaluation if the symptoms worsen or if the condition fails to improve as anticipated.  I, Debera Lat, PA-C have reviewed all documentation for this visit. The documentation on 05/25/2023  for the exam, diagnosis, procedures, and orders are all accurate and complete.  Debera Lat, [facility], MMS [facility] [phone] (phone) [phone] (fax)  [facility]

## 2023-05-25 NOTE — Telephone Encounter (Signed)
Summary: covid?   Pt called in tested positive for covid, and wants to know what to do and can she get paxlovid         Chief Complaint: Covid Positive Symptoms: Cough, body aches, sore throat, fatigued, headache Frequency:  Monday night symptoms started. Tested positive today Pertinent Negatives: Patient denies fever Disposition: [] ED /[] Urgent Care (no appt availability in office) / [x] Appointment(In office/virtual)/ []  [place] [facility]/ [] Home Care/ [] Refused Recommended Disposition /[] [place] [facility]/ []  Follow-up with PCP Additional Notes:  Pt interested in Paxlovid. Secured VV for 1:00 today. Care advise provided and guidelines reviewed. Pt verbalizes understanding.  Reason for Disposition  [1] HIGH RISK patient (e.g., weak immune system, age > 64 years, obesity with BMI 30 or higher, pregnant, chronic lung disease or other chronic medical condition) AND [2] COVID symptoms (e.g., cough, fever)  (Exceptions: Already seen by PCP and no new or worsening symptoms.)  Answer Assessment - Initial Assessment Questions 1. COVID-19 DIAGNOSIS: "How do you know that you have COVID?" (e.g., positive lab test or self-test, diagnosed by doctor or NP/PA, symptoms after exposure).     Today, home test 2. COVID-19 EXPOSURE: "Was there any known exposure to COVID before the symptoms began?" CDC Definition of close contact: within 6 feet (2 meters) for a total of 15 minutes or more over a 24-hour period.      No 3. ONSET: "When did the COVID-19 symptoms start?"      Monday night 4. WORST SYMPTOM: "What is your worst symptom?" (e.g., cough, fever, shortness of breath, muscle aches)     All 5. COUGH: "Do you have a cough?" If Yes, ask: "How bad is the cough?"       Yes mostly dry 6. FEVER: "Do you have a fever?" If Yes, ask: "What is your temperature, how was it measured, and when did it start?"     No 7. RESPIRATORY STATUS: "Describe your breathing?" (e.g., normal; shortness of breath,  wheezing, unable to speak)      WNL 8. BETTER-SAME-WORSE: "Are you getting better, staying the same or getting worse compared to yesterday?"  If getting worse, ask, "In what way?"     Same 9. OTHER SYMPTOMS: "Do you have any other symptoms?"  (e.g., chills, fatigue, headache, loss of smell or taste, muscle pain, sore throat)     Sore throat, headache, body aches, fatigued 10. HIGH RISK DISEASE: "Do you have any chronic medical problems?" (e.g., asthma, heart or lung disease, weak immune system, obesity, etc.)        11. VACCINE: "Have you had the COVID-19 vaccine?" If Yes, ask: "Which one, how many shots, when did you get it?"       No  Protocols used: Coronavirus (COVID-19) Diagnosed or Suspected-A-AH

## 2023-05-26 NOTE — Addendum Note (Signed)
Addended by: Debera Lat on: 05/26/2023 01:03 PM   Modules accepted: Level of Service

## 2023-06-09 ENCOUNTER — Encounter (HOSPITAL_BASED_OUTPATIENT_CLINIC_OR_DEPARTMENT_OTHER): Payer: Self-pay | Admitting: Orthopaedic Surgery

## 2023-06-10 ENCOUNTER — Other Ambulatory Visit: Payer: Self-pay | Admitting: Family Medicine

## 2023-06-10 DIAGNOSIS — F320 Major depressive disorder, single episode, mild: Secondary | ICD-10-CM

## 2023-06-18 ENCOUNTER — Other Ambulatory Visit: Payer: Self-pay | Admitting: Family Medicine

## 2023-06-18 DIAGNOSIS — F411 Generalized anxiety disorder: Secondary | ICD-10-CM

## 2023-07-25 ENCOUNTER — Encounter: Payer: Self-pay | Admitting: Neurology

## 2023-07-25 ENCOUNTER — Ambulatory Visit: Payer: Self-pay

## 2023-07-25 ENCOUNTER — Ambulatory Visit: Payer: PRIVATE HEALTH INSURANCE | Admitting: Neurology

## 2023-07-25 NOTE — Telephone Encounter (Signed)
  Chief Complaint: Sinus pain/ pressure. Drainage Symptoms: above Frequency: 1 week Pertinent Negatives: Patient denies fever Disposition: [] ED /[] Urgent Care (no appt availability in office) / [x] Appointment(In office/virtual)/ []  [place] [facility]/ [] Home Care/ [] Refused Recommended Disposition /[] [place] [facility]/ []  Follow-up with PCP Additional Notes: Pt states that the mask of her face is full feeling. She states that her sinuses are full. Pt has a lot of post nasal drainage. Pt states that she has a HA as well. Pt will try Advil for pain and swelling. She is taking OTC sinus medication. Appt for weds with [place].    Reason for Disposition  [1] Sinus congestion (pressure, fullness) AND [2] present > 10 days  Answer Assessment - Initial Assessment Questions 1. LOCATION: "Where does it hurt?"      Fore head, under eyes, Sinus congestion 2. ONSET: "When did the sinus pain start?"  (e.g., hours, days)      1 week 3. SEVERITY: "How bad is the pain?"   (Scale 1-10; mild, moderate or severe)   - MILD (1-3): doesn't interfere with normal activities    - MODERATE (4-7): interferes with normal activities (e.g., work or school) or awakens from sleep   - SEVERE (8-10): excruciating pain and patient unable to do any normal activities        7/10 4. RECURRENT SYMPTOM: "Have you ever had sinus problems before?" If Yes, ask: "When was the last time?" and "What happened that time?"      yes 5. NASAL CONGESTION: "Is the nose blocked?" If Yes, ask: "Can you open it or must you breathe through your mouth?"     yes 6. NASAL DISCHARGE: "Do you have discharge from your nose?" If so ask, "What color?"     Greenish tint 7. FEVER: "Do you have a fever?" If Yes, ask: "What is it, how was it measured, and when did it start?"      no 8. OTHER SYMPTOMS: "Do you have any other symptoms?" (e.g., sore throat, cough, earache, difficulty breathing)     Cough  Protocols used: Sinus Pain or  Congestion-A-AH

## 2023-07-27 ENCOUNTER — Telehealth: Payer: PRIVATE HEALTH INSURANCE | Admitting: Family Medicine

## 2023-07-27 ENCOUNTER — Encounter: Payer: Self-pay | Admitting: Family Medicine

## 2023-07-27 DIAGNOSIS — G2581 Restless legs syndrome: Secondary | ICD-10-CM | POA: Diagnosis not present

## 2023-07-27 DIAGNOSIS — J0110 Acute frontal sinusitis, unspecified: Secondary | ICD-10-CM

## 2023-07-27 MED ORDER — AMOXICILLIN 500 MG PO CAPS
500.0000 mg | ORAL_CAPSULE | Freq: Three times a day (TID) | ORAL | 0 refills | Status: AC
Start: 2023-07-27 — End: 2023-08-03

## 2023-07-27 MED ORDER — GABAPENTIN 100 MG PO CAPS: 100.0000 mg | ORAL_CAPSULE | Freq: Every day | ORAL | 3 refills | Status: DC

## 2023-07-27 NOTE — Progress Notes (Signed)
MyChart Video Visit    Virtual Visit via Video Note   This format is felt to be most appropriate for this patient at this time.  Physical exam was limited by quality of the video and audio technology used for the visit.   Patient location: Patient's home address   Provider location: [facility]  [address], [address]  [place], [place] [address]   I discussed the limitations of evaluation and management by telemedicine and the availability of in person appointments. The patient expressed understanding and agreed to proceed.  Patient: Lori [name]   DOB: 1975-06-21   48 y.o. Female  MRN: [id number] Visit Date: 07/27/2023  Today's healthcare provider: Ronnald Ramp, MD   Chief Complaint  Patient presents with   Sinusitis   Subjective    HPI   Discussed the use of AI scribe software for clinical note transcription with the patient, who gave verbal consent to proceed.  History of Present Illness   Lori [name] is a 48 year old female who presents with sinus congestion and headache for 8 days.  She has been experiencing sinus congestion and headache for the past eight days, characterized by a stuffy nose, yellowish-green mucus, and a sensation of mucus stuck in her throat. Facial tenderness is present, particularly when touching her face, with pressure extending to her ear. She has been using Mucinex DM, Flonase, and Tylenol without relief. No cough, fever, or chills. She had a COVID-19 infection about a month ago but states her current symptoms are different. She has been unable to obtain Advil, which was suggested to help relieve pressure and headache pain.  She has a history of restless leg syndrome and has been taking Requip, but her symptoms have worsened. Her legs feel restless, requiring constant movement, and sometimes feel odd to the touch. She has been taking two doses of Requip at times without relief. She has not tried Lyrica or  gabapentin in the past. The leg discomfort is impacting her sleep, as she is not sleeping well.  She is diabetic and mentions that she sits a lot during the day due to her job. She has not had issues with restless legs prior to a few months ago. She works from home and has a sedentary job, which involves sitting for long periods.          Past Medical History:  Diagnosis Date   Bacterial vaginitis    CHF (congestive heart failure) (HCC)    Colon polyps    Complication of anesthesia    nausea/vomiting   Depression    Diabetes mellitus without complication (HCC)    History of abnormal mammogram 2013   @[facility] - NEG MRI AND BX OCT 2014 NORMAL   History of mammogram 03/2015   BREAST MRI AT [facility] WNL   History of Papanicolaou smear of cervix 10/08/2013; 08/04/15   -/-; ASCUS/HPV NEG;   Hodgkin's disease (HCC) 1987   She had surgical resection of Lymph nodes and chemo + rad tx's.   Hypertension    Hypothyroidism    2ND TO CA TX   PONV (postoperative nausea and vomiting)    Restrictive lung disease    Vitamin D deficiency     Medications: Outpatient Medications Prior to Visit  Medication Sig   ALPRAZolam (XANAX) 0.5 MG tablet TAKE 1 TAB AT BEDTIME AS NEEDED FOR ANXIETY AND 1/2 TAB DURING THE DAY FOR PANIC ATTACKS. DO NOT TAKE WITH MUSCLE RELAXANTS   atorvastatin (LIPITOR)  40 MG tablet Take 1 tablet by mouth daily.   benzonatate (TESSALON) 100 MG capsule Take 1 capsule (100 mg total) by mouth 2 (two) times daily as needed for cough.   busPIRone (BUSPAR) 10 MG tablet TAKE 1 TABLET BY MOUTH TWICE A DAY   Continuous Blood Gluc Transmit (DEXCOM G6 TRANSMITTER) MISC USE TO MONITOR BLOOD SUGARS. CHANGE EVERY 90 DAYS.   Continuous Glucose Sensor (DEXCOM G6 SENSOR) MISC SMARTSIG:1 EACH TOPICAL EVERY 10 DAYS   glimepiride (AMARYL) 4 MG tablet Take 2 mg by mouth.   guaiFENesin-dextromethorphan (ROBITUSSIN DM) 100-10 MG/5ML syrup Take 5 mLs by mouth every 4 (four) hours as needed for cough.    levonorgestrel (MIRENA) 20 MCG/24HR IUD 1 each by Intrauterine route once.   levothyroxine (SYNTHROID) 75 MCG tablet Take 75 mcg by mouth daily.   lisinopril (ZESTRIL) 5 MG tablet Take by mouth.   metoprolol succinate (TOPROL-XL) 50 MG 24 hr tablet TAKE 1 TABLET BY MOUTH EVERY DAY (Patient taking differently: Take 50 mg by mouth as needed.)   pantoprazole (PROTONIX) 40 MG tablet TAKE 1 TABLET BY MOUTH EVERY DAY   rOPINIRole (REQUIP) 0.5 MG tablet Take 1 tablet (0.5 mg total) by mouth at bedtime.   sertraline (ZOLOFT) 100 MG tablet Take 1 tablet (100 mg total) by mouth daily.   SYNJARDY XR 12.10-998 MG TB24 Take 2 tablets by mouth every morning.   No facility-administered medications prior to visit.    Review of Systems  Last CBC Lab Results  Component Value Date   WBC 10.4 11/16/2022   HGB 13.7 11/16/2022   HCT 41.1 11/16/2022   MCV 85 11/16/2022   MCH 28.2 11/16/2022   RDW 14.0 11/16/2022   PLT 491 (H) 11/16/2022   Last metabolic panel Lab Results  Component Value Date   GLUCOSE 147 (H) 11/16/2022   NA 139 11/16/2022   K 5.3 (H) 11/16/2022   CL 101 11/16/2022   CO2 17 (L) 11/16/2022   BUN 23 11/16/2022   CREATININE 0.83 11/16/2022   EGFR 87 11/16/2022   CALCIUM 10.5 (H) 11/16/2022   PHOS 2.9 04/11/2014   PROT 8.1 11/16/2022   ALBUMIN 5.0 (H) 11/16/2022   LABGLOB 3.1 11/16/2022   AGRATIO 1.6 11/16/2022   BILITOT 0.2 11/16/2022   ALKPHOS 79 11/16/2022   AST 18 11/16/2022   ALT 20 11/16/2022   ANIONGAP 8 03/02/2017   Last lipids No results found for: "CHOL", "HDL", "LDLCALC", "LDLDIRECT", "TRIG", "CHOLHDL" Last hemoglobin A1c Lab Results  Component Value Date   HGBA1C 7.9 02/18/2022   Last thyroid functions Lab Results  Component Value Date   TSH 0.215 (L) 10/14/2021         Objective    There were no vitals taken for this visit.  BP Readings from Last 3 Encounters:  04/11/23 92/66  03/10/23 (!) 108/59  12/15/22 97/69   Wt Readings from Last 3  Encounters:  04/11/23 152 lb (68.9 kg)  03/10/23 148 lb 9.6 oz (67.4 kg)  12/15/22 146 lb 6.4 oz (66.4 kg)        Physical Exam Constitutional:      General: She is not in acute distress.    Appearance: Normal appearance. She is not ill-appearing.  Pulmonary:     Effort: Pulmonary effort is normal. No respiratory distress.  Neurological:     Mental Status: She is alert and oriented to person, place, and time.        Assessment & Plan  Problem List Items Addressed This Visit       Other   Restless legs   Relevant Medications   gabapentin (NEURONTIN) 100 MG capsule   Other Relevant Orders   B12   Comprehensive metabolic panel   CBC   Magnesium   TSH+T4F+T3Free   Other Visit Diagnoses       Acute non-recurrent frontal sinusitis    -  Primary   Relevant Medications   amoxicillin (AMOXIL) 500 MG capsule           Acute Sinusitis Eight-day history of sinus congestion, headache, and facial tenderness with yellow-green nasal discharge and postnasal drip, suggestive of bacterial sinusitis post-viral URI. No fever or chills. Discussed amoxicillin, including potential gastrointestinal upset and allergic reactions. Emphasized completing the full course to prevent resistance. Advised increased fluid intake and Advil for symptom relief. - Prescribe amoxicillin 500 mg, TID for 7 days - Continue Flonase - Increase fluid intake - Use Advil or Motrin for headache pain  Restless Leg Syndrome Persistent nocturnal symptoms despite Requip (ropinirole) 0.5 mg, causing sleep disturbances. No prior gabapentin or Lyrica use. Considering magnesium deficiency. Discussed Magnilive cream, increasing Requip, and introducing gabapentin, including potential drowsiness. Importance of checking B12, magnesium, and thyroid function discussed. - Try Magnilive relaxing leg cream - Increase Requip to 1 mg at night if symptoms persist - Prescribe gabapentin 100 mg at bedtime - Start magnesium  oxide 400 mg daily - Order B12, CMP, CBC, magnesium levels, and thyroid function tests  Diabetes Mellitus Diabetes managed by endocrinologist. Last A1c 8.0. Currently on Januvia with improved control. - Continue current management with endocrinologist - Follow up with endocrinologist in April  Follow-up - Follow up in office on March 6 at 1:00 PM - Complete lab work prior to follow-up visit.         No follow-ups on file.     I discussed the assessment and treatment plan with the patient. The patient was provided an opportunity to ask questions and all were answered. The patient agreed with the plan and demonstrated an understanding of the instructions.   The patient was advised to call back or seek an in-person evaluation if the symptoms worsen or if the condition fails to improve as anticipated.  I provided 16 minutes of non-face-to-face time during this encounter.   Ronnald Ramp, MD [facility] [phone] (phone) [phone] (fax)  [facility]

## 2023-07-27 NOTE — Patient Instructions (Signed)
VISIT SUMMARY:  Today, you were seen for sinus congestion and headache that have been ongoing for eight days, as well as worsening symptoms of restless leg syndrome. We also reviewed your diabetes management.  YOUR PLAN:  -ACUTE SINUSITIS: Acute sinusitis is an infection of the sinuses that can cause congestion, headache, and facial tenderness. You will start a course of amoxicillin 500 mg three times a day for 7 days. Continue using Flonase, increase your fluid intake, and take Advil or Motrin to help with headache pain.  -RESTLESS LEG SYNDROME: Restless leg syndrome is a condition that causes an uncontrollable urge to move your legs, often interfering with sleep. You will try Magnilive relaxing leg cream and increase your Requip dose to 1 mg at night if symptoms persist. Additionally, you will start taking gabapentin 100 mg at bedtime and magnesium oxide 400 mg daily. We will also check your B12, magnesium, and thyroid function levels.  -DIABETES MELLITUS: Diabetes mellitus is a condition that affects your blood sugar levels. Your diabetes is currently managed by your endocrinologist, and you should continue with your current treatment plan. Your last A1c was 8.0, and you are on Januvia with improved control.  INSTRUCTIONS:  Please follow up in our office on March 6 at 1:40 PM instead of the 1PM we discussed. (The system blocked me from scheduling the 1PM slot, please call us if this doesn't work for you)   Make sure to complete your lab work before this visit.

## 2023-07-28 ENCOUNTER — Telehealth: Payer: PRIVATE HEALTH INSURANCE | Admitting: Physician Assistant

## 2023-07-29 ENCOUNTER — Ambulatory Visit: Payer: PRIVATE HEALTH INSURANCE | Admitting: Family Medicine

## 2023-08-04 ENCOUNTER — Encounter: Payer: Self-pay | Admitting: Family Medicine

## 2023-08-04 ENCOUNTER — Other Ambulatory Visit: Payer: Self-pay

## 2023-08-04 DIAGNOSIS — D72828 Other elevated white blood cell count: Secondary | ICD-10-CM

## 2023-08-04 LAB — COMPREHENSIVE METABOLIC PANEL
ALT: 24 [IU]/L (ref 0–32)
AST: 22 [IU]/L (ref 0–40)
Albumin: 4.9 g/dL (ref 3.9–4.9)
Alkaline Phosphatase: 88 [IU]/L (ref 44–121)
BUN/Creatinine Ratio: 27 — ABNORMAL HIGH (ref 9–23)
BUN: 20 mg/dL (ref 6–24)
Bilirubin Total: 0.3 mg/dL (ref 0.0–1.2)
CO2: 20 mmol/L (ref 20–29)
Calcium: 10 mg/dL (ref 8.7–10.2)
Chloride: 100 mmol/L (ref 96–106)
Creatinine, Ser: 0.74 mg/dL (ref 0.57–1.00)
Globulin, Total: 2.8 g/dL (ref 1.5–4.5)
Glucose: 143 mg/dL — ABNORMAL HIGH (ref 70–99)
Potassium: 5.1 mmol/L (ref 3.5–5.2)
Sodium: 139 mmol/L (ref 134–144)
Total Protein: 7.7 g/dL (ref 6.0–8.5)
eGFR: 100 mL/min/{1.73_m2} (ref >59)

## 2023-08-04 LAB — CBC
Hematocrit: 41.6 % (ref 34.0–46.6)
Hemoglobin: 13.7 g/dL (ref 11.1–15.9)
MCH: 29.3 pg (ref 26.6–33.0)
MCHC: 32.9 g/dL (ref 31.5–35.7)
MCV: 89 fL (ref 79–97)
Platelets: 482 10*3/uL — ABNORMAL HIGH (ref 150–450)
RBC: 4.67 x10E6/uL (ref 3.77–5.28)
RDW: 13.9 % (ref 11.7–15.4)
WBC: 15.8 10*3/uL — ABNORMAL HIGH (ref 3.4–10.8)

## 2023-08-04 LAB — MAGNESIUM: Magnesium: 1.8 mg/dL (ref 1.6–2.3)

## 2023-08-04 LAB — TSH+T4F+T3FREE
Free T4: 1.24 ng/dL (ref 0.82–1.77)
T3, Free: 2.2 pg/mL (ref 2.0–4.4)
TSH: 4.46 u[IU]/mL (ref 0.450–4.500)

## 2023-08-04 LAB — VITAMIN B12: Vitamin B-12: 679 pg/mL (ref 232–1245)

## 2023-08-05 ENCOUNTER — Ambulatory Visit: Payer: BC Managed Care – PPO | Admitting: Physician Assistant

## 2023-08-11 ENCOUNTER — Ambulatory Visit: Payer: PRIVATE HEALTH INSURANCE | Admitting: Family Medicine

## 2023-08-11 ENCOUNTER — Encounter: Payer: Self-pay | Admitting: Family Medicine

## 2023-08-11 VITALS — BP 107/70 | HR 75 | Ht 66.0 in | Wt 153.0 lb

## 2023-08-11 DIAGNOSIS — G2581 Restless legs syndrome: Secondary | ICD-10-CM

## 2023-08-11 DIAGNOSIS — R0981 Nasal congestion: Secondary | ICD-10-CM

## 2023-08-11 MED ORDER — PREDNISONE 20 MG PO TABS: ORAL_TABLET | ORAL | 0 refills | Status: AC

## 2023-08-11 NOTE — Progress Notes (Signed)
 Established patient visit   Patient: Lori [name]   DOB: Oct 28, 1975   48 y.o. Female  MRN: [id number] Visit Date: 08/11/2023  Today's healthcare provider: Ronnald Ramp, MD   Chief Complaint  Patient presents with   Sinusitis    Everthing is gone but the headache and some face pain   Subjective     HPI     Sinusitis    Additional comments: Everthing is gone but the headache and some face pain      Last edited by Thedora Hinders, CMA on 08/11/2023  2:12 PM.       Discussed the use of AI scribe software for clinical note transcription with the patient, who gave verbal consent to proceed.  History of Present Illness   Lori [name] is a 48 year old female who presents for follow-up after treatment for sinusitis.  She completed a course of amoxicillin for sinusitis, which provided some relief, but she continues to experience a sensation of fullness in her head and a headache for the past three days. The headache is severe, particularly under her eyes, and she feels stuffy in the mornings with throat congestion that improves throughout the day. She has been using Tylenol, Motrin, and Goody's powders for headache relief. No fever or cough is present.  She is taking gabapentin 100 mg at night for restless legs syndrome, which has significantly reduced her symptoms, though she still feels a small urge to move her legs. Her current prescription is not yet depleted.  For anxiety, she is on Buspar 10 mg twice daily, which has been effective during a period of increased anxiety. She has a history of long-term Zoloft use, which she feels maintains her stability. She is considering tapering off Buspar as her anxiety has improved.  Her white blood cell count was elevated at 15 during her last blood work, and she has a history of slightly elevated counts. A follow-up blood test is scheduled at the end of the month to monitor this. She speculates that her recent illness might  have contributed to the elevated count.        Past Medical History:  Diagnosis Date   Bacterial vaginitis    CHF (congestive heart failure) (HCC)    Colon polyps    Complication of anesthesia    nausea/vomiting   Depression    Diabetes mellitus without complication (HCC)    History of abnormal mammogram 2013   @[facility] - NEG MRI AND BX OCT 2014 NORMAL   History of mammogram 03/2015   BREAST MRI AT [facility] WNL   History of Papanicolaou smear of cervix 10/08/2013; 08/04/15   -/-; ASCUS/HPV NEG;   Hodgkin's disease (HCC) 1987   She had surgical resection of Lymph nodes and chemo + rad tx's.   Hypertension    Hypothyroidism    2ND TO CA TX   PONV (postoperative nausea and vomiting)    Restrictive lung disease    Vitamin D deficiency     Medications: Outpatient Medications Prior to Visit  Medication Sig   ALPRAZolam (XANAX) 0.5 MG tablet TAKE 1 TAB AT BEDTIME AS NEEDED FOR ANXIETY AND 1/2 TAB DURING THE DAY FOR PANIC ATTACKS. DO NOT TAKE WITH MUSCLE RELAXANTS   atorvastatin (LIPITOR) 40 MG tablet Take 1 tablet by mouth daily.   benzonatate (TESSALON) 100 MG capsule Take 1 capsule (100 mg total) by mouth 2 (two) times daily as needed for cough.   busPIRone (BUSPAR) 10 MG  tablet TAKE 1 TABLET BY MOUTH TWICE A DAY   Continuous Blood Gluc Transmit (DEXCOM G6 TRANSMITTER) MISC USE TO MONITOR BLOOD SUGARS. CHANGE EVERY 90 DAYS.   Continuous Glucose Sensor (DEXCOM G6 SENSOR) MISC SMARTSIG:1 EACH TOPICAL EVERY 10 DAYS   gabapentin (NEURONTIN) 100 MG capsule Take 1 capsule (100 mg total) by mouth at bedtime.   glimepiride (AMARYL) 4 MG tablet Take 2 mg by mouth.   guaiFENesin-dextromethorphan (ROBITUSSIN DM) 100-10 MG/5ML syrup Take 5 mLs by mouth every 4 (four) hours as needed for cough.   levonorgestrel (MIRENA) 20 MCG/24HR IUD 1 each by Intrauterine route once.   levothyroxine (SYNTHROID) 75 MCG tablet Take 75 mcg by mouth daily.   lisinopril (ZESTRIL) 5 MG tablet Take by mouth.    metoprolol succinate (TOPROL-XL) 50 MG 24 hr tablet TAKE 1 TABLET BY MOUTH EVERY DAY (Patient taking differently: Take 50 mg by mouth as needed.)   pantoprazole (PROTONIX) 40 MG tablet TAKE 1 TABLET BY MOUTH EVERY DAY   rOPINIRole (REQUIP) 0.5 MG tablet Take 1 tablet (0.5 mg total) by mouth at bedtime. (Patient not taking: Reported on 08/11/2023)   sertraline (ZOLOFT) 100 MG tablet Take 1 tablet (100 mg total) by mouth daily.   SYNJARDY XR 12.10-998 MG TB24 Take 2 tablets by mouth every morning.   No facility-administered medications prior to visit.    Review of Systems  Last CBC Lab Results  Component Value Date   WBC 15.8 (H) 08/03/2023   HGB 13.7 08/03/2023   HCT 41.6 08/03/2023   MCV 89 08/03/2023   MCH 29.3 08/03/2023   RDW 13.9 08/03/2023   PLT 482 (H) 08/03/2023   Last metabolic panel Lab Results  Component Value Date   GLUCOSE 143 (H) 08/03/2023   NA 139 08/03/2023   K 5.1 08/03/2023   CL 100 08/03/2023   CO2 20 08/03/2023   BUN 20 08/03/2023   CREATININE 0.74 08/03/2023   EGFR 100 08/03/2023   CALCIUM 10.0 08/03/2023   PHOS 2.9 04/11/2014   PROT 7.7 08/03/2023   ALBUMIN 4.9 08/03/2023   LABGLOB 2.8 08/03/2023   AGRATIO 1.6 11/16/2022   BILITOT 0.3 08/03/2023   ALKPHOS 88 08/03/2023   AST 22 08/03/2023   ALT 24 08/03/2023   ANIONGAP 8 03/02/2017   Last hemoglobin A1c Lab Results  Component Value Date   HGBA1C 7.9 02/18/2022   Last thyroid functions Lab Results  Component Value Date   TSH 4.460 08/03/2023        Objective    BP 107/70   Pulse 75   Ht 5\' 6"  (1.676 m)   Wt 153 lb (69.4 kg)   SpO2 99%   BMI 24.69 kg/m  BP Readings from Last 3 Encounters:  08/11/23 107/70  04/11/23 92/66  03/10/23 (!) 108/59   Wt Readings from Last 3 Encounters:  08/11/23 153 lb (69.4 kg)  04/11/23 152 lb (68.9 kg)  03/10/23 148 lb 9.6 oz (67.4 kg)        Physical Exam  General: Alert, no acute distress HEENT: MMM, no oropharyngeal erythema, no  exudate, frontal sinus tenderness to palpation Cardio: Normal S1 and S2, RRR, no r/m/g Pulm: CTAB, normal work of breathing Neuro: PERRLA, 5/5 strength in bilateral upper extremities   No results found for any visits on 08/11/23.  Assessment & Plan     Problem List Items Addressed This Visit       Other   Restless legs - Primary   Other Visit Diagnoses  Head congestion       Relevant Medications   predniSONE (DELTASONE) 20 MG tablet           Sinusitis Persistent headache and fullness despite completing amoxicillin. Symptoms include morning congestion and throat mucus, improving throughout the day. No fever or cough. Differential includes sinus headache, tension headache, or medication overuse headache. Physical exam reveals sinus tenderness. Discussed prednisone for inflammation, potential medication overuse headache from Tylenol and Motrin, and cautious use of decongestants due to potential hypertension. - Prescribe prednisone 40 mg for 3 days, then 20 mg for 3 more days. - Allow use of Tylenol or Motrin for headache relief. - Consider over-the-counter decongestants like Mucinex or Sinex if symptoms persist.  Restless Legs Syndrome Significant improvement with gabapentin 100 mg at night. Mild residual urge to move legs. Discussed increasing gabapentin dosage to enhance symptom control, with gradual increase to monitor tolerance and effectiveness. - Increase gabapentin to 200 mg at night for 1-3 nights, then consider increasing to 300 mg if needed.  Elevated White Blood Cell Count Previous lab results showed WBC count of 15, fluctuating between 10 and 17. Plan to monitor to rule out infection or other causes. - Order repeat CBC around March 31st to monitor WBC count.  Anxiety Improvement with Buspar 10 mg twice daily. Discussed potential tapering if symptoms remain controlled. Explained Buspar's lack of dependency potential and gradual tapering process. - Consider  tapering Buspar to once daily for 1-2 weeks, then discontinue if symptoms remain controlled.  General Health Maintenance Thyroid levels and metabolic panel within normal limits. - Continue routine monitoring of thyroid levels and metabolic panel as needed.  Follow-up - Schedule follow-up CBC around March 31st. - Return for lab work without needing an appointment.         No follow-ups on file.         Ronnald Ramp, MD  [facility] [phone] (phone) [phone] (fax)  [facility]

## 2023-09-06 LAB — VITAMIN D 25 HYDROXY (VIT D DEFICIENCY, FRACTURES): Vit D, 25-Hydroxy: 19.8

## 2023-09-12 ENCOUNTER — Other Ambulatory Visit: Payer: Self-pay | Admitting: Oncology

## 2023-09-12 DIAGNOSIS — T66XXXS Radiation sickness, unspecified, sequela: Secondary | ICD-10-CM

## 2023-09-12 DIAGNOSIS — Z9189 Other specified personal risk factors, not elsewhere classified: Secondary | ICD-10-CM

## 2023-09-19 ENCOUNTER — Encounter: Payer: Self-pay | Admitting: Family Medicine

## 2023-09-19 NOTE — Telephone Encounter (Signed)
Please see the message below and advise.

## 2023-09-21 ENCOUNTER — Other Ambulatory Visit: Payer: Self-pay | Admitting: Family Medicine

## 2023-09-21 DIAGNOSIS — G43719 Chronic migraine without aura, intractable, without status migrainosus: Secondary | ICD-10-CM

## 2023-09-21 LAB — OPHTHALMOLOGY REPORT-SCANNED

## 2023-09-21 NOTE — Progress Notes (Signed)
 Patient requesting referral for migraine headaches

## 2023-09-22 ENCOUNTER — Encounter: Payer: Self-pay | Admitting: Physician Assistant

## 2023-09-22 ENCOUNTER — Ambulatory Visit: Payer: PRIVATE HEALTH INSURANCE | Admitting: Physician Assistant

## 2023-09-22 VITALS — BP 92/56 | HR 73 | Resp 16 | Ht 65.0 in | Wt 152.3 lb

## 2023-09-22 DIAGNOSIS — R102 Pelvic and perineal pain: Secondary | ICD-10-CM | POA: Diagnosis not present

## 2023-09-22 DIAGNOSIS — Z7984 Long term (current) use of oral hypoglycemic drugs: Secondary | ICD-10-CM | POA: Diagnosis not present

## 2023-09-22 DIAGNOSIS — N39498 Other specified urinary incontinence: Secondary | ICD-10-CM

## 2023-09-22 DIAGNOSIS — R300 Dysuria: Secondary | ICD-10-CM

## 2023-09-22 LAB — POCT URINALYSIS DIPSTICK
Bilirubin, UA: POSITIVE
Blood, UA: POSITIVE
Glucose, UA: POSITIVE — AB
Ketones, UA: POSITIVE
Leukocytes, UA: NEGATIVE
Nitrite, UA: NEGATIVE
Protein, UA: POSITIVE — AB
Spec Grav, UA: 1.01 (ref 1.010–1.025)
Urobilinogen, UA: 0.2 U/dL
pH, UA: 6.5 (ref 5.0–8.0)

## 2023-09-22 NOTE — Progress Notes (Signed)
 Established patient visit  Patient: Lori [name]   DOB: 1976/05/05   48 y.o. Female  MRN: [id number] Visit Date: 09/22/2023  Today's healthcare provider: Blane Bunting, PA-C   Chief Complaint  Patient presents with   Urinary Tract Infection    Possible UTI?   Subjective      Discussed the use of AI scribe software for clinical note transcription with the patient, who gave verbal consent to proceed.  History of Present Illness The patient, with a history of diabetes and incontinence, presents with symptoms suggestive of a urinary tract infection (UTI) that started three days prior. She describes pelvic pain and discomfort during urination, along with frequent urges to urinate but with little output. She denies any recent gastrointestinal distress or symptoms suggestive of sexually transmitted diseases. The patient also mentions occasional incontinence, particularly when she has a strong urge to urinate or during physical activities like coughing or laughing. She also reports occasional flank pain and nausea, which she attributes to her diabetes medication.       09/22/2023    8:10 AM 04/11/2023    8:39 AM 03/10/2023   11:01 AM  Depression screen PHQ 2/9  Decreased Interest 0 2 3  Down, Depressed, Hopeless 0 2 2  PHQ - 2 Score 0 4 5  Altered sleeping 0 2 3  Tired, decreased energy 0 2 3  Change in appetite 0 0 3  Feeling bad or failure about yourself  0 1 2  Trouble concentrating 0 2 2  Moving slowly or fidgety/restless 0 1 2  Suicidal thoughts 0 0 0  PHQ-9 Score 0 12 20  Difficult doing work/chores Not difficult at all Somewhat difficult       09/22/2023    8:10 AM 04/11/2023    8:39 AM 03/10/2023   11:01 AM 10/12/2021    4:18 PM  GAD 7 : Generalized Anxiety Score  Nervous, Anxious, on Edge 0 1 2 1   Control/stop worrying 0 1 2 1   Worry too much - different things 0 1 2   Trouble relaxing 0 1 2 3   Restless 0 1 2   Easily annoyed or irritable 0 2 3 3   Afraid - awful might  happen 0 1 2 1   Total GAD 7 Score 0 8 15   Anxiety Difficulty Not difficult at all Somewhat difficult  Somewhat difficult    Medications: Outpatient Medications Prior to Visit  Medication Sig   ALPRAZolam  (XANAX ) 0.5 MG tablet TAKE 1 TAB AT BEDTIME AS NEEDED FOR ANXIETY AND 1/2 TAB DURING THE DAY FOR PANIC ATTACKS. DO NOT TAKE WITH MUSCLE RELAXANTS   atorvastatin (LIPITOR) 40 MG tablet Take 1 tablet by mouth daily.   Continuous Blood Gluc Transmit (DEXCOM G6 TRANSMITTER) MISC USE TO MONITOR BLOOD SUGARS. CHANGE EVERY 90 DAYS.   Continuous Glucose Sensor (DEXCOM G6 SENSOR) MISC SMARTSIG:1 EACH TOPICAL EVERY 10 DAYS   gabapentin  (NEURONTIN ) 100 MG capsule Take 1 capsule (100 mg total) by mouth at bedtime.   levonorgestrel  (MIRENA ) 20 MCG/24HR IUD 1 each by Intrauterine route once.   levothyroxine (SYNTHROID) 75 MCG tablet Take 75 mcg by mouth daily.   lisinopril (ZESTRIL) 5 MG tablet Take by mouth.   metoprolol  succinate (TOPROL -XL) 50 MG 24 hr tablet TAKE 1 TABLET BY MOUTH EVERY DAY (Patient taking differently: Take 50 mg by mouth as needed.)   pantoprazole  (PROTONIX ) 40 MG tablet TAKE 1 TABLET BY MOUTH EVERY DAY   sertraline  (ZOLOFT ) 100 MG tablet Take 1  tablet (100 mg total) by mouth daily.   SYNJARDY XR 12.10-998 MG TB24 Take 2 tablets by mouth every morning.   benzonatate  (TESSALON ) 100 MG capsule Take 1 capsule (100 mg total) by mouth 2 (two) times daily as needed for cough. (Patient not taking: Reported on 09/22/2023)   busPIRone  (BUSPAR ) 10 MG tablet TAKE 1 TABLET BY MOUTH TWICE A DAY (Patient not taking: Reported on 09/22/2023)   glimepiride (AMARYL) 4 MG tablet Take 2 mg by mouth.   guaiFENesin -dextromethorphan (ROBITUSSIN DM) 100-10 MG/5ML syrup Take 5 mLs by mouth every 4 (four) hours as needed for cough. (Patient not taking: Reported on 09/22/2023)   rOPINIRole  (REQUIP ) 0.5 MG tablet Take 1 tablet (0.5 mg total) by mouth at bedtime. (Patient not taking: Reported on 09/22/2023)   No  facility-administered medications prior to visit.    Review of Systems All negative Except see HPI       Objective    BP (!) 92/56 (BP Location: Left Arm, Patient Position: Sitting, Cuff Size: Normal)   Pulse 73   Resp 16   Ht 5\' 5"  (1.651 m)   Wt 152 lb 4.8 oz (69.1 kg)   SpO2 98%   BMI 25.34 kg/m     Physical Exam Vitals reviewed.  Constitutional:      General: She is not in acute distress.    Appearance: She is well-developed.  HENT:     Head: Normocephalic and atraumatic.  Eyes:     General: No scleral icterus.    Conjunctiva/sclera: Conjunctivae normal.  Cardiovascular:     Rate and Rhythm: Normal rate and regular rhythm.     Heart sounds: Normal heart sounds. No murmur heard. Pulmonary:     Effort: Pulmonary effort is normal. No respiratory distress.     Breath sounds: Normal breath sounds. No wheezing or rales.  Abdominal:     General: There is no distension.     Palpations: Abdomen is soft.     Tenderness: There is abdominal tenderness in the suprapubic area. There is no guarding or rebound.  Skin:    General: Skin is warm and dry.     Capillary Refill: Capillary refill takes less than 2 seconds.     Findings: No rash.  Neurological:     Mental Status: She is alert and oriented to person, place, and time.  Psychiatric:        Behavior: Behavior normal.     No results found for any visits on 09/22/23.      Assessment & Plan Pelvic pain/Dysuria  Could be due to Urinary Tract Infection (UTI) Symptoms suggestive of UTI but initial tests negative. Differential includes dehydration, especially with diabetes and medication use. - Order urine culture and microscopy. - Advise increased fluid intake, including water and cranberry juice. - Recommend AZO for symptomatic relief for 2-3 days. - Reassess based on urine culture results. Will follow-up  Type 2 Diabetes Mellitus Chronic and stable Diabetes managed with Synjardy and Januvia. Occasional nausea  possibly medication-related. Kidney function and glucose levels normal. - Encourage adequate hydration to prevent dehydration and potential UTI. - Monitor blood glucose levels regularly. Will follow-up with PCP  Urinary Incontinence Urge and stress incontinence possibly related to childbirth and age. No prolapse noted. Further evaluation recommended. - Discuss incontinence with gynecologist at upcoming appointment. - Consider referral to a specialist in urogynecology if needed. - Encourage regular Kegel exercises. Will follow-up   No orders of the defined types were placed in this encounter.  No follow-ups on file.   The patient was advised to call back or seek an in-person evaluation if the symptoms worsen or if the condition fails to improve as anticipated.  I discussed the assessment and treatment plan with the patient. The patient was provided an opportunity to ask questions and all were answered. The patient agreed with the plan and demonstrated an understanding of the instructions.  I, Leola Fiore, PA-C have reviewed all documentation for this visit. The documentation on 09/22/2023  for the exam, diagnosis, procedures, and orders are all accurate and complete.  Blane Bunting, [facility], MMS [facility] [phone] (phone) [phone] (fax)  [facility]

## 2023-09-23 LAB — URINALYSIS, MICROSCOPIC ONLY: Casts: NONE SEEN /LPF

## 2023-09-26 ENCOUNTER — Encounter: Payer: Self-pay | Admitting: Physician Assistant

## 2023-09-26 DIAGNOSIS — N390 Urinary tract infection, site not specified: Secondary | ICD-10-CM

## 2023-09-26 LAB — URINE CULTURE

## 2023-09-26 MED ORDER — NITROFURANTOIN MONOHYD MACRO 100 MG PO CAPS
100.0000 mg | ORAL_CAPSULE | Freq: Two times a day (BID) | ORAL | 0 refills | Status: AC
Start: 2023-09-26 — End: 2023-10-03

## 2023-09-26 NOTE — Telephone Encounter (Signed)
 Please see the message below the pt is requesting his lab results

## 2023-09-26 NOTE — Progress Notes (Signed)
 Spoke with pt. Med will be called in. If yeast infection suspected, pt could use monistat 7 otc. Pt needs to schedule a follow-up appointment with us 

## 2023-10-12 ENCOUNTER — Telehealth: Payer: Self-pay

## 2023-10-12 NOTE — Telephone Encounter (Signed)
 Copied from CRM [id number]. Topic: General - Other >> Oct 12, 2023  9:53 AM Sophia H wrote: Reason for CRM: Pt called in wondering if she needed to schedule an appointment with PA Janna to follow up on her possible UTI or if she could just come by and do a urine sample. Please advise, best contact # [phone]

## 2023-10-13 NOTE — Telephone Encounter (Signed)
 Patient called back to check status of this request. She states appts were far out and she wanted to know if she needed a f/u appt or just labs. Thank You

## 2023-10-14 ENCOUNTER — Encounter: Payer: Self-pay | Admitting: Physician Assistant

## 2023-10-14 DIAGNOSIS — N390 Urinary tract infection, site not specified: Secondary | ICD-10-CM

## 2023-10-17 ENCOUNTER — Encounter: Payer: Self-pay | Admitting: Oncology

## 2023-10-18 LAB — URINALYSIS, ROUTINE W REFLEX MICROSCOPIC
Bilirubin, UA: NEGATIVE
Ketones, UA: NEGATIVE
Leukocytes,UA: NEGATIVE
Nitrite, UA: NEGATIVE
Protein,UA: NEGATIVE
RBC, UA: NEGATIVE
Specific Gravity, UA: >=1.03 — AB (ref 1.005–1.030)
Urobilinogen, Ur: 0.2 mg/dL (ref 0.2–1.0)
pH, UA: 6 (ref 5.0–7.5)

## 2023-10-20 ENCOUNTER — Inpatient Hospital Stay: Admission: RE | Admit: 2023-10-20 | Payer: PRIVATE HEALTH INSURANCE | Source: Ambulatory Visit

## 2023-11-04 ENCOUNTER — Ambulatory Visit
Admission: RE | Admit: 2023-11-04 | Discharge: 2023-11-04 | Disposition: A | Discharge status: Home / Self Care | Payer: PRIVATE HEALTH INSURANCE | Source: Ambulatory Visit | Attending: Oncology | Admitting: Oncology

## 2023-11-04 DIAGNOSIS — T66XXXS Radiation sickness, unspecified, sequela: Secondary | ICD-10-CM

## 2023-11-04 DIAGNOSIS — Z9189 Other specified personal risk factors, not elsewhere classified: Secondary | ICD-10-CM

## 2023-11-04 MED ORDER — GADOPICLENOL 0.5 MMOL/ML IV SOLN
7.0000 mL | Freq: Once | INTRAVENOUS | Status: AC | PRN
  Administered 2023-11-04: 7 mL via INTRAVENOUS

## 2023-11-07 ENCOUNTER — Encounter: Payer: Self-pay | Admitting: Family Medicine

## 2023-11-09 ENCOUNTER — Ambulatory Visit: Payer: Self-pay

## 2023-11-09 ENCOUNTER — Ambulatory Visit: Payer: PRIVATE HEALTH INSURANCE | Admitting: Family Medicine

## 2023-11-09 ENCOUNTER — Encounter: Payer: Self-pay | Admitting: Family Medicine

## 2023-11-09 VITALS — BP 108/75 | HR 80 | Ht 65.0 in | Wt 155.0 lb

## 2023-11-09 DIAGNOSIS — R61 Generalized hyperhidrosis: Secondary | ICD-10-CM

## 2023-11-09 DIAGNOSIS — R5383 Other fatigue: Secondary | ICD-10-CM | POA: Diagnosis not present

## 2023-11-09 DIAGNOSIS — E039 Hypothyroidism, unspecified: Secondary | ICD-10-CM | POA: Diagnosis not present

## 2023-11-09 DIAGNOSIS — G479 Sleep disorder, unspecified: Secondary | ICD-10-CM | POA: Diagnosis not present

## 2023-11-09 DIAGNOSIS — R519 Headache, unspecified: Secondary | ICD-10-CM

## 2023-11-09 DIAGNOSIS — E119 Type 2 diabetes mellitus without complications: Secondary | ICD-10-CM

## 2023-11-09 DIAGNOSIS — G8929 Other chronic pain: Secondary | ICD-10-CM

## 2023-11-09 MED ORDER — PREDNISONE 20 MG PO TABS: 40.0000 mg | ORAL_TABLET | Freq: Every day | ORAL | 0 refills | Status: AC

## 2023-11-09 NOTE — Telephone Encounter (Signed)
 FYI Only or Action Required?: Action required by provider PCP instructed patient to make an appointment, none available until July.  This RN call [facility] and was asked to send CRM over for scheduling. Patient was last seen in primary care on 08/11/2023 by Mimi Alt, MD. Called Nurse Triage reporting No chief complaint on file.. Symptoms began a week ago. Interventions attempted: Nothing. Symptoms are: unchanged.  Triage Disposition: No disposition on file.  Patient/caregiver understands and will follow disposition?:   Copied from CRM [id number]. Topic: Clinical - Medical Advice >> Nov 09, 2023 11:40 AM Chrystal Crape R wrote: Pt called to schedule a acute visit for fatigue as per Dr. Saralee Cummins request via mychart however new/ worst symptoms active in decision tree denied scheduling. Acute appointments not avail until July

## 2023-11-09 NOTE — Progress Notes (Signed)
 Established patient visit   Patient: Lori [name]   DOB: 11-10-1975   48 y.o. Female  MRN: [id number] Visit Date: 11/09/2023  Today's healthcare provider: Mimi Alt, MD   Chief Complaint  Patient presents with   Fatigue    She has been feeing fatigue for about a week in a half (possible uti )    Subjective     HPI     Fatigue    Additional comments: She has been feeing fatigue for about a week in a half (possible uti )       Last edited by Bart Lieu, CMA on 11/09/2023  3:54 PM.       Discussed the use of AI scribe software for clinical note transcription with the patient, who gave verbal consent to proceed.  History of Present Illness Lori [name] is a 48 year old female who presents with persistent fatigue and follow-up on lab results.  She has been experiencing persistent fatigue for over a week, describing it as having 'absolutely no energy whatsoever.' Despite taking melatonin and gabapentin , she continues to experience fatigue. She notes difficulty sleeping, often waking up drenched in sweat, which she believes is related to being premenopausal. No sore throat, joint pain, nausea, or significant illness, but she mentions a bad headache persisting for a couple of days.  She has a history of urinary tract infections, with a recent episode involving a strain of E. coli for which she completed a course of antibiotics. She reports a previous hospitalization over a year ago due to a severe UTI. She is concerned about potential UTI symptoms but has not received follow-up results from a recent urinalysis.  Her past medical history includes low vitamin D  levels, for which she takes 5000 IU daily. She also has a history of diabetes, managed with insulin , and mentions a previous elevated glucose level of 143. She is on gabapentin  for leg issues and takes melatonin for sleep. She has an IUD in place and is experiencing symptoms she associates with  perimenopause.  She works from home and frequently babysits her one-year-old grandchild, which she acknowledges as physically demanding. No recent vomiting, coughing, or significant illness but feels generally unwell and fatigued.     Past Medical History:  Diagnosis Date   Bacterial vaginitis    CHF (congestive heart failure) (HCC)    Colon polyps    Complication of anesthesia    nausea/vomiting   Depression    Diabetes mellitus without complication (HCC)    History of abnormal mammogram 2013   @[facility] - NEG MRI AND BX OCT 2014 NORMAL   History of mammogram 03/2015   BREAST MRI AT [facility] WNL   History of Papanicolaou smear of cervix 10/08/2013; 08/04/15   -/-; ASCUS/HPV NEG;   Hodgkin's disease (HCC) 1987   She had surgical resection of Lymph nodes and chemo + rad tx's.   Hypertension    Hypothyroidism    2ND TO CA TX   PONV (postoperative nausea and vomiting)    Restrictive lung disease    Vitamin D  deficiency     Medications: Outpatient Medications Prior to Visit  Medication Sig   ALPRAZolam  (XANAX ) 0.5 MG tablet TAKE 1 TAB AT BEDTIME AS NEEDED FOR ANXIETY AND 1/2 TAB DURING THE DAY FOR PANIC ATTACKS. DO NOT TAKE WITH MUSCLE RELAXANTS   Atogepant 60 MG TABS Take 60 mg by mouth.   atorvastatin (LIPITOR) 40 MG tablet Take 1 tablet by mouth daily.  BD PEN NEEDLE NANO 2ND GEN 32G X 4 MM MISC SMARTSIG:1 Each Daily   Continuous Blood Gluc Transmit (DEXCOM G6 TRANSMITTER) MISC USE TO MONITOR BLOOD SUGARS. CHANGE EVERY 90 DAYS.   Continuous Glucose Sensor (DEXCOM G6 SENSOR) MISC SMARTSIG:1 EACH TOPICAL EVERY 10 DAYS   gabapentin  (NEURONTIN ) 100 MG capsule Take 1 capsule (100 mg total) by mouth at bedtime.   Insulin  Glargine (BASAGLAR KWIKPEN) 100 UNIT/ML Start 10 units once a day.  Titrate by 1 unit daily until fasting blood sugar under 125.  Max daily dose 50 units.   JANUVIA 100 MG tablet Take 100 mg by mouth daily.   levonorgestrel  (MIRENA ) 20 MCG/24HR IUD 1 each by Intrauterine  route once.   levothyroxine (SYNTHROID) 75 MCG tablet Take 75 mcg by mouth daily.   lisinopril (ZESTRIL) 5 MG tablet Take by mouth.   metoprolol  succinate (TOPROL -XL) 50 MG 24 hr tablet TAKE 1 TABLET BY MOUTH EVERY DAY (Patient taking differently: Take 50 mg by mouth as needed.)   ondansetron  (ZOFRAN -ODT) 4 MG disintegrating tablet Take 4 mg by mouth every 8 (eight) hours as needed.   pantoprazole  (PROTONIX ) 40 MG tablet TAKE 1 TABLET BY MOUTH EVERY DAY   sertraline  (ZOLOFT ) 100 MG tablet Take 1 tablet (100 mg total) by mouth daily.   SYNJARDY XR 12.10-998 MG TB24 Take 2 tablets by mouth every morning.   benzonatate  (TESSALON ) 100 MG capsule Take 1 capsule (100 mg total) by mouth 2 (two) times daily as needed for cough. (Patient not taking: Reported on 11/09/2023)   busPIRone  (BUSPAR ) 10 MG tablet TAKE 1 TABLET BY MOUTH TWICE A DAY (Patient not taking: Reported on 11/09/2023)   glimepiride (AMARYL) 4 MG tablet Take 2 mg by mouth.   guaiFENesin -dextromethorphan (ROBITUSSIN DM) 100-10 MG/5ML syrup Take 5 mLs by mouth every 4 (four) hours as needed for cough. (Patient not taking: Reported on 11/09/2023)   rOPINIRole  (REQUIP ) 0.5 MG tablet Take 1 tablet (0.5 mg total) by mouth at bedtime. (Patient not taking: Reported on 08/11/2023)   No facility-administered medications prior to visit.    Review of Systems  Last CBC Lab Results  Component Value Date   WBC 15.8 (H) 08/03/2023   HGB 13.7 08/03/2023   HCT 41.6 08/03/2023   MCV 89 08/03/2023   MCH 29.3 08/03/2023   RDW 13.9 08/03/2023   PLT 482 (H) 08/03/2023   Last metabolic panel Lab Results  Component Value Date   GLUCOSE 143 (H) 08/03/2023   NA 139 08/03/2023   K 5.1 08/03/2023   CL 100 08/03/2023   CO2 20 08/03/2023   BUN 20 08/03/2023   CREATININE 0.74 08/03/2023   EGFR 100 08/03/2023   CALCIUM 10.0 08/03/2023   PHOS 2.9 04/11/2014   PROT 7.7 08/03/2023   ALBUMIN 4.9 08/03/2023   LABGLOB 2.8 08/03/2023   AGRATIO 1.6 11/16/2022    BILITOT 0.3 08/03/2023   ALKPHOS 88 08/03/2023   AST 22 08/03/2023   ALT 24 08/03/2023   ANIONGAP 8 03/02/2017   Last lipids No results found for: "CHOL", "HDL", "LDLCALC", "LDLDIRECT", "TRIG", "CHOLHDL" Last hemoglobin A1c Lab Results  Component Value Date   HGBA1C 7.9 02/18/2022   Last thyroid  functions Lab Results  Component Value Date   TSH 4.460 08/03/2023   Last vitamin D  Lab Results  Component Value Date   VD25OH 32.7 10/14/2021   Last vitamin B12 and Folate Lab Results  Component Value Date   VITAMINB12 679 08/03/2023  Objective    BP 108/75   Pulse 80   Ht 5\' 5"  (1.651 m)   Wt 155 lb (70.3 kg)   SpO2 99%   BMI 25.79 kg/m  BP Readings from Last 3 Encounters:  11/09/23 108/75  09/22/23 (!) 92/56  08/11/23 107/70   Wt Readings from Last 3 Encounters:  11/09/23 155 lb (70.3 kg)  09/22/23 152 lb 4.8 oz (69.1 kg)  08/11/23 153 lb (69.4 kg)        Physical Exam  Physical Exam GENERAL: Tired appearing, no acute distress. NECK: No thyroid  nodules palpable. CHEST: Lungs clear to auscultation. CARDIOVASCULAR: Regular rate and rhythm.    No results found for any visits on 11/09/23.  Assessment & Plan     Problem List Items Addressed This Visit       Endocrine   Diabetes mellitus, type 2 (HCC)   Relevant Medications   JANUVIA 100 MG tablet   Insulin  Glargine (BASAGLAR KWIKPEN) 100 UNIT/ML   Acquired hypothyroidism     Other   Night sweats   Chronic headache disorder   Relevant Medications   Atogepant 60 MG TABS   predniSONE  (DELTASONE ) 20 MG tablet   Other Visit Diagnoses       Other fatigue    -  Primary   Relevant Orders   TSH+T4F+T3Free   CMP14+EGFR   Vitamin B12   CBC w/Diff/Platelet   Home sleep test     Sleep difficulties       Relevant Orders   Home sleep test       Assessment and Plan Assessment & Plan Fatigue Persistent fatigue for over a week with poor sleep quality and night sweats, likely due to  perimenopausal symptoms. No acute illness symptoms such as fever, cough, or sore throat. Sleep disturbances are considered a contributing factor. - Order CBC, CMP, thyroid  panel, and vitamin B12 level to evaluate potential causes of fatigue. - Continue gabapentin  at bedtime for sleep and perimenopausal symptoms. - Consider increasing melatonin dosage to 10 mg if needed for sleep. - Consider non-hormonal options for managing perimenopausal symptoms if needed.  Chronic Headache Persistent headache for several days, unrelieved by acetaminophen  or ibuprofen . Awaiting insurance approval for Qulipta for migraine prevention. Prednisone  is considered to interrupt the headache cycle. Monitor for hyperglycemia due to prednisone . - Prescribe prednisone  40 mg once daily for 10 days to interrupt the headache cycle. - Monitor blood sugar levels closely due to potential hyperglycemia from prednisone . - Follow up on insurance approval for Qulipta for migraine prevention.  Diabetes Mellitus Under endocrinology care with insulin  therapy. Previous tests showed elevated glucose levels and trace glucose in urine, expected with medication. - Continue current diabetes management plan with endocrinology. - Monitor blood sugar levels, especially while on prednisone .  Urinary Tract Infection (UTI) Previously treated with antibiotics, symptoms resolved. Follow-up urinalysis showed no red blood cells, indicating resolution. Trace glucose likely related to diabetes medication, with signs of mild dehydration. - Ensure adequate hydration to prevent future UTIs.  General Health Maintenance Taking vitamin D  supplements due to previously low levels. Considering a home sleep study for sleep quality concerns and potential sleep apnea. - Continue vitamin D  supplementation at 2000 IU daily. - Arrange for a home sleep study to evaluate for potential sleep apnea.     No follow-ups on file.         Mimi Alt, MD  [facility] [phone] (phone) [phone] (fax)  [facility]

## 2023-11-10 LAB — CMP14+EGFR
ALT: 24 IU/L (ref 0–32)
AST: 23 IU/L (ref 0–40)
Albumin: 4.7 g/dL (ref 3.9–4.9)
Alkaline Phosphatase: 80 IU/L (ref 44–121)
BUN/Creatinine Ratio: 22 (ref 9–23)
BUN: 17 mg/dL (ref 6–24)
Bilirubin Total: <0.2 mg/dL (ref 0.0–1.2)
CO2: 23 mmol/L (ref 20–29)
Calcium: 9.7 mg/dL (ref 8.7–10.2)
Chloride: 102 mmol/L (ref 96–106)
Creatinine, Ser: 0.79 mg/dL (ref 0.57–1.00)
Globulin, Total: 2.6 g/dL (ref 1.5–4.5)
Glucose: 99 mg/dL (ref 70–99)
Potassium: 4.4 mmol/L (ref 3.5–5.2)
Sodium: 140 mmol/L (ref 134–144)
Total Protein: 7.3 g/dL (ref 6.0–8.5)
eGFR: 92 mL/min/{1.73_m2} (ref >59)

## 2023-11-10 LAB — TSH+T4F+T3FREE
Free T4: 1.05 ng/dL (ref 0.82–1.77)
T3, Free: 2.6 pg/mL (ref 2.0–4.4)
TSH: 3.24 u[IU]/mL (ref 0.450–4.500)

## 2023-11-10 LAB — CBC WITH DIFFERENTIAL/PLATELET
Basophils Absolute: 0.1 10*3/uL (ref 0.0–0.2)
Basos: 1 %
EOS (ABSOLUTE): 0.3 10*3/uL (ref 0.0–0.4)
Eos: 2 %
Hematocrit: 39.2 % (ref 34.0–46.6)
Hemoglobin: 13.2 g/dL (ref 11.1–15.9)
Immature Grans (Abs): 0.1 10*3/uL (ref 0.0–0.1)
Immature Granulocytes: 1 %
Lymphocytes Absolute: 5.8 10*3/uL — ABNORMAL HIGH (ref 0.7–3.1)
Lymphs: 44 %
MCH: 29.7 pg (ref 26.6–33.0)
MCHC: 33.7 g/dL (ref 31.5–35.7)
MCV: 88 fL (ref 79–97)
Monocytes Absolute: 1.2 10*3/uL — ABNORMAL HIGH (ref 0.1–0.9)
Monocytes: 10 %
Neutrophils Absolute: 5.5 10*3/uL (ref 1.4–7.0)
Neutrophils: 42 %
Platelets: 443 10*3/uL (ref 150–450)
RBC: 4.45 x10E6/uL (ref 3.77–5.28)
RDW: 13.9 % (ref 11.7–15.4)
WBC: 13 10*3/uL — ABNORMAL HIGH (ref 3.4–10.8)

## 2023-11-10 LAB — VITAMIN B12: Vitamin B-12: 426 pg/mL (ref 232–1245)

## 2023-11-11 ENCOUNTER — Ambulatory Visit: Payer: Self-pay | Admitting: Family Medicine

## 2023-11-11 DIAGNOSIS — D72821 Monocytosis (symptomatic): Secondary | ICD-10-CM

## 2023-11-11 DIAGNOSIS — R5383 Other fatigue: Secondary | ICD-10-CM

## 2023-11-11 DIAGNOSIS — D72820 Lymphocytosis (symptomatic): Secondary | ICD-10-CM

## 2023-11-13 ENCOUNTER — Other Ambulatory Visit: Payer: Self-pay | Admitting: Family Medicine

## 2023-11-13 DIAGNOSIS — G2581 Restless legs syndrome: Secondary | ICD-10-CM

## 2023-11-22 ENCOUNTER — Inpatient Hospital Stay: Payer: PRIVATE HEALTH INSURANCE

## 2023-11-22 ENCOUNTER — Encounter: Payer: Self-pay | Admitting: Oncology

## 2023-11-22 ENCOUNTER — Inpatient Hospital Stay: Payer: PRIVATE HEALTH INSURANCE | Attending: Oncology | Admitting: Oncology

## 2023-11-22 VITALS — BP 96/76 | HR 76 | Temp 97.4°F | Resp 16 | Ht 65.0 in | Wt 156.1 lb

## 2023-11-22 DIAGNOSIS — D72829 Elevated white blood cell count, unspecified: Secondary | ICD-10-CM | POA: Insufficient documentation

## 2023-11-22 DIAGNOSIS — E039 Hypothyroidism, unspecified: Secondary | ICD-10-CM | POA: Insufficient documentation

## 2023-11-22 DIAGNOSIS — E119 Type 2 diabetes mellitus without complications: Secondary | ICD-10-CM | POA: Insufficient documentation

## 2023-11-22 DIAGNOSIS — Z8572 Personal history of non-Hodgkin lymphomas: Secondary | ICD-10-CM | POA: Insufficient documentation

## 2023-11-22 DIAGNOSIS — Z9081 Acquired absence of spleen: Secondary | ICD-10-CM | POA: Insufficient documentation

## 2023-11-22 DIAGNOSIS — Z87891 Personal history of nicotine dependence: Secondary | ICD-10-CM | POA: Diagnosis not present

## 2023-11-22 LAB — CBC WITH DIFFERENTIAL/PLATELET
Abs Immature Granulocytes: 0.19 10*3/uL — ABNORMAL HIGH (ref 0.00–0.07)
Basophils Absolute: 0.1 10*3/uL (ref 0.0–0.1)
Basophils Relative: 1 %
Eosinophils Absolute: 0.2 10*3/uL (ref 0.0–0.5)
Eosinophils Relative: 1 %
HCT: 41.2 % (ref 36.0–46.0)
Hemoglobin: 13.2 g/dL (ref 12.0–15.0)
Immature Granulocytes: 2 %
Lymphocytes Relative: 43 %
Lymphs Abs: 5.6 10*3/uL — ABNORMAL HIGH (ref 0.7–4.0)
MCH: 28.4 pg (ref 26.0–34.0)
MCHC: 32 g/dL (ref 30.0–36.0)
MCV: 88.6 fL (ref 80.0–100.0)
Monocytes Absolute: 1.2 10*3/uL — ABNORMAL HIGH (ref 0.1–1.0)
Monocytes Relative: 9 %
Neutro Abs: 5.8 10*3/uL (ref 1.7–7.7)
Neutrophils Relative %: 44 %
Platelets: 456 10*3/uL — ABNORMAL HIGH (ref 150–400)
RBC: 4.65 MIL/uL (ref 3.87–5.11)
RDW: 15.4 % (ref 11.5–15.5)
Smear Review: NORMAL
WBC: 13.1 10*3/uL — ABNORMAL HIGH (ref 4.0–10.5)
nRBC: 0.2 % (ref 0.0–0.2)

## 2023-11-22 NOTE — Progress Notes (Signed)
 [facility]  Telephone:([phone]) [phone] [phone]) [phone]  ID: Branda Cain OB: July 18, 1975  MR#: [id number]  AOZ#:[id number]  Patient Care Team: Mimi Alt, MD as PCP - General (Family Medicine) Berton Brock, MD as Consulting Physician (Internal Medicine) Shellie Dials, MD as Consulting Physician (Oncology)  CHIEF COMPLAINT: Leukocytosis.  INTERVAL HISTORY: Patient is a 48 year old female who has a distant history of Hodgkin lymphoma and splenectomy at the age of 73.  She is noted to have a chronically elevated total white blood cell count and is referred for further evaluation.  She currently feels well and is asymptomatic.  She has no neurologic complaints.  She denies any recent fevers or illnesses.  She has a good appetite and denies weight loss.  She has no chest pain, shortness of breath, cough, or hemoptysis.  She denies any nausea, vomiting, constipation, or diarrhea.  She has no urinary complaints.  Patient offers no specific complaints today.  REVIEW OF SYSTEMS:   Review of Systems  Constitutional: Negative.  Negative for fever, malaise/fatigue and weight loss.  Respiratory: Negative.  Negative for cough, hemoptysis and shortness of breath.   Cardiovascular: Negative.  Negative for chest pain and leg swelling.  Gastrointestinal: Negative.  Negative for abdominal pain.  Genitourinary: Negative.  Negative for dysuria.  Musculoskeletal: Negative.  Negative for back pain.  Skin: Negative.  Negative for rash.  Neurological: Negative.  Negative for dizziness, focal weakness, weakness and headaches.  Psychiatric/Behavioral: Negative.  The patient is not nervous/anxious.     As per HPI. Otherwise, a complete review of systems is negative.  PAST MEDICAL HISTORY: Past Medical History:  Diagnosis Date   Bacterial vaginitis    Cancer (HCC) 1987   Hodgkins Lymphoma   CHF (congestive heart failure) (HCC)    Colon polyps    Complication  of anesthesia    nausea/vomiting   Depression    Diabetes mellitus without complication (HCC)    History of abnormal mammogram 2013   @[facility] - NEG MRI AND BX OCT 2014 NORMAL   History of mammogram 03/2015   BREAST MRI AT [facility] WNL   History of Papanicolaou smear of cervix 10/08/2013; 08/04/15   -/-; ASCUS/HPV NEG;   Hodgkin's disease (HCC) 1987   She had surgical resection of Lymph nodes and chemo + rad tx's.   Hypertension    Hypothyroidism    2ND TO CA TX   PONV (postoperative nausea and vomiting)    Restrictive lung disease    Skin cancer may 2025   place on back   Vitamin D  deficiency     PAST SURGICAL HISTORY: Past Surgical History:  Procedure Laterality Date   APPENDECTOMY     CHOLECYSTECTOMY     DEEP NECK LYMPH NODE BIOPSY / EXCISION  1987   hodgkin lymphoma   DILATATION & CURETTAGE/HYSTEROSCOPY WITH MYOSURE N/A 03/10/2017   Procedure: DILATATION & CURETTAGE/HYSTEROSCOPY WITH MYOSURE;  Surgeon: Kris Pester, MD;  Location: [facility] ORS;  Service: Gynecology;  Laterality: N/A;   INTRAUTERINE DEVICE (IUD) INSERTION  [id number]; 02/06/09; 12/12/13   INTRAUTERINE DEVICE (IUD) INSERTION N/A 03/10/2017   Procedure: INTRAUTERINE DEVICE (IUD) INSERTION;  Surgeon: Kris Pester, MD;  Location: [facility] ORS;  Service: Gynecology;  Laterality: N/A;   SPLENECTOMY     THYROIDECTOMY      FAMILY HISTORY: Family History  Problem Relation Age of Onset   Hypertension Mother    Other Maternal Grandfather        bile duct cancer  Colon cancer Paternal Grandmother        9-70    ADVANCED DIRECTIVES (Y/N):  N  HEALTH MAINTENANCE: Social History   Tobacco Use   Smoking status: Former    Current packs/day: 0.00    Average packs/day: 0.3 packs/day for 11.7 years (3.0 ttl pk-yrs)    Types: Cigarettes    Start date: 03/10/2006    Quit date: 03/10/2016    Years since quitting: 7.7   Smokeless tobacco: Never  Vaping Use   Vaping status: Never Used  Substance Use Topics   Alcohol  use: Yes    Alcohol/week: 2.0 standard drinks of alcohol    Types: 2 Glasses of wine per week    Comment: occasional   Drug use: No     Colonoscopy:  PAP:  Bone density:  Lipid panel:  Allergies  Allergen Reactions   Dapagliflozin Other (See Comments)    (Farxiga) UTI   Liraglutide Nausea Only and Nausea And Vomiting    Victoza Victoza    Current Outpatient Medications  Medication Sig Dispense Refill   ALPRAZolam  (XANAX ) 0.5 MG tablet TAKE 1 TAB AT BEDTIME AS NEEDED FOR ANXIETY AND 1/2 TAB DURING THE DAY FOR PANIC ATTACKS. DO NOT TAKE WITH MUSCLE RELAXANTS 30 tablet 0   atorvastatin (LIPITOR) 40 MG tablet Take 1 tablet by mouth daily.     BD PEN NEEDLE NANO 2ND GEN 32G X 4 MM MISC SMARTSIG:1 Each Daily     Continuous Blood Gluc Transmit (DEXCOM G6 TRANSMITTER) MISC USE TO MONITOR BLOOD SUGARS. CHANGE EVERY 90 DAYS.     Continuous Glucose Sensor (DEXCOM G6 SENSOR) MISC SMARTSIG:1 EACH TOPICAL EVERY 10 DAYS 9 each 0   gabapentin  (NEURONTIN ) 100 MG capsule TAKE 1 CAPSULE BY MOUTH AT BEDTIME. 90 capsule 2   glimepiride (AMARYL) 4 MG tablet Take 2 mg by mouth.     Insulin  Glargine (BASAGLAR KWIKPEN) 100 UNIT/ML Start 10 units once a day.  Titrate by 1 unit daily until fasting blood sugar under 125.  Max daily dose 50 units.     JANUVIA 100 MG tablet Take 100 mg by mouth daily.     levonorgestrel  (MIRENA ) 20 MCG/24HR IUD 1 each by Intrauterine route once.     levothyroxine (SYNTHROID) 75 MCG tablet Take 75 mcg by mouth daily.     lisinopril (ZESTRIL) 5 MG tablet Take by mouth.     metoprolol  succinate (TOPROL -XL) 50 MG 24 hr tablet TAKE 1 TABLET BY MOUTH EVERY DAY 90 tablet 1   ondansetron  (ZOFRAN -ODT) 4 MG disintegrating tablet Take 4 mg by mouth every 8 (eight) hours as needed.     pantoprazole  (PROTONIX ) 40 MG tablet TAKE 1 TABLET BY MOUTH EVERY DAY 90 tablet 0   sertraline  (ZOLOFT ) 100 MG tablet Take 1 tablet (100 mg total) by mouth daily. 90 tablet 3   SYNJARDY XR 12.10-998 MG  TB24 Take 2 tablets by mouth every morning.     benzonatate  (TESSALON ) 100 MG capsule Take 1 capsule (100 mg total) by mouth 2 (two) times daily as needed for cough. (Patient not taking: Reported on 11/22/2023) 20 capsule 0   busPIRone  (BUSPAR ) 10 MG tablet TAKE 1 TABLET BY MOUTH TWICE A DAY (Patient not taking: Reported on 11/22/2023) 180 tablet 1   guaiFENesin -dextromethorphan (ROBITUSSIN DM) 100-10 MG/5ML syrup Take 5 mLs by mouth every 4 (four) hours as needed for cough. (Patient not taking: Reported on 11/09/2023) 118 mL 0   rOPINIRole  (REQUIP ) 0.5 MG tablet Take 1  tablet (0.5 mg total) by mouth at bedtime. (Patient not taking: Reported on 11/22/2023) 30 tablet 3   No current facility-administered medications for this visit.    OBJECTIVE: Vitals:   11/22/23 1111  BP: 96/76  Pulse: 76  Resp: 16  Temp: (!) 97.4 F (36.3 C)  SpO2: 99%     Body mass index is 25.98 kg/m.    ECOG FS:0 - Asymptomatic  General: Well-developed, well-nourished, no acute distress. Eyes: Pink conjunctiva, anicteric sclera. HEENT: Normocephalic, moist mucous membranes. Lungs: No audible wheezing or coughing. Heart: Regular rate and rhythm. Abdomen: Soft, nontender, no obvious distention. Musculoskeletal: No edema, cyanosis, or clubbing. Neuro: Alert, answering all questions appropriately. Cranial nerves grossly intact. Skin: No rashes or petechiae noted. Psych: Normal affect. Lymphatics: No cervical, calvicular, axillary or inguinal LAD.   LAB RESULTS:  Lab Results  Component Value Date   NA 140 11/09/2023   K 4.4 11/09/2023   CL 102 11/09/2023   CO2 23 11/09/2023   GLUCOSE 99 11/09/2023   BUN 17 11/09/2023   CREATININE 0.79 11/09/2023   CALCIUM 9.7 11/09/2023   PROT 7.3 11/09/2023   ALBUMIN 4.7 11/09/2023   AST 23 11/09/2023   ALT 24 11/09/2023   ALKPHOS 80 11/09/2023   BILITOT <0.2 11/09/2023   GFRNONAA >60 03/02/2017   GFRAA >60 03/02/2017    Lab Results  Component Value Date   WBC  13.1 (H) 11/22/2023   NEUTROABS PENDING 11/22/2023   HGB 13.2 11/22/2023   HCT 41.2 11/22/2023   MCV 88.6 11/22/2023   PLT 456 (H) 11/22/2023     STUDIES: MR BREAST BILATERAL W WO CONTRAST INC CAD Result Date: 11/04/2023 CLINICAL DATA:  Left breast biopsy 5 years ago positive for Hodgkin's lymphoma. No surgery. Patient asymptomatic. High risk screening. EXAM: BILATERAL BREAST MRI WITH AND WITHOUT CONTRAST TECHNIQUE: Multiplanar, multisequence MR images of both breasts were obtained prior to and following the intravenous administration of 7 ml of Vueway . Three-dimensional MR images were rendered by post-processing of the original MR data on an independent workstation. The three-dimensional MR images were interpreted, and findings are reported in the following complete MRI report for this study. Three dimensional images were evaluated at the independent interpreting workstation using the DynaCAD thin client. COMPARISON:  Previous exam(s). FINDINGS: Breast composition: b. Scattered fibroglandular tissue. Background parenchymal enhancement: Minimal. Right breast: No mass or abnormal enhancement. Left breast: Biopsy clip artifact over the upper central breast. No mass or abnormal enhancement. Lymph nodes: No abnormal appearing lymph nodes. Ancillary findings:  None. IMPRESSION: Normal breast MRI without evidence of malignancy. RECOMMENDATION: Recommend continued annual bilateral 3D screening mammography. Consider continued annual high risk screening breast MRI. The American Cancer Society recommends annual MRI and mammography in patients with an estimated lifetime risk of developing breast cancer greater than 20 - 25%, or who are known or suspected to be positive for the breast cancer gene. BI-RADS CATEGORY  1: Negative. Electronically Signed   By: Roda Cirri M.D.   On: 11/04/2023 14:03    ASSESSMENT: Leukocytosis.  PLAN:    Leukocytosis: Chronic and unchanged.  Upon review of patient's chart, she  has had an elevated total white blood cell count ranging between 10.4 and 36.5 since at least April 2013.  Her current result is approximately her baseline of 13.1.  Patient has a distant history of Hodgkin's lymphoma and splenectomy, but these are likely noncontributory.  Peripheral blood flow cytometry and BCR-ABL mutation are pending at time of dictation.  Return  to clinic in 3 weeks with video-assisted telemedicine visit.    I spent a total of 45 minutes reviewing chart data, face-to-face evaluation with the patient, counseling and coordination of care as detailed above.  Patient expressed understanding and was in agreement with this plan. She also understands that She can call clinic at any time with any questions, concerns, or complaints.    Shellie Dials, MD   11/22/2023 12:40 PM

## 2023-11-23 ENCOUNTER — Ambulatory Visit (HOSPITAL_BASED_OUTPATIENT_CLINIC_OR_DEPARTMENT_OTHER): Payer: PRIVATE HEALTH INSURANCE | Admitting: Orthopaedic Surgery

## 2023-11-24 LAB — COMP PANEL: LEUKEMIA/LYMPHOMA

## 2023-11-25 LAB — BCR-ABL1, CML/ALL, PCR, QUANT
E1A2 Transcript: <0.0032 %
Interpretation (BCRAL):: NEGATIVE
b2a2 transcript: <0.0032 %
b3a2 transcript: <0.0032 %

## 2023-12-08 ENCOUNTER — Telehealth: Payer: PRIVATE HEALTH INSURANCE | Admitting: Oncology

## 2023-12-14 ENCOUNTER — Telehealth: Payer: Self-pay | Admitting: Oncology

## 2023-12-14 ENCOUNTER — Inpatient Hospital Stay: Payer: PRIVATE HEALTH INSURANCE | Attending: Oncology | Admitting: Oncology

## 2023-12-19 NOTE — Telephone Encounter (Signed)
 Pt called to notify she wouldn't be able to make appt today due to family emergency - asked for appt to be pushed back by 1 week - [facility]

## 2023-12-21 ENCOUNTER — Inpatient Hospital Stay: Payer: PRIVATE HEALTH INSURANCE | Attending: Oncology | Admitting: Oncology

## 2023-12-21 DIAGNOSIS — D72829 Elevated white blood cell count, unspecified: Secondary | ICD-10-CM

## 2023-12-21 NOTE — Progress Notes (Signed)
 [facility]  Telephone:([phone]) [phone] Fax:(336) [phone]  ID: Lori [name] OB: 01-Mar-1976  MR#: [id number]  RDW#:[id number]  Patient Care Team: Sharma Coyer, MD as PCP - General (Family Medicine) Cherilyn Debby CROME, MD as Consulting Physician (Internal Medicine) Jacobo Evalene PARAS, MD as Consulting Physician (Oncology)  I connected with Lori [name] on 12/21/23 at  2:45 PM EDT by video enabled telemedicine visit and verified that I am speaking with the correct person using two identifiers.   I discussed the limitations, risks, security and privacy concerns of performing an evaluation and management service by telemedicine and the availability of in-person appointments. I also discussed with the patient that there may be a patient responsible charge related to this service. The patient expressed understanding and agreed to proceed.   Other persons participating in the visit and their role in the encounter: Patient, MD.  Patient's location: Home. Provider's location: Clinic.  CHIEF COMPLAINT: Leukocytosis.  INTERVAL HISTORY: Patient agreed to video-assisted telemedicine visit for further evaluation and discussion of her laboratory results.  She continues to feel well and remains asymptomatic.  She has no neurologic complaints.  She denies any recent fevers or illnesses.  She has a good appetite and denies weight loss.  She has no chest pain, shortness of breath, cough, or hemoptysis.  She denies any nausea, vomiting, constipation, or diarrhea.  She has no urinary complaints.  Patient offers no specific complaints today.  REVIEW OF SYSTEMS:   Review of Systems  Constitutional: Negative.  Negative for fever, malaise/fatigue and weight loss.  Respiratory: Negative.  Negative for cough, hemoptysis and shortness of breath.   Cardiovascular: Negative.  Negative for chest pain and leg swelling.  Gastrointestinal: Negative.  Negative for abdominal pain.   Genitourinary: Negative.  Negative for dysuria.  Musculoskeletal: Negative.  Negative for back pain.  Skin: Negative.  Negative for rash.  Neurological: Negative.  Negative for dizziness, focal weakness, weakness and headaches.  Psychiatric/Behavioral: Negative.  The patient is not nervous/anxious.     As per HPI. Otherwise, a complete review of systems is negative.  PAST MEDICAL HISTORY: Past Medical History:  Diagnosis Date   Bacterial vaginitis    Cancer (HCC) 1987   Hodgkins Lymphoma   CHF (congestive heart failure) (HCC)    Colon polyps    Complication of anesthesia    nausea/vomiting   Depression    Diabetes mellitus without complication (HCC)    History of abnormal mammogram 2013   @[facility] - NEG MRI AND BX OCT 2014 NORMAL   History of mammogram 03/2015   BREAST MRI AT [facility] WNL   History of Papanicolaou smear of cervix 10/08/2013; 08/04/15   -/-; ASCUS/HPV NEG;   Hodgkin's disease (HCC) 1987   She had surgical resection of Lymph nodes and chemo + rad tx's.   Hypertension    Hypothyroidism    2ND TO CA TX   PONV (postoperative nausea and vomiting)    Restrictive lung disease    Skin cancer may 2025   place on back   Vitamin D  deficiency     PAST SURGICAL HISTORY: Past Surgical History:  Procedure Laterality Date   APPENDECTOMY     CHOLECYSTECTOMY     DEEP NECK LYMPH NODE BIOPSY / EXCISION  1987   hodgkin lymphoma   DILATATION & CURETTAGE/HYSTEROSCOPY WITH MYOSURE N/A 03/10/2017   Procedure: DILATATION & CURETTAGE/HYSTEROSCOPY WITH MYOSURE;  Surgeon: Leonce Garnette BIRCH, MD;  Location: [facility] OR[facility];  Service: Gynecology;  Laterality: N/A;   INTRAUTERINE  DEVICE (IUD) INSERTION  [id number]; 02/06/09; 12/12/13   INTRAUTERINE DEVICE (IUD) INSERTION N/A 03/10/2017   Procedure: INTRAUTERINE DEVICE (IUD) INSERTION;  Surgeon: Leonce Garnette BIRCH, MD;  Location: [facility] OR[facility];  Service: Gynecology;  Laterality: N/A;   SPLENECTOMY     THYROIDECTOMY      FAMILY HISTORY: Family History   Problem Relation Age of Onset   Hypertension Mother    Other Maternal Grandfather        bile duct cancer   Colon cancer Paternal Grandmother        61-70    ADVANCED DIRECTIVES (Y/N):  N  HEALTH MAINTENANCE: Social History   Tobacco Use   Smoking status: Former    Current packs/day: 0.00    Average packs/day: 0.3 packs/day for 11.7 years (3.0 ttl pk-yrs)    Types: Cigarettes    Start date: 03/10/2006    Quit date: 03/10/2016    Years since quitting: 7.7   Smokeless tobacco: Never  Vaping Use   Vaping status: Never Used  Substance Use Topics   Alcohol use: Yes    Alcohol/week: 2.0 standard drinks of alcohol    Types: 2 Glasses of wine per week    Comment: occasional   Drug use: No     Colonoscopy:  PAP:  Bone density:  Lipid panel:  Allergies  Allergen Reactions   Dapagliflozin Other (See Comments)    (Farxiga) UTI   Liraglutide Nausea Only and Nausea And Vomiting    Victoza Victoza    Current Outpatient Medications  Medication Sig Dispense Refill   ALPRAZolam  (XANAX ) 0.5 MG tablet TAKE 1 TAB AT BEDTIME AS NEEDED FOR ANXIETY AND 1/2 TAB DURING THE DAY FOR PANIC ATTACKS. DO NOT TAKE WITH MUSCLE RELAXANTS 30 tablet 0   atorvastatin (LIPITOR) 40 MG tablet Take 1 tablet by mouth daily.     BD PEN NEEDLE NANO 2ND GEN 32G X 4 MM MISC SMARTSIG:1 Each Daily     benzonatate  (TESSALON ) 100 MG capsule Take 1 capsule (100 mg total) by mouth 2 (two) times daily as needed for cough. (Patient not taking: Reported on 11/22/2023) 20 capsule 0   busPIRone  (BUSPAR ) 10 MG tablet TAKE 1 TABLET BY MOUTH TWICE A DAY (Patient not taking: Reported on 11/22/2023) 180 tablet 1   Continuous Blood Gluc Transmit (DEXCOM G6 TRANSMITTER) MISC USE TO MONITOR BLOOD SUGARS. CHANGE EVERY 90 DAYS.     Continuous Glucose Sensor (DEXCOM G6 SENSOR) MISC SMARTSIG:1 EACH TOPICAL EVERY 10 DAYS 9 each 0   gabapentin  (NEURONTIN ) 100 MG capsule TAKE 1 CAPSULE BY MOUTH AT BEDTIME. 90 capsule 2   glimepiride  (AMARYL) 4 MG tablet Take 2 mg by mouth.     guaiFENesin -dextromethorphan (ROBITUSSIN DM) 100-10 MG/5ML syrup Take 5 mLs by mouth every 4 (four) hours as needed for cough. (Patient not taking: Reported on 11/09/2023) 118 mL 0   Insulin  Glargine (BASAGLAR KWIKPEN) 100 UNIT/ML Start 10 units once a day.  Titrate by 1 unit daily until fasting blood sugar under 125.  Max daily dose 50 units.     JANUVIA 100 MG tablet Take 100 mg by mouth daily.     levonorgestrel  (MIRENA ) 20 MCG/24HR IUD 1 each by Intrauterine route once.     levothyroxine (SYNTHROID) 75 MCG tablet Take 75 mcg by mouth daily.     lisinopril (ZESTRIL) 5 MG tablet Take by mouth.     metoprolol  succinate (TOPROL -XL) 50 MG 24 hr tablet TAKE 1 TABLET BY MOUTH EVERY DAY 90 tablet  1   ondansetron  (ZOFRAN -ODT) 4 MG disintegrating tablet Take 4 mg by mouth every 8 (eight) hours as needed.     pantoprazole  (PROTONIX ) 40 MG tablet TAKE 1 TABLET BY MOUTH EVERY DAY 90 tablet 0   rOPINIRole  (REQUIP ) 0.5 MG tablet Take 1 tablet (0.5 mg total) by mouth at bedtime. (Patient not taking: Reported on 11/22/2023) 30 tablet 3   sertraline  (ZOLOFT ) 100 MG tablet Take 1 tablet (100 mg total) by mouth daily. 90 tablet 3   SYNJARDY XR 12.10-998 MG TB24 Take 2 tablets by mouth every morning.     No current facility-administered medications for this visit.    OBJECTIVE: There were no vitals filed for this visit.    There is no height or weight on file to calculate BMI.    ECOG FS:0 - Asymptomatic  General: Well-developed, well-nourished, no acute distress. HEENT: Normocephalic. Neuro: Alert, answering all questions appropriately. Cranial nerves grossly intact. Psych: Normal affect.  LAB RESULTS:  Lab Results  Component Value Date   NA 140 11/09/2023   K 4.4 11/09/2023   CL 102 11/09/2023   CO2 23 11/09/2023   GLUCOSE 99 11/09/2023   BUN 17 11/09/2023   CREATININE 0.79 11/09/2023   CALCIUM 9.7 11/09/2023   PROT 7.3 11/09/2023   ALBUMIN 4.7  11/09/2023   AST 23 11/09/2023   ALT 24 11/09/2023   ALKPHOS 80 11/09/2023   BILITOT <0.2 11/09/2023   GFRNONAA >60 03/02/2017   GFRAA >60 03/02/2017    Lab Results  Component Value Date   WBC 13.1 (H) 11/22/2023   NEUTROABS 5.8 11/22/2023   HGB 13.2 11/22/2023   HCT 41.2 11/22/2023   MCV 88.6 11/22/2023   PLT 456 (H) 11/22/2023     STUDIES: No results found.   ASSESSMENT: Leukocytosis.  PLAN:    Leukocytosis: Chronic and unchanged.  Upon review of patient's chart, she has had an elevated total white blood cell count ranging between 10.4 and 36.5 since at least April 2013.  Her most recent result is approximately her baseline of 13.1.  Patient has a distant history of Hodgkin's lymphoma and splenectomy, but these are likely noncontributory.  Peripheral blood flow cytometry and BCR-ABL mutation are both negative.  No intervention is needed at this time.  Patient does not require bone marrow biopsy.  After discussion with the patient, is agreed upon that no further follow-up is necessary.  Please refer patient back if there are any questions or concerns.   Thrombocytosis: Chronic and unchanged.  Possibly related to history of splenectomy.  I provided 20 minutes of face-to-face video visit time during this encounter which included chart review, counseling, and coordination of care as documented above.   Patient expressed understanding and was in agreement with this plan. She also understands that She can call clinic at any time with any questions, concerns, or complaints.    Evalene JINNY Reusing, MD   12/21/2023 2:44 PM

## 2024-01-18 LAB — HEMOGLOBIN A1C: Hemoglobin A1C: 7.3

## 2024-01-20 ENCOUNTER — Ambulatory Visit (HOSPITAL_BASED_OUTPATIENT_CLINIC_OR_DEPARTMENT_OTHER): Payer: PRIVATE HEALTH INSURANCE | Admitting: Orthopaedic Surgery

## 2024-01-20 ENCOUNTER — Other Ambulatory Visit (HOSPITAL_BASED_OUTPATIENT_CLINIC_OR_DEPARTMENT_OTHER): Payer: Self-pay

## 2024-01-20 ENCOUNTER — Ambulatory Visit (HOSPITAL_BASED_OUTPATIENT_CLINIC_OR_DEPARTMENT_OTHER): Payer: Self-pay | Admitting: Orthopaedic Surgery

## 2024-01-20 DIAGNOSIS — S73191A Other sprain of right hip, initial encounter: Secondary | ICD-10-CM

## 2024-01-20 MED ORDER — ACETAMINOPHEN 500 MG PO TABS
500.0000 mg | ORAL_TABLET | Freq: Three times a day (TID) | ORAL | 0 refills | Status: AC
  Filled 2024-01-20: qty 30, 10d supply, fill #0

## 2024-01-20 MED ORDER — ASPIRIN 325 MG PO TBEC
325.0000 mg | DELAYED_RELEASE_TABLET | Freq: Every day | ORAL | 0 refills | Status: DC
  Filled 2024-01-20: qty 14, 14d supply, fill #0

## 2024-01-20 MED ORDER — IBUPROFEN 800 MG PO TABS
800.0000 mg | ORAL_TABLET | Freq: Three times a day (TID) | ORAL | 0 refills | Status: AC
  Filled 2024-01-20: qty 28, 9d supply, fill #0
  Filled 2024-01-20: qty 2, 1d supply, fill #0

## 2024-01-20 MED ORDER — OXYCODONE HCL 5 MG PO TABS
5.0000 mg | ORAL_TABLET | ORAL | 0 refills | Status: DC | PRN
  Filled 2024-01-20: qty 10, 2d supply, fill #0

## 2024-01-20 NOTE — Addendum Note (Signed)
 Addended by: Shilo Pauwels L on: 01/20/2024 01:34 PM   Modules accepted: Orders

## 2024-01-20 NOTE — Progress Notes (Signed)
 Chief Complaint: Right hip pain     History of Present Illness:   01/20/2024: Today today for follow-up of her right hip.  Today she is experiencing pain more in a C-shaped distribution in the groin area.  Lori [name] is a 48 y.o. female presents today with ongoing right hip pain which has been bothersome to her since 2019.  At this point she states that the hip pain has been progressing and she is experiencing this predominantly about the lateral aspect of the hip.  This is now much more bothersome and is bothering her even while she is sitting down.  She does have small grandchildren which she enjoys being active with although she feels like her hip is limiting her quite significantly.  She has previously had a femoral acetabular injection but got no relief from this.  She did trial physical therapy as well which she worked on range of motion but this did not improve her hip.  She is here today after seeing my partner Dr. Jerri for further discussion    Surgical History:   None  PMH/PSH/Family History/Social History/Meds/Allergies:    Past Medical History:  Diagnosis Date   Bacterial vaginitis    Cancer (HCC) 1987   Hodgkins Lymphoma   CHF (congestive heart failure) (HCC)    Colon polyps    Complication of anesthesia    nausea/vomiting   Depression    Diabetes mellitus without complication (HCC)    History of abnormal mammogram 2013   @[facility] - NEG MRI AND BX OCT 2014 NORMAL   History of mammogram 03/2015   BREAST MRI AT [facility] WNL   History of Papanicolaou smear of cervix 10/08/2013; 08/04/15   -/-; ASCUS/HPV NEG;   Hodgkin's disease (HCC) 1987   She had surgical resection of Lymph nodes and chemo + rad tx's.   Hypertension    Hypothyroidism    2ND TO [place] TX   PONV (postoperative nausea and vomiting)    Restrictive lung disease    Skin cancer may 2025   place on back   Vitamin D  deficiency    Past Surgical History:  Procedure Laterality  Date   APPENDECTOMY     CHOLECYSTECTOMY     DEEP NECK LYMPH NODE BIOPSY / EXCISION  1987   hodgkin lymphoma   DILATATION & CURETTAGE/HYSTEROSCOPY WITH MYOSURE N/A 03/10/2017   Procedure: DILATATION & CURETTAGE/HYSTEROSCOPY WITH MYOSURE;  Surgeon: Leonce Garnette BIRCH, MD;  Location: [facility] OR[facility];  Service: Gynecology;  Laterality: N/A;   INTRAUTERINE DEVICE (IUD) INSERTION  [id number]; 02/06/09; 12/12/13   INTRAUTERINE DEVICE (IUD) INSERTION N/A 03/10/2017   Procedure: INTRAUTERINE DEVICE (IUD) INSERTION;  Surgeon: Leonce Garnette BIRCH, MD;  Location: [facility] OR[facility];  Service: Gynecology;  Laterality: N/A;   SPLENECTOMY     THYROIDECTOMY     Social History   Socioeconomic History   Marital status: Married    Spouse name: Not on file   Number of children: Not on file   Years of education: 12   Highest education level: GED or equivalent  Occupational History   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    Comment: Mirena   Other Topics Concern   Not on file  Social History Narrative   Not on file   Social Drivers of Health   Financial Resource Strain: Low Risk  (11/22/2023)   Overall Financial Resource Strain (CARDIA)    Difficulty of Paying Living Expenses: Not hard at all  Food Insecurity: No Food Insecurity (11/22/2023)   Hunger Vital Sign    Worried About Running Out of Food in the Last Year: Never true    Ran Out of Food in the Last Year:  Never true  Transportation Needs: No Transportation Needs (11/22/2023)   PRAPARE - Administrator, Civil Service (Medical): No    Lack of Transportation (Non-Medical): No  Physical Activity: Insufficiently Active (03/08/2023)   Exercise Vital Sign    Days of Exercise per Week: 1 day    Minutes of Exercise per Session: 10 min  Stress: No Stress Concern Present (11/09/2023)   [employer] of Occupational Health - Occupational Stress Questionnaire    Feeling of Stress : Not at all  Social Connections: Socially Integrated (03/08/2023)   Social Connection and Isolation Panel    Frequency of Communication with Friends and Family: More than three times a week    Frequency of Social Gatherings with Friends and Family: Three times a week    Attends Religious Services: More than 4 times per year    Active Member of Clubs or Organizations: Yes    Attends Engineer, structural: More than 4 times per year    Marital Status: Married   Family History  Problem Relation Age of Onset   Hypertension Mother    Other Maternal Grandfather        bile duct cancer   Colon cancer Paternal Grandmother        37-70   Allergies  Allergen Reactions   Dapagliflozin Other (See Comments)    (Farxiga) UTI   Liraglutide Nausea Only and Nausea And Vomiting    Victoza Victoza   Current Outpatient Medications  Medication Sig Dispense Refill   ALPRAZolam  (XANAX ) 0.5 MG tablet TAKE 1 TAB AT BEDTIME AS NEEDED FOR ANXIETY AND 1/2 TAB DURING THE DAY FOR PANIC ATTACKS. DO NOT TAKE WITH MUSCLE RELAXANTS 30 tablet 0   atorvastatin (LIPITOR) 40 MG tablet Take 1 tablet by mouth daily.     BD PEN NEEDLE NANO 2ND GEN 32G X 4 MM MISC SMARTSIG:1 Each Daily     benzonatate  (TESSALON ) 100 MG capsule Take 1 capsule (100 mg total) by mouth 2 (two) times daily as needed for cough. (Patient not taking: Reported on 11/22/2023) 20 capsule 0   busPIRone  (BUSPAR ) 10 MG tablet TAKE 1 TABLET BY MOUTH TWICE A DAY  (Patient not taking: Reported on 11/22/2023) 180 tablet 1   Continuous Blood Gluc Transmit (DEXCOM G6 TRANSMITTER) MISC USE TO MONITOR BLOOD SUGARS. CHANGE EVERY 90 DAYS.     Continuous Glucose Sensor (DEXCOM G6 SENSOR) MISC SMARTSIG:1 EACH TOPICAL EVERY 10 DAYS 9 each 0  gabapentin  (NEURONTIN ) 100 MG capsule TAKE 1 CAPSULE BY MOUTH AT BEDTIME. 90 capsule 2   glimepiride (AMARYL) 4 MG tablet Take 2 mg by mouth.     guaiFENesin -dextromethorphan (ROBITUSSIN DM) 100-10 MG/5ML syrup Take 5 mLs by mouth every 4 (four) hours as needed for cough. (Patient not taking: Reported on 11/09/2023) 118 mL 0   Insulin  Glargine (BASAGLAR KWIKPEN) 100 UNIT/ML Start 10 units once a day.  Titrate by 1 unit daily until fasting blood sugar under 125.  Max daily dose 50 units.     JANUVIA 100 MG tablet Take 100 mg by mouth daily.     levonorgestrel  (MIRENA ) 20 MCG/24HR IUD 1 each by Intrauterine route once.     levothyroxine (SYNTHROID) 75 MCG tablet Take 75 mcg by mouth daily.     lisinopril (ZESTRIL) 5 MG tablet Take by mouth.     metoprolol  succinate (TOPROL -XL) 50 MG 24 hr tablet TAKE 1 TABLET BY MOUTH EVERY DAY 90 tablet 1   ondansetron  (ZOFRAN -ODT) 4 MG disintegrating tablet Take 4 mg by mouth every 8 (eight) hours as needed.     pantoprazole  (PROTONIX ) 40 MG tablet TAKE 1 TABLET BY MOUTH EVERY DAY 90 tablet 0   rOPINIRole  (REQUIP ) 0.5 MG tablet Take 1 tablet (0.5 mg total) by mouth at bedtime. (Patient not taking: Reported on 11/22/2023) 30 tablet 3   sertraline  (ZOLOFT ) 100 MG tablet Take 1 tablet (100 mg total) by mouth daily. 90 tablet 3   SYNJARDY XR 12.10-998 MG TB24 Take 2 tablets by mouth every morning.     No current facility-administered medications for this visit.   No results found.  Review of Systems:   A ROS was performed including pertinent positives and negatives as documented in the HPI.  Physical Exam :   Constitutional: NAD and appears stated age Neurological: Alert and oriented Psych:  Appropriate affect and cooperative There were no vitals taken for this visit.   Comprehensive Musculoskeletal Exam:    Inspection Right Left  Skin No atrophy or gross abnormalities appreciated No atrophy or gross abnormalities appreciated  Palpation    Tenderness Greater trochanter, gluteus medius None  Crepitus None None  Range of Motion    Flexion (passive) 120 120  Extension 30 30  IR 40, mild pain 45  ER 50 50  Strength    Flexion  5/5 5/5  Extension 5/5 5/5  Special Tests    FABER Negative Negative  FADIR Positive n   ER Lag/Capsular Insufficiency Negative Negative  Instability Negative Negative  Sacroiliac pain Negative  Negative   Instability    Generalized Laxity No No  Neurologic    sciatic, femoral, obturator nerves intact to light sensation  Vascular/Lymphatic    DP pulse 2+ 2+  Lumbar Exam    Patient has symmetric lumbar range of motion with negative pain referral to hip   Weakness with resisted abduction of the right hip  Imaging:   Xray (3 views right hip): Normal  MRI (right hip): There is a hyportrophic and partially torn labrum as well as insertional undersurface tearing of the gluteus medius  I personally reviewed and interpreted the radiographs.   Assessment:   48 y.o. female with right lateral based hip pain.  I did describe that I do believe her hip pain is somewhat complex.  MRI today and exam are consistent with hip labral tearing.  She is no longer getting any relief from physical therapy..  She is having a hard time laying on  the side.  Given this I did discuss the role right hip labral repair.    Plan :    -Plan for right hip arthroscopy with labral repair   After a lengthy discussion of treatment options, including risks, benefits, alternatives, complications of surgical and nonsurgical conservative options, the patient elected surgical repair.   The patient  is aware of the material risks  and complications including, but not limited  to injury to adjacent structures, neurovascular injury, infection, numbness, bleeding, implant failure, thermal burns, stiffness, persistent pain, failure to heal, disease transmission from allograft, need for further surgery, dislocation, anesthetic risks, blood clots, risks of death,and others. The probabilities of surgical success and failure discussed with patient given their particular co-morbidities.The time and nature of expected rehabilitation and recovery was discussed.The patient's questions were all answered preoperatively.  No barriers to understanding were noted. I explained the natural history of the disease process and Rx rationale.  I explained to the patient what I considered to be reasonable expectations given their personal situation.  The final treatment plan was arrived at through a shared patient decision making process model.     Procedure Note  Patient: Lori [name]             Date of Birth: 29-Jan-1976           MRN: [id number]             Visit Date: 01/20/2024  Procedures: Visit Diagnoses: No diagnosis found.  No procedures performed      I personally saw and evaluated the patient, and participated in the management and treatment plan.  Elspeth Parker, MD Attending Physician, Orthopedic Surgery  This document was dictated using Dragon voice recognition software. A reasonable attempt at proof reading has been made to minimize errors.

## 2024-01-25 ENCOUNTER — Encounter (HOSPITAL_BASED_OUTPATIENT_CLINIC_OR_DEPARTMENT_OTHER): Payer: Self-pay | Admitting: Orthopaedic Surgery

## 2024-02-25 ENCOUNTER — Other Ambulatory Visit: Payer: Self-pay | Admitting: Family Medicine

## 2024-02-25 DIAGNOSIS — F320 Major depressive disorder, single episode, mild: Secondary | ICD-10-CM

## 2024-02-27 NOTE — Telephone Encounter (Signed)
 Requested Prescriptions  Pending Prescriptions Disp Refills   sertraline  (ZOLOFT ) 100 MG tablet [Pharmacy Med Name: SERTRALINE  HCL 100 MG TABLET] 90 tablet 3    Sig: TAKE 1 TABLET BY MOUTH EVERY DAY     Psychiatry:  Antidepressants - SSRI - sertraline  Passed - 02/27/2024 11:36 AM      Passed - AST in normal range and within 360 days    AST  Date Value Ref Range Status  11/09/2023 23 0 - 40 IU/L Final   SGOT(AST)  Date Value Ref Range Status  04/10/2014 19 15 - 37 Unit/L Final         Passed - ALT in normal range and within 360 days    ALT  Date Value Ref Range Status  11/09/2023 24 0 - 32 IU/L Final   SGPT (ALT)  Date Value Ref Range Status  04/10/2014 32 U/L Final    Comment:    14-63 NOTE: New Reference Range 12/25/13          Passed - Completed PHQ-2 or PHQ-9 in the last 360 days      Passed - Valid encounter within last 6 months    Recent Outpatient Visits           3 months ago Other fatigue   [place] [facility] Simmons[place]Rob[place], [place], MD   5 months ago Other urinary incontinence   [place] [facility] [place], [place], PA-C   6 months ago Restless legs   [place] [facility] [place], [place], MD   7 months ago Acute non-recurrent frontal sinusitis    [facility] Sch[place], [place], MD

## 2024-03-08 ENCOUNTER — Encounter (HOSPITAL_BASED_OUTPATIENT_CLINIC_OR_DEPARTMENT_OTHER): Payer: Self-pay | Admitting: Orthopaedic Surgery

## 2024-03-08 DIAGNOSIS — S73191A Other sprain of right hip, initial encounter: Secondary | ICD-10-CM | POA: Diagnosis not present

## 2024-03-08 NOTE — Progress Notes (Signed)
 Date of Surgery: 03/08/2024  INDICATIONS: Ms. Weitzman is a 48 y.o.-year-old female with right hip labral tear.  The risk and benefits of the procedure were discussed in detail and documented in the pre-operative evaluation.   PREOPERATIVE DIAGNOSIS: 1. Right hip labral repair  POSTOPERATIVE DIAGNOSIS: Same.  PROCEDURE: 1. Right hip labral repair  SURGEON: Elspeth LITTIE Parker MD  ASSISTANT: Conley Dawson, ATC  ANESTHESIA:  general  IV FLUIDS AND URINE: See anesthesia record.  ANTIBIOTICS: Ancef  ESTIMATED BLOOD LOSS: 5 mL.  IMPLANTS:  * No surgical log found *  DRAINS: None  CULTURES: None  COMPLICATIONS: none  DESCRIPTION OF PROCEDURE:   Cartilage Intact femoral and acetabular cartilage   Labrum Hyperplastic appearing   Boundaries of labral tear Convention (3 o'clock anterior, 9 o'clock posterior) Anterior boundary: 3 o'clock Posterior boundary: 1 o'clock   OPERATIVE REPORT:  The patient was brought to the operating room, placed supine on the operating table, and bony prominences were padded.  The traction boots were applied with padding to ensure that safe traction could be applied through the feet.  The contralateral limb was abducted maximally and light traction was applied.  The operative leg was brought into neutral position.  The flouroscopic c-arm was brought between the legs for an AP image.  The patient was prepped and draped in a sterile fashion.  Time-out was performed and landmarks were identified. Traction was obtained and care was taken to ensure the least amount of force necessary to allow safe access to the joint of 8-22mm.  This was checked with fluoroscopy.    Next we placed an anterolateral portal under the assistance of fluoroscopy.  First, fluoroscopy was used to estimate the trajectory and starting point.  A 5mm incision with a #11 blade was made and a straight hemostat was used to dilate the portal through the appropriate tract.  We then placed a  14-gauge hypodermic needle with careful technique to be as close to the femoral head as possible and parallel to the sorcele to ensure no iatrogenic damage to the labrum.  This released the negative pressure environment and the amount of traction was adjusted to maintain the 8-69mm of distraction.  A nitinol wire was placed through the needle and flouroscopy was used to ensure it extended to the medial wall of the acetabulum.  The Flowport from [employer] was placed over the wire and the nitinol wire was retracted to just inside the capsule during insertion of the dilator and cannula to minimize the risk of breakage. The arthroscope was placed next and we visualized the anterior triangle.     We then placed the anterior portal under direct visualization using the technique described above.  This was safely placed as well without damage to the labrum or femoral head.  We then switched our arthroscope to the anterior portal to ensure we were not through the labrum - we were safely through the capsule only.  We then proceeded with periportal capsulotomies utilizing the Samurai blade in each portal without connecting the two.  We identified the anterior inferior iliac spine proximally, the psoas tendon medially and the rectus tendon laterally as landmarks.  We then proceeded with a diagnostic arthroscopy - the results can be found in the findings section above.    We then used the radiofrequency device to clear the superior acetabulum and expose the subspinous region.  Next we exposed the acetabular rim leaving the chondral labral junction intact.    When adequate  reshaping was obtained we then proceeded with the labral repair. We placed 2 anchors at the 1:00 and 2:30 positions. The sutures were passed using the crescent Nanopass from [employer].  This resulted in anatomic labral repair.  We debrided the loose cartilage at the rim and residual degenerative labral tissue.  Traction was let down with total  traction time of 23 minutes.     Finally, we performed a complete capsular closure with tape suture.  She was replaced in the anterior and posterior limb of the reported capsulotomy with excellent apposition. We then removed the arthroscope and closed the incisions with 3-0 nylon simple stitches.  A sterile dressing was applied..  The patient was awakened from anesthesia and transferred to PACU in stable condition. Postoperative care includes:       POSTOPERATIVE PLAN:    Weight bearing as tolerated operative extremity Formal physical therapy will begin immediately within the first weeks of surgery ASA 325 Daily for DVT prophylaxis     Elspeth LITTIE Parker, MD 2:51 PM

## 2024-03-12 NOTE — Therapy (Signed)
 OUTPATIENT PHYSICAL THERAPY LOWER EXTREMITY EVALUATION   Patient Name: Lori [name] MRN: [id number] DOB:[birth year], 48 y.o., female Today's Date: 03/13/2024  END OF SESSION:  PT End of Session - 03/13/24 0949     Visit Number 1    Number of Visits 25    Date for Recertification  06/05/24    PT Start Time 0949    PT Stop Time 1028    PT Time Calculation (min) 39 min    Activity Tolerance Patient tolerated treatment well    Behavior During Therapy [facility] for tasks assessed/performed          Past Medical History:  Diagnosis Date   Bacterial vaginitis    Cancer (HCC) 1987   Hodgkins Lymphoma   CHF (congestive heart failure) (HCC)    Colon polyps    Complication of anesthesia    nausea/vomiting   Depression    Diabetes mellitus without complication (HCC)    History of abnormal mammogram 2013   @[facility] - NEG MRI AND BX OCT 2014 NORMAL   History of mammogram 03/2015   BREAST MRI AT [facility] WNL   History of Papanicolaou smear of cervix 10/08/2013; 08/04/15   -/-; ASCUS/HPV NEG;   Hodgkin's disease (HCC) 1987   She had surgical resection of Lymph nodes and chemo + rad tx's.   Hypertension    Hypothyroidism    2ND TO CA TX   PONV (postoperative nausea and vomiting)    Restrictive lung disease    Skin cancer may 2025   place on back   Vitamin D  deficiency    Past Surgical History:  Procedure Laterality Date   APPENDECTOMY     CHOLECYSTECTOMY     DEEP NECK LYMPH NODE BIOPSY / EXCISION  1987   hodgkin lymphoma   DILATATION & CURETTAGE/HYSTEROSCOPY WITH MYOSURE N/A 03/10/2017   Procedure: DILATATION & CURETTAGE/HYSTEROSCOPY WITH MYOSURE;  Surgeon: Leonce Garnette BIRCH, MD;  Location: [facility] ORS;  Service: Gynecology;  Laterality: N/A;   INTRAUTERINE DEVICE (IUD) INSERTION  [id number]; 02/06/09; 12/12/13   INTRAUTERINE DEVICE (IUD) INSERTION N/A 03/10/2017   Procedure: INTRAUTERINE DEVICE (IUD) INSERTION;  Surgeon: Leonce Garnette BIRCH, MD;  Location: [facility] ORS;  Service: Gynecology;   Laterality: N/A;   SPLENECTOMY     THYROIDECTOMY     Patient Active Problem List   Diagnosis Date Noted   Restless legs 03/10/2023   Hypotension 05/13/2022   History of pyelonephritis 12/11/2021   AKI (acute kidney injury) 12/08/2021   Chronic headache disorder 12/08/2021   Lactic acidosis 12/08/2021   Sepsis with acute renal failure (HCC) 12/08/2021   Night sweats 10/12/2021   Depression, major, single episode, mild 10/12/2021   Chronic constipation 10/01/2021   Excessive use of nonsteroidal anti-inflammatory drugs (NSAIDs) 10/01/2021   Serrated adenoma of colon 10/01/2021   Leukocytosis 08/17/2021   GAD (generalized anxiety disorder) 07/06/2021   Insomnia 07/06/2021   Tear of right acetabular labrum 07/06/2021   Mixed incontinence urge and stress 10/27/2020   Trochanteric bursitis of right hip 06/13/2020   History of antineoplastic chemotherapy 07/18/2019   Hot flash, menopausal 10/18/2018   Toxicity of radiation, late effect 01/04/2018   History of tobacco use 09/25/2017   Endometrial mass 01/18/2017   DUB (dysfunctional uterine bleeding) 01/18/2017   Bacterial vaginosis 01/18/2017   Allergic rhinitis 06/03/2015   Anxiety 06/03/2015   Acute systolic heart failure (HCC) 06/03/2015   Clinical depression 06/03/2015   Diabetes mellitus, type 2 (HCC) 06/03/2015   Acid reflux 06/03/2015   History  of biliary T-tube placement 06/03/2015   HD (Hodgkin's disease) (HCC) 06/03/2015   RAD (reactive airway disease) 06/03/2015   Fast heart beat 06/03/2015   Chronic systolic heart failure (HCC) 05/23/2014   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 Patient is unaware of any protocol from the MD. No formal precautions as far as the patient is aware.   PERTINENT HISTORY: S/p R hip arthroscopy with labral repair on 03/08/2024. PAIN:  Are you having pain? Yes: NPRS scale: 5/10  Pain location: R hip  Pain description: sore, aches Aggravating factors: walking Relieving factors: medication, rest   PRECAUTIONS: Other: Labral repair  RED FLAGS: None   WEIGHT BEARING RESTRICTIONS: Yes WBAT  FALLS:  Has patient fallen in last 6 months? No  LIVING ENVIRONMENT: Lives with: lives with their spouse Lives in: House/apartment Stairs: No  OCCUPATION: work from home   PLOF: Independent  PATIENT GOALS: to get R leg stronger   OBJECTIVE:  Note: Objective measures were completed at Evaluation unless otherwise noted.  DIAGNOSTIC FINDINGS: N/A   PATIENT SURVEYS:  LEFS  Extreme difficulty/unable (0), Quite a bit of difficulty (1), Moderate difficulty (2), Little difficulty (3), No difficulty (4) Survey date:    Any of your usual work, housework or school activities 2  2. Usual hobbies, recreational or sporting activities 2  3. Getting into/out of the bath 0  4. Walking between rooms 3  5. Putting on socks/shoes 3  6. Squatting  0  7. Lifting an object, like a bag of groceries from the floor 0  8. Performing light activities around your home 3  9. Performing heavy activities around your home 0  10. Getting into/out of a car 2  11. Walking 2 blocks 3  12. Walking 1 mile 0  13. Going up/down 10 stairs (1 flight) 0  14. Standing for 1 hour 0  15.  sitting for 1 hour 3  16. Running on even ground 0  17. Running on uneven ground 0  18. Making sharp turns while running fast 0  19.  Hopping  0  20. Rolling over in bed 2  Score total:  27/80 = 33.8%     COGNITION: Overall cognitive status: Within functional limits for tasks assessed     SENSATION: Slight numbness/tingling on distal quad   POSTURE: No Significant postural limitations  PALPATION: General soreness to palpation near incision   LOWER EXTREMITY ROM: will measure once clearance from MD   Active ROM Right eval Left eval  Hip flexion    Hip extension    Hip abduction    Hip adduction    Hip internal rotation    Hip external rotation    Knee flexion    Knee extension    Ankle dorsiflexion    Ankle plantarflexion    Ankle inversion    Ankle eversion     (Blank rows = not tested)  LOWER EXTREMITY MMT:  MMT Right eval Left eval  Hip flexion  4  Hip extension    Hip abduction  4  Hip adduction    Hip internal rotation    Hip external rotation    Knee flexion  4+  Knee extension  4+  Ankle dorsiflexion  4+  Ankle plantarflexion    Ankle inversion    Ankle eversion     (Blank rows = not tested)   FUNCTIONAL TESTS:  5 times sit to stand: 22  seconds  6 minute walk test: to be completed visit #2   GAIT: Distance walked: 30' Assistive device utilized: None Level of assistance: Complete Independence Comments: decreased stance time on R, mild trendelenburg on R                                                                                                                            TREATMENT DATE: 03/13/24   HEP initiation with return demonstration   PATIENT EDUCATION:  Education details: HEP, POC, goals  Person educated: Patient Education method: Explanation, Demonstration, and Handouts Education comprehension: verbalized understanding and returned demonstration  HOME EXERCISE PROGRAM: Access Code: [email or web address] URL: [email or web address] Date: 03/13/2024 Prepared by: Maryanne Finder  Exercises - Mini Squat with Counter Support  - 1-2 x daily - 5-7 x weekly - 3  sets - 10 reps - Standing Hip Abduction with Counter Support  - 1-2 x daily - 5-7 x weekly - 3 sets - 10 reps - Standing March with Counter Support  - 1-2 x daily - 5-7 x weekly - 3 sets - 10 reps - Heel Raises with Counter Support  - 1-2 x daily - 5-7 x weekly - 3 sets - 10 reps - Standing Hip Extension with Counter Support  - 1-2 x daily - 5-7 x weekly - 3 sets - 10 reps  ASSESSMENT:  CLINICAL IMPRESSION: Patient is a 48 y.o. female who was seen today for physical therapy evaluation and treatment following R hip labral repair on 03/08/2024. Patient ambulatory at eval with no AD but demonstrates decreased stance time and mild trendelenburg on R. HEP initiated on hip strengthening with no resistance. Patient to message MD about potential protocol after surgery. Patient will benefit from skilled PT services to address listed impairments to improve overall mobility and return to PLOF.     OBJECTIVE IMPAIRMENTS: Abnormal gait, decreased activity tolerance, decreased balance, decreased endurance, difficulty walking, decreased ROM, decreased strength, impaired flexibility, impaired sensation, and pain.   ACTIVITY LIMITATIONS: lifting, sitting, standing, squatting, stairs, and locomotion level  PARTICIPATION LIMITATIONS: cleaning, laundry, driving, community activity, and yard work  PERSONAL FACTORS: Age, Past/current experiences, Profession, and Time since onset of injury/illness/exacerbation are also affecting patient's functional outcome.   REHAB POTENTIAL: Good  CLINICAL DECISION MAKING: Stable/uncomplicated  EVALUATION COMPLEXITY: Low   GOALS: Goals reviewed with patient? Yes  SHORT TERM GOALS: Target date: 04/24/2024  Patient will be independent in HEP to improve strength/mobility for better functional independence with ADLs. Baseline: Goal status: INITIAL  LONG TERM GOALS: Target  date: 06/05/2024  Patient will increase LEFS by 9 points to demonstrate improved functional mobility  and increased tolerance with ADLs.  Baseline: 03/13/2024: 27/80 Goal status: INITIAL  2.  Patient will increase BLE gross strength to 4+/5 as to improve functional strength for independent gait, increased standing tolerance and increased ADL ability. Baseline: 03/13/2024: will further assess strength once clearance from MD Goal status: INITIAL  3.  Patient will improve by 48m (164') in order to demonstrate clinically significant improvement in cardiopulmonary endurance and community ambulation Baseline: 03/13/2024: to be completed visit #2  Goal status: INITIAL  4.  Patient will improve 5xSTS time by 5 seconds to demonstrate improved functional LE strength. Baseline: 03/13/2024: 22 seconds  Goal status: INITIAL  5.  Patient will be able to ambulate around her block without pain to demonstrate improved tolerance to activity and return to PLOF Baseline: 03/13/2024: unable to ambulate outdoors at this time Goal status: INITIAL  6.  Patient will report a worst pain of <3/10 on NRPS in R hip to improve tolerance with ADLs and reduced symptoms with activities. Baseline: 03/13/2024: on eval 5/10  Goal status: INITIAL   PLAN:  PT FREQUENCY: 1-2x/week  PT DURATION: 12 weeks  PLANNED INTERVENTIONS: 97164- PT Re-evaluation, 97750- Physical Performance Testing, 97110-Therapeutic exercises, 97530- Therapeutic activity, 97112- Neuromuscular re-education, 97535- Self Care, [id number]- Manual therapy, [phone]- Gait training, [phone]- Electrical stimulation (unattended), [phone]- Electrical stimulation (manual), [phone] (1-2 muscles), [phone] (3+ muscles)- Dry Needling, Patient/Family education, Balance training, Stair training, Joint mobilization, Joint manipulation, Cryotherapy, and Moist heat  PLAN FOR NEXT SESSION: , HEP review, R hip strengthening    Maryanne Finder, PT, DPT Physical Therapist - [place]  [facility] 03/13/2024, 12:13 PM

## 2024-03-13 ENCOUNTER — Ambulatory Visit: Payer: PRIVATE HEALTH INSURANCE | Attending: Orthopaedic Surgery

## 2024-03-13 DIAGNOSIS — R262 Difficulty in walking, not elsewhere classified: Secondary | ICD-10-CM | POA: Diagnosis present

## 2024-03-13 DIAGNOSIS — M6281 Muscle weakness (generalized): Secondary | ICD-10-CM | POA: Diagnosis present

## 2024-03-13 DIAGNOSIS — S73191A Other sprain of right hip, initial encounter: Secondary | ICD-10-CM | POA: Diagnosis not present

## 2024-03-13 DIAGNOSIS — M25551 Pain in right hip: Secondary | ICD-10-CM | POA: Insufficient documentation

## 2024-03-20 ENCOUNTER — Ambulatory Visit: Payer: PRIVATE HEALTH INSURANCE

## 2024-03-20 DIAGNOSIS — R262 Difficulty in walking, not elsewhere classified: Secondary | ICD-10-CM

## 2024-03-20 DIAGNOSIS — M25551 Pain in right hip: Secondary | ICD-10-CM

## 2024-03-20 DIAGNOSIS — M6281 Muscle weakness (generalized): Secondary | ICD-10-CM

## 2024-03-20 NOTE — Therapy (Signed)
 OUTPATIENT PHYSICAL THERAPY LOWER EXTREMITY TREATMENT   Patient Name: Lori [name] MRN: [id number] DOB:[birth year], 48 y.o., female Today's Date: 03/20/2024  END OF SESSION:  PT End of Session - 03/20/24 0945     Visit Number 2    Number of Visits 25    Date for Recertification  06/05/24    PT Start Time 0945    PT Stop Time 1026    PT Time Calculation (min) 41 min    Activity Tolerance Patient tolerated treatment well    Behavior During Therapy [facility] for tasks assessed/performed           Past Medical History:  Diagnosis Date   Bacterial vaginitis    Cancer (HCC) 1987   Hodgkins Lymphoma   CHF (congestive heart failure) (HCC)    Colon polyps    Complication of anesthesia    nausea/vomiting   Depression    Diabetes mellitus without complication (HCC)    History of abnormal mammogram 2013   @[facility] - NEG MRI AND BX OCT 2014 NORMAL   History of mammogram 03/2015   BREAST MRI AT [facility] WNL   History of Papanicolaou smear of cervix 10/08/2013; 08/04/15   -/-; ASCUS/HPV NEG;   Hodgkin's disease (HCC) 1987   She had surgical resection of Lymph nodes and chemo + rad tx's.   Hypertension    Hypothyroidism    2ND TO CA TX   PONV (postoperative nausea and vomiting)    Restrictive lung disease    Skin cancer may 2025   place on back   Vitamin D  deficiency    Past Surgical History:  Procedure Laterality Date   APPENDECTOMY     CHOLECYSTECTOMY     DEEP NECK LYMPH NODE BIOPSY / EXCISION  1987   hodgkin lymphoma   DILATATION & CURETTAGE/HYSTEROSCOPY WITH MYOSURE N/A 03/10/2017   Procedure: DILATATION & CURETTAGE/HYSTEROSCOPY WITH MYOSURE;  Surgeon: Leonce Garnette BIRCH, MD;  Location: [facility] ORS;  Service: Gynecology;  Laterality: N/A;   INTRAUTERINE DEVICE (IUD) INSERTION  [id number]; 02/06/09; 12/12/13   INTRAUTERINE DEVICE (IUD) INSERTION N/A 03/10/2017   Procedure: INTRAUTERINE DEVICE (IUD) INSERTION;  Surgeon: Leonce Garnette BIRCH, MD;  Location: [facility] ORS;  Service: Gynecology;   Laterality: N/A;   SPLENECTOMY     THYROIDECTOMY     Patient Active Problem List   Diagnosis Date Noted   Restless legs 03/10/2023   Hypotension 05/13/2022   History of pyelonephritis 12/11/2021   AKI (acute kidney injury) 12/08/2021   Chronic headache disorder 12/08/2021   Lactic acidosis 12/08/2021   Sepsis with acute renal failure (HCC) 12/08/2021   Night sweats 10/12/2021   Depression, major, single episode, mild 10/12/2021   Chronic constipation 10/01/2021   Excessive use of nonsteroidal anti-inflammatory drugs (NSAIDs) 10/01/2021   Serrated adenoma of colon 10/01/2021   Leukocytosis 08/17/2021   GAD (generalized anxiety disorder) 07/06/2021   Insomnia 07/06/2021   Tear of right acetabular labrum 07/06/2021   Mixed incontinence urge and stress 10/27/2020   Trochanteric bursitis of right hip 06/13/2020   History of antineoplastic chemotherapy 07/18/2019   Hot flash, menopausal 10/18/2018   Toxicity of radiation, late effect 01/04/2018   History of tobacco use 09/25/2017   Endometrial mass 01/18/2017   DUB (dysfunctional uterine bleeding) 01/18/2017   Bacterial vaginosis 01/18/2017   Allergic rhinitis 06/03/2015   Anxiety 06/03/2015   Acute systolic heart failure (HCC) 06/03/2015   Clinical depression 06/03/2015   Diabetes mellitus, type 2 (HCC) 06/03/2015   Acid reflux 06/03/2015  History of biliary T-tube placement 06/03/2015   HD (Hodgkin's disease) (HCC) 06/03/2015   RAD (reactive airway disease) 06/03/2015   Fast heart beat 06/03/2015   Chronic systolic heart failure (HCC) 05/23/2014   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 Patient is unaware of any protocol from the MD. No formal precautions as far as the patient is aware.   PERTINENT HISTORY: S/p R hip arthroscopy with labral repair on 03/08/2024. PAIN:  Are you having pain? Yes: NPRS scale: 5/10  Pain location: R hip  Pain description: sore, aches Aggravating factors: walking Relieving factors: medication, rest   PRECAUTIONS: Other: Labral repair  RED FLAGS: None   WEIGHT BEARING RESTRICTIONS: Yes WBAT  FALLS:  Has patient fallen in last 6 months? No  LIVING ENVIRONMENT: Lives with: lives with their spouse Lives in: House/apartment Stairs: No  OCCUPATION: work from home   PLOF: Independent  PATIENT GOALS: to get R leg stronger   OBJECTIVE:  Note: Objective measures were completed at Evaluation unless otherwise noted.  DIAGNOSTIC FINDINGS: N/A   PATIENT SURVEYS:  LEFS  Extreme difficulty/unable (0), Quite a bit of difficulty (1), Moderate difficulty (2), Little difficulty (3), No difficulty (4) Survey date:    Any of your usual work, housework or school activities 2  2. Usual hobbies, recreational or sporting activities 2  3. Getting into/out of the bath 0  4. Walking between rooms 3  5. Putting on socks/shoes 3  6. Squatting  0  7. Lifting an object, like a bag of groceries from the floor 0  8. Performing light activities around your home 3  9. Performing heavy activities around your home 0  10. Getting into/out of a car 2  11. Walking 2 blocks 3  12. Walking 1 mile 0  13. Going up/down 10 stairs (1 flight) 0  14. Standing for 1 hour 0  15.  sitting for 1 hour 3  16. Running on even ground 0  17. Running on uneven ground 0  18. Making sharp turns while running fast 0  19.  Hopping  0  20. Rolling over in bed 2  Score total:  27/80 = 33.8%     COGNITION: Overall cognitive status: Within functional limits for tasks assessed     SENSATION: Slight numbness/tingling on distal quad   POSTURE: No Significant postural limitations  PALPATION: General soreness to palpation near incision   LOWER EXTREMITY ROM: will measure once clearance from MD   Active ROM Right eval Left eval  Hip flexion    Hip extension    Hip abduction    Hip adduction    Hip internal rotation    Hip external rotation    Knee flexion    Knee extension    Ankle dorsiflexion    Ankle plantarflexion    Ankle inversion    Ankle eversion     (Blank rows = not tested)  LOWER EXTREMITY MMT:  MMT Right eval Left eval  Hip flexion  4  Hip extension    Hip abduction  4  Hip adduction    Hip internal rotation    Hip external rotation    Knee flexion  4+  Knee extension  4+  Ankle dorsiflexion  4+  Ankle plantarflexion    Ankle inversion    Ankle eversion     (Blank rows = not tested)   FUNCTIONAL TESTS:  5 times sit to stand: 22  seconds  6 minute walk test: to be completed visit #2   GAIT: Distance walked: 64' Assistive device utilized: None Level of assistance: Complete Independence Comments: decreased stance time on R, mild trendelenburg on R                                                                                                                            TREATMENT DATE: 03/20/24   Subjective: Patient reports doing well overall. Continues to complain of pain in R glute area and numbness/tingling to R thigh  : 1200'  Therapeutic Activity:  TRX squats 3 x 10  Sidestepping with RTB around knees 4 x 15'   Therapeutic Exercise: Knee extension at OMEGA 3 x 10 - 10#  Hamstring curls at OMEGA 3 x 10 - 20#  Seated hip adduction ball squeeze 3 x 10 with 3 second hold   PATIENT EDUCATION:  Education details: HEP, POC, goals  Person educated:  Patient Education method: Explanation, Demonstration, and Handouts Education comprehension: verbalized understanding and returned demonstration  HOME EXERCISE PROGRAM: Access Code: [email or web address] URL: [email or web address] Date: 03/13/2024 Prepared by: Maryanne Finder  Exercises - Mini Squat with Counter Support  - 1-2 x daily - 5-7 x weekly - 3 sets - 10 reps - Standing Hip Abduction with Counter Support  - 1-2 x daily - 5-7 x weekly - 3 sets - 10 reps - Standing March with Counter Support  - 1-2 x daily - 5-7 x weekly - 3 sets - 10 reps - Heel Raises with Counter Support  - 1-2 x daily - 5-7 x weekly - 3 sets - 10 reps - Standing Hip Extension with Counter Support  - 1-2 x daily - 5-7 x weekly - 3 sets - 10 reps  ASSESSMENT:  CLINICAL IMPRESSION:   Continued PT POC focused on rehab after R hip labral repair. Session focused on quad and hip strengthening with good tolerance. Discussed talking with MD about protocol and restrictions to activity/stretching, etc. Encouraged to follow up as scheduled. Patient will benefit from skilled PT services to address listed impairments to improve overall mobility and return to PLOF.     OBJECTIVE IMPAIRMENTS: Abnormal gait, decreased activity tolerance, decreased balance, decreased endurance, difficulty walking, decreased ROM, decreased strength, impaired flexibility, impaired sensation, and pain.   ACTIVITY LIMITATIONS: lifting, sitting, standing, squatting, stairs, and locomotion level  PARTICIPATION LIMITATIONS: cleaning, laundry, driving, community activity, and yard work  PERSONAL FACTORS: Age, Past/current experiences, Profession, and Time since onset of injury/illness/exacerbation are also affecting patient's functional  outcome.   REHAB POTENTIAL: Good  CLINICAL DECISION MAKING: Stable/uncomplicated  EVALUATION COMPLEXITY: Low   GOALS: Goals reviewed with patient? Yes  SHORT TERM GOALS: Target date: 04/24/2024  Patient will  be independent in HEP to improve strength/mobility for better functional independence with ADLs. Baseline: Goal status: INITIAL  LONG TERM GOALS: Target date: 06/05/2024  Patient will increase LEFS by 9 points to demonstrate improved functional mobility and increased tolerance with ADLs.  Baseline: 03/13/2024: 27/80 Goal status: INITIAL  2.  Patient will increase BLE gross strength to 4+/5 as to improve functional strength for independent gait, increased standing tolerance and increased ADL ability. Baseline: 03/13/2024: will further assess strength once clearance from MD Goal status: INITIAL  3.  Patient will improve by 68m (164') in order to demonstrate clinically significant improvement in cardiopulmonary endurance and community ambulation Baseline: 03/13/2024: to be completed visit #2  Goal status: INITIAL  4.  Patient will improve 5xSTS time by 5 seconds to demonstrate improved functional LE strength. Baseline: 03/13/2024: 22 seconds  Goal status: INITIAL  5.  Patient will be able to ambulate around her block without pain to demonstrate improved tolerance to activity and return to PLOF Baseline: 03/13/2024: unable to ambulate outdoors at this time Goal status: INITIAL  6.  Patient will report a worst pain of <3/10 on NRPS in R hip to improve tolerance with ADLs and reduced symptoms with activities. Baseline: 03/13/2024: on eval 5/10  Goal status: INITIAL   PLAN:  PT FREQUENCY: 1-2x/week  PT DURATION: 12 weeks  PLANNED INTERVENTIONS: 97164- PT Re-evaluation, 97750- Physical Performance Testing, 97110-Therapeutic exercises, 97530- Therapeutic activity, 97112- Neuromuscular re-education, 97535- Self Care, 02859- Manual therapy, [phone]- Gait training, [phone]- Electrical stimulation (unattended), [phone]- Electrical stimulation (manual), [phone] (1-2 muscles), 20561 (3+ muscles)- Dry Needling, Patient/Family education, Balance training, Stair training, Joint mobilization, Joint  manipulation, Cryotherapy, and Moist heat  PLAN FOR NEXT SESSION: , HEP review, R hip strengthening    Maryanne Finder, PT, DPT Physical Therapist - [place]  [facility] 03/20/2024, 9:45 AM

## 2024-03-22 ENCOUNTER — Encounter (HOSPITAL_BASED_OUTPATIENT_CLINIC_OR_DEPARTMENT_OTHER): Payer: PRIVATE HEALTH INSURANCE | Admitting: Orthopaedic Surgery

## 2024-03-23 ENCOUNTER — Ambulatory Visit (INDEPENDENT_AMBULATORY_CARE_PROVIDER_SITE_OTHER): Payer: PRIVATE HEALTH INSURANCE | Admitting: Orthopaedic Surgery

## 2024-03-23 DIAGNOSIS — S73191A Other sprain of right hip, initial encounter: Secondary | ICD-10-CM

## 2024-03-23 NOTE — Progress Notes (Signed)
 Post Operative Evaluation    Procedure/Date of Surgery: Right hip labral repair 10/12  Interval History:   Presents 2 weeks status post above procedure.  Overall she is doing extremely well.  She is walking without significant pain just some mild soreness   PMH/PSH/Family History/Social History/Meds/Allergies:    Past Medical History:  Diagnosis Date   Bacterial vaginitis    Cancer (HCC) 1987   Hodgkins Lymphoma   CHF (congestive heart failure) (HCC)    Colon polyps    Complication of anesthesia    nausea/vomiting   Depression    Diabetes mellitus without complication (HCC)    History of abnormal mammogram 2013   @[facility] - NEG MRI AND BX OCT 2014 NORMAL   History of mammogram 03/2015   BREAST MRI AT [facility] WNL   History of Papanicolaou smear of cervix 10/08/2013; 08/04/15   -/-; ASCUS/HPV NEG;   Hodgkin's disease (HCC) 1987   She had surgical resection of Lymph nodes and chemo + rad tx's.   Hypertension    Hypothyroidism    2ND TO CA TX   PONV (postoperative nausea and vomiting)    Restrictive lung disease    Skin cancer may 2025   place on back   Vitamin D  deficiency    Past Surgical History:  Procedure Laterality Date   APPENDECTOMY     CHOLECYSTECTOMY     DEEP NECK LYMPH NODE BIOPSY / EXCISION  1987   hodgkin lymphoma   DILATATION & CURETTAGE/HYSTEROSCOPY WITH MYOSURE N/A 03/10/2017   Procedure: DILATATION & CURETTAGE/HYSTEROSCOPY WITH MYOSURE;  Surgeon: Leonce Garnette BIRCH, MD;  Location: [facility] OR[facility];  Service: Gynecology;  Laterality: N/A;   INTRAUTERINE DEVICE (IUD) INSERTION  [id number]; 02/06/09; 12/12/13   INTRAUTERINE DEVICE (IUD) INSERTION N/A 03/10/2017   Procedure: INTRAUTERINE DEVICE (IUD) INSERTION;  Surgeon: Leonce Garnette BIRCH, MD;  Location: [facility] OR[facility];  Service: Gynecology;  Laterality: N/A;   SPLENECTOMY     THYROIDECTOMY     Social History   Socioeconomic History   Marital status: Married    Spouse name: Not on file    Number of children: Not on file   Years of education: 12   Highest education level: GED or equivalent  Occupational History   Occupation: QUOTATION [employer]    Employer: [employer]  Tobacco Use   Smoking status: Former    Current packs/day: 0.00    Average packs/day: 0.3 packs/day for 11.7 years (3.0 ttl pk-yrs)    Types: Cigarettes    Start date: 03/10/2006    Quit date: 03/10/2016    Years since quitting: 8.0   Smokeless tobacco: Never  Vaping Use   Vaping status: Never Used  Substance and Sexual Activity   Alcohol use: Yes    Alcohol/week: 2.0 standard drinks of alcohol    Types: 2 Glasses of wine per week    Comment: occasional   Drug use: No   Sexual activity: Yes    Birth control/protection: I.U.D.    Comment: Mirena   Other Topics Concern   Not on file  Social History Narrative   Not on file   Social Drivers of Health   Financial Resource Strain: Low Risk  (11/22/2023)   Overall Financial Resource Strain (CARDIA)    Difficulty of Paying Living Expenses: Not hard at all  Food Insecurity: No Food  Insecurity (11/22/2023)   Hunger Vital Sign    Worried About Running Out of Food in the Last Year: Never true    Ran Out of Food in the Last Year: Never true  Transportation Needs: No Transportation Needs (11/22/2023)   PRAPARE - Administrator, Civil Service (Medical): No    Lack of Transportation (Non-Medical): No  Physical Activity: Insufficiently Active (03/08/2023)   Exercise Vital Sign    Days of Exercise per Week: 1 day    Minutes of Exercise per Session: 10 min  Stress: No Stress Concern Present (11/09/2023)   [employer] of Occupational Health - Occupational Stress Questionnaire    Feeling of Stress : Not at all  Social Connections: Socially Integrated (03/08/2023)   Social Connection and Isolation Panel    Frequency of Communication with Friends and Family: More than three times a week    Frequency of Social Gatherings with Friends and Family:  Three times a week    Attends Religious Services: More than 4 times per year    Active Member of Clubs or Organizations: Yes    Attends Engineer, structural: More than 4 times per year    Marital Status: Married   Family History  Problem Relation Age of Onset   Hypertension Mother    Other Maternal Grandfather        bile duct cancer   Colon cancer Paternal Grandmother        89-70   Allergies  Allergen Reactions   Dapagliflozin Other (See Comments)    (Farxiga) UTI   Liraglutide Nausea Only and Nausea And Vomiting    Victoza Victoza   Current Outpatient Medications  Medication Sig Dispense Refill   ALPRAZolam  (XANAX ) 0.5 MG tablet TAKE 1 TAB AT BEDTIME AS NEEDED FOR ANXIETY AND 1/2 TAB DURING THE DAY FOR PANIC ATTACKS. DO NOT TAKE WITH MUSCLE RELAXANTS 30 tablet 0   aspirin  EC 325 MG tablet Take 1 tablet (325 mg total) by mouth daily. 14 tablet 0   atorvastatin (LIPITOR) 40 MG tablet Take 1 tablet by mouth daily.     BD PEN NEEDLE NANO 2ND GEN 32G X 4 MM MISC SMARTSIG:1 Each Daily     benzonatate  (TESSALON ) 100 MG capsule Take 1 capsule (100 mg total) by mouth 2 (two) times daily as needed for cough. (Patient not taking: Reported on 11/22/2023) 20 capsule 0   busPIRone  (BUSPAR ) 10 MG tablet TAKE 1 TABLET BY MOUTH TWICE A DAY (Patient not taking: Reported on 11/22/2023) 180 tablet 1   Continuous Blood Gluc Transmit (DEXCOM G6 TRANSMITTER) MISC USE TO MONITOR BLOOD SUGARS. CHANGE EVERY 90 DAYS.     Continuous Glucose Sensor (DEXCOM G6 SENSOR) MISC SMARTSIG:1 EACH TOPICAL EVERY 10 DAYS 9 each 0   gabapentin  (NEURONTIN ) 100 MG capsule TAKE 1 CAPSULE BY MOUTH AT BEDTIME. 90 capsule 2   glimepiride (AMARYL) 4 MG tablet Take 2 mg by mouth.     guaiFENesin -dextromethorphan (ROBITUSSIN DM) 100-10 MG/5ML syrup Take 5 mLs by mouth every 4 (four) hours as needed for cough. (Patient not taking: Reported on 11/09/2023) 118 mL 0   Insulin  Glargine (BASAGLAR KWIKPEN) 100 UNIT/ML Start 10  units once a day.  Titrate by 1 unit daily until fasting blood sugar under 125.  Max daily dose 50 units.     JANUVIA 100 MG tablet Take 100 mg by mouth daily.     levonorgestrel  (MIRENA ) 20 MCG/24HR IUD 1 each by Intrauterine route once.  levothyroxine (SYNTHROID) 75 MCG tablet Take 75 mcg by mouth daily.     lisinopril (ZESTRIL) 5 MG tablet Take by mouth.     metoprolol  succinate (TOPROL -XL) 50 MG 24 hr tablet TAKE 1 TABLET BY MOUTH EVERY DAY 90 tablet 1   ondansetron  (ZOFRAN -ODT) 4 MG disintegrating tablet Take 4 mg by mouth every 8 (eight) hours as needed.     oxyCODONE  (ROXICODONE ) 5 MG immediate release tablet Take 1 tablet (5 mg total) by mouth every 4 (four) hours as needed for severe pain (pain score 7-10) or breakthrough pain. 10 tablet 0   pantoprazole  (PROTONIX ) 40 MG tablet TAKE 1 TABLET BY MOUTH EVERY DAY 90 tablet 0   rOPINIRole  (REQUIP ) 0.5 MG tablet Take 1 tablet (0.5 mg total) by mouth at bedtime. (Patient not taking: Reported on 11/22/2023) 30 tablet 3   sertraline  (ZOLOFT ) 100 MG tablet TAKE 1 TABLET BY MOUTH EVERY DAY 90 tablet 0   SYNJARDY XR 12.10-998 MG TB24 Take 2 tablets by mouth every morning.     No current facility-administered medications for this visit.   No results found.  Review of Systems:   A ROS was performed including pertinent positives and negatives as documented in the HPI.   Musculoskeletal Exam:    There were no vitals taken for this visit.  Right hip incisions are well-appearing without erythema or drainage.  30 degrees internal/external rotation of the right hip without pain.  Good abduction strength is neurosensory exams intact  Imaging:      I personally reviewed and interpreted the radiographs.   Assessment:   2 weeks status post right hip arthroscopic labral repair at this time she is doing extremely well she will continue to follow the labral repair protocol.  I will plan to see her back in 4 weeks for reassessment  Plan :    -  Return to clinic 4 weeks for reassessment      I personally saw and evaluated the patient, and participated in the management and treatment plan.  Elspeth Parker, MD Attending Physician, Orthopedic Surgery  This document was dictated using Dragon voice recognition software. A reasonable attempt at proof reading has been made to minimize errors.

## 2024-03-26 NOTE — Therapy (Signed)
 OUTPATIENT PHYSICAL THERAPY LOWER EXTREMITY TREATMENT   Patient Name: Lori [name] MRN: [id number] DOB:[birth year], 48 y.o., female Today's Date: 03/27/2024  END OF SESSION:  PT End of Session - 03/27/24 1428     Visit Number 3    Number of Visits 25    Date for Recertification  06/05/24    PT Start Time 1430    PT Stop Time 1511    PT Time Calculation (min) 41 min    Activity Tolerance Patient tolerated treatment well    Behavior During Therapy [facility] for tasks assessed/performed            Past Medical History:  Diagnosis Date   Bacterial vaginitis    Cancer (HCC) 1987   Hodgkins Lymphoma   CHF (congestive heart failure) (HCC)    Colon polyps    Complication of anesthesia    nausea/vomiting   Depression    Diabetes mellitus without complication (HCC)    History of abnormal mammogram 2013   @[facility] - NEG MRI AND BX OCT 2014 NORMAL   History of mammogram 03/2015   BREAST MRI AT [facility] WNL   History of Papanicolaou smear of cervix 10/08/2013; 08/04/15   -/-; ASCUS/HPV NEG;   Hodgkin's disease (HCC) 1987   She had surgical resection of Lymph nodes and chemo + rad tx's.   Hypertension    Hypothyroidism    2ND TO CA TX   PONV (postoperative nausea and vomiting)    Restrictive lung disease    Skin cancer may 2025   place on back   Vitamin D  deficiency    Past Surgical History:  Procedure Laterality Date   APPENDECTOMY     CHOLECYSTECTOMY     DEEP NECK LYMPH NODE BIOPSY / EXCISION  1987   hodgkin lymphoma   DILATATION & CURETTAGE/HYSTEROSCOPY WITH MYOSURE N/A 03/10/2017   Procedure: DILATATION & CURETTAGE/HYSTEROSCOPY WITH MYOSURE;  Surgeon: Leonce Garnette BIRCH, MD;  Location: [facility];  Service: Gynecology;  Laterality: N/A;   INTRAUTERINE DEVICE (IUD) INSERTION  [id number]; 02/06/09; 12/12/13   INTRAUTERINE DEVICE (IUD) INSERTION N/A 03/10/2017   Procedure: INTRAUTERINE DEVICE (IUD) INSERTION;  Surgeon: Leonce Garnette BIRCH, MD;  Location: [facility] OR[facility];  Service: Gynecology;   Laterality: N/A;   SPLENECTOMY     THYROIDECTOMY     Patient Active Problem List   Diagnosis Date Noted   Restless legs 03/10/2023   Hypotension 05/13/2022   History of pyelonephritis 12/11/2021   AKI (acute kidney injury) 12/08/2021   Chronic headache disorder 12/08/2021   Lactic acidosis 12/08/2021   Sepsis with acute renal failure (HCC) 12/08/2021   Night sweats 10/12/2021   Depression, major, single episode, mild 10/12/2021   Chronic constipation 10/01/2021   Excessive use of nonsteroidal anti-inflammatory drugs (NSAIDs) 10/01/2021   Serrated adenoma of colon 10/01/2021   Leukocytosis 08/17/2021   GAD (generalized anxiety disorder) 07/06/2021   Insomnia 07/06/2021   Tear of right acetabular labrum 07/06/2021   Mixed incontinence urge and stress 10/27/2020   Trochanteric bursitis of right hip 06/13/2020   History of antineoplastic chemotherapy 07/18/2019   Hot flash, menopausal 10/18/2018   Toxicity of radiation, late effect 01/04/2018   History of tobacco use 09/25/2017   Endometrial mass 01/18/2017   DUB (dysfunctional uterine bleeding) 01/18/2017   Bacterial vaginosis 01/18/2017   Allergic rhinitis 06/03/2015   Anxiety 06/03/2015   Acute systolic heart failure (HCC) 06/03/2015   Clinical depression 06/03/2015   Diabetes mellitus, type 2 (HCC) 06/03/2015   Acid reflux 06/03/2015  History of biliary T-tube placement 06/03/2015   HD (Hodgkin's disease) (HCC) 06/03/2015   RAD (reactive airway disease) 06/03/2015   Fast heart beat 06/03/2015   Chronic systolic heart failure (HCC) 05/23/2014   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 Patient is unaware of any protocol from the MD. No formal precautions as far as the patient is aware.   PERTINENT HISTORY: S/p R hip arthroscopy with labral repair on 03/08/2024. PAIN:  Are you having pain? Yes: NPRS scale: 5/10  Pain location: R hip  Pain description: sore, aches Aggravating factors: walking Relieving factors: medication, rest   PRECAUTIONS: Other: Labral repair  RED FLAGS: None   WEIGHT BEARING RESTRICTIONS: Yes WBAT  FALLS:  Has patient fallen in last 6 months? No  LIVING ENVIRONMENT: Lives with: lives with their spouse Lives in: House/apartment Stairs: No  OCCUPATION: work from home   PLOF: Independent  PATIENT GOALS: to get R leg stronger   OBJECTIVE:  Note: Objective measures were completed at Evaluation unless otherwise noted.  DIAGNOSTIC FINDINGS: N/A   PATIENT SURVEYS:  LEFS  Extreme difficulty/unable (0), Quite a bit of difficulty (1), Moderate difficulty (2), Little difficulty (3), No difficulty (4) Survey date:    Any of your usual work, housework or school activities 2  2. Usual hobbies, recreational or sporting activities 2  3. Getting into/out of the bath 0  4. Walking between rooms 3  5. Putting on socks/shoes 3  6. Squatting  0  7. Lifting an object, like a bag of groceries from the floor 0  8. Performing light activities around your home 3  9. Performing heavy activities around your home 0  10. Getting into/out of a car 2  11. Walking 2 blocks 3  12. Walking 1 mile 0  13. Going up/down 10 stairs (1 flight) 0  14. Standing for 1 hour 0  15.  sitting for 1 hour 3  16. Running on even ground 0  17. Running on uneven ground 0  18. Making sharp turns while running fast 0  19.  Hopping  0  20. Rolling over in bed 2  Score total:  27/80 = 33.8%     COGNITION: Overall cognitive status: Within functional limits for tasks assessed     SENSATION: Slight numbness/tingling on distal quad   POSTURE: No Significant postural limitations  PALPATION: General soreness to palpation near incision   LOWER EXTREMITY ROM: will measure once clearance from MD   Active ROM Right eval Left eval  Hip flexion    Hip extension    Hip abduction    Hip adduction    Hip internal rotation    Hip external rotation    Knee flexion    Knee extension    Ankle dorsiflexion    Ankle plantarflexion    Ankle inversion    Ankle eversion     (Blank rows = not tested)  LOWER EXTREMITY MMT:  MMT Right eval Left eval  Hip flexion  4  Hip extension    Hip abduction  4  Hip adduction    Hip internal rotation    Hip external rotation    Knee flexion  4+  Knee extension  4+  Ankle dorsiflexion  4+  Ankle plantarflexion    Ankle inversion    Ankle eversion     (Blank rows = not tested)   FUNCTIONAL TESTS:  5 times sit to stand: 22  seconds  6 minute walk test: to be completed visit #2   GAIT: Distance walked: 75' Assistive device utilized: None Level of assistance: Complete Independence Comments: decreased stance time on R, mild trendelenburg on R                                                                                                                            TREATMENT DATE: 03/27/24    Subjective: Patient reports soreness after last session. Denies pain on arrival.   Therapeutic Activity:  TRX squats 3 x 10  Fwd step downs 6 3 x 10 with B UE support  Sidestepping with RTB around knees 4 x 15'   Therapeutic Exercise: Knee extension at OMEGA 3 x 10 - 10#  Hamstring curls at OMEGA 3 x 10 - 20#  Standing marching 3 x 10 with 3# AW  Standing hip abduction 3 x 10 with 3# AW   PATIENT EDUCATION:  Education details: HEP, POC, goals  Person educated:  Patient Education method: Explanation, Demonstration, and Handouts Education comprehension: verbalized understanding and returned demonstration  HOME EXERCISE PROGRAM: Access Code: [email or web address] URL: [email or web address] Date: 03/13/2024 Prepared by: Maryanne Finder  Exercises - Mini Squat with Counter Support  - 1-2 x daily - 5-7 x weekly - 3 sets - 10 reps - Standing Hip Abduction with Counter Support  - 1-2 x daily - 5-7 x weekly - 3 sets - 10 reps - Standing March with Counter Support  - 1-2 x daily - 5-7 x weekly - 3 sets - 10 reps - Heel Raises with Counter Support  - 1-2 x daily - 5-7 x weekly - 3 sets - 10 reps - Standing Hip Extension with Counter Support  - 1-2 x daily - 5-7 x weekly - 3 sets - 10 reps  ASSESSMENT:  CLINICAL IMPRESSION:    Continued PT POC focused on rehab after R hip labral repair. Session focused on quad and hip strengthening with good tolerance. Continues to demonstrate general weakness with complaints of muscle fatigue during session. Encouraged to follow up as scheduled. Patient will benefit from skilled PT services to address listed impairments to improve overall mobility and return to PLOF.     OBJECTIVE IMPAIRMENTS: Abnormal gait, decreased activity tolerance, decreased balance, decreased endurance, difficulty walking, decreased ROM, decreased strength, impaired flexibility, impaired sensation, and pain.   ACTIVITY LIMITATIONS: lifting, sitting, standing, squatting, stairs, and locomotion level  PARTICIPATION LIMITATIONS: cleaning, laundry, driving, community activity, and yard work  PERSONAL FACTORS: Age, Past/current experiences, Profession, and  Time since onset of injury/illness/exacerbation are also affecting patient's functional outcome.   REHAB POTENTIAL: Good  CLINICAL DECISION MAKING: Stable/uncomplicated  EVALUATION COMPLEXITY: Low   GOALS: Goals reviewed with patient? Yes  SHORT TERM GOALS: Target date: 04/24/2024  Patient  will be independent in HEP to improve strength/mobility for better functional independence with ADLs. Baseline: Goal status: INITIAL  LONG TERM GOALS: Target date: 06/05/2024  Patient will increase LEFS by 9 points to demonstrate improved functional mobility and increased tolerance with ADLs.  Baseline: 03/13/2024: 27/80 Goal status: INITIAL  2.  Patient will increase BLE gross strength to 4+/5 as to improve functional strength for independent gait, increased standing tolerance and increased ADL ability. Baseline: 03/13/2024: will further assess strength once clearance from MD Goal status: INITIAL  3.  Patient will improve by 18m (164') in order to demonstrate clinically significant improvement in cardiopulmonary endurance and community ambulation Baseline: 03/13/2024: to be completed visit #2  Goal status: INITIAL  4.  Patient will improve 5xSTS time by 5 seconds to demonstrate improved functional LE strength. Baseline: 03/13/2024: 22 seconds  Goal status: INITIAL  5.  Patient will be able to ambulate around her block without pain to demonstrate improved tolerance to activity and return to PLOF Baseline: 03/13/2024: unable to ambulate outdoors at this time Goal status: INITIAL  6.  Patient will report a worst pain of <3/10 on NRPS in R hip to improve tolerance with ADLs and reduced symptoms with activities. Baseline: 03/13/2024: on eval 5/10  Goal status: INITIAL   PLAN:  PT FREQUENCY: 1-2x/week  PT DURATION: 12 weeks  PLANNED INTERVENTIONS: 97164- PT Re-evaluation, 97750- Physical Performance Testing, 97110-Therapeutic exercises, 97530- Therapeutic activity, 97112- Neuromuscular re-education, 97535- Self Care, 02859- Manual therapy, [phone]- Gait training, [phone]- Electrical stimulation (unattended), [phone]- Electrical stimulation (manual), [phone] (1-2 muscles), [phone] (3+ muscles)- Dry Needling, Patient/Family education, Balance training, Stair training, Joint mobilization, Joint  manipulation, Cryotherapy, and Moist heat  PLAN FOR NEXT SESSION: , HEP review, R hip strengthening    Maryanne Finder, PT, DPT Physical Therapist - [facility]  [facility] 03/27/2024, 2:28 PM

## 2024-03-27 ENCOUNTER — Ambulatory Visit: Payer: PRIVATE HEALTH INSURANCE

## 2024-03-27 DIAGNOSIS — M6281 Muscle weakness (generalized): Secondary | ICD-10-CM

## 2024-03-27 DIAGNOSIS — R262 Difficulty in walking, not elsewhere classified: Secondary | ICD-10-CM

## 2024-03-27 DIAGNOSIS — M25551 Pain in right hip: Secondary | ICD-10-CM | POA: Diagnosis not present

## 2024-03-28 NOTE — Therapy (Incomplete)
 OUTPATIENT PHYSICAL THERAPY LOWER EXTREMITY TREATMENT   Patient Name: Lori [name] MRN: [id number] DOB:05-01-1976, 48 y.o., female Today's Date: 03/28/2024  END OF SESSION:      Past Medical History:  Diagnosis Date   Bacterial vaginitis    Cancer (HCC) 1987   Hodgkins Lymphoma   CHF (congestive heart failure) (HCC)    Colon polyps    Complication of anesthesia    nausea/vomiting   Depression    Diabetes mellitus without complication (HCC)    History of abnormal mammogram 2013   @[facility] - NEG MRI AND BX OCT 2014 NORMAL   History of mammogram 03/2015   BREAST MRI AT [facility] WNL   History of Papanicolaou smear of cervix 10/08/2013; 08/04/15   -/-; ASCUS/HPV NEG;   Hodgkin's disease (HCC) 1987   She had surgical resection of Lymph nodes and chemo + rad tx's.   Hypertension    Hypothyroidism    2ND TO [place] TX   PONV (postoperative nausea and vomiting)    Restrictive lung disease    Skin cancer may 2025   place on back   Vitamin D  deficiency    Past Surgical History:  Procedure Laterality Date   APPENDECTOMY     CHOLECYSTECTOMY     DEEP NECK LYMPH NODE BIOPSY / EXCISION  1987   hodgkin lymphoma   DILATATION & CURETTAGE/HYSTEROSCOPY WITH MYOSURE N/A 03/10/2017   Procedure: DILATATION & CURETTAGE/HYSTEROSCOPY WITH MYOSURE;  Surgeon: Leonce Garnette BIRCH, MD;  Location: [facility] OR[facility];  Service: Gynecology;  Laterality: N/A;   INTRAUTERINE DEVICE (IUD) INSERTION  [id number]; 02/06/09; 12/12/13   INTRAUTERINE DEVICE (IUD) INSERTION N/A 03/10/2017   Procedure: INTRAUTERINE DEVICE (IUD) INSERTION;  Surgeon: Leonce Garnette BIRCH, MD;  Location: [facility] ORS;  Service: Gynecology;  Laterality: N/A;   SPLENECTOMY     THYROIDECTOMY     Patient Active Problem List   Diagnosis Date Noted   Restless legs 03/10/2023   Hypotension 05/13/2022   History of pyelonephritis 12/11/2021   AKI (acute kidney injury) 12/08/2021   Chronic headache disorder 12/08/2021   Lactic acidosis 12/08/2021   Sepsis with  acute renal failure (HCC) 12/08/2021   Night sweats 10/12/2021   Depression, major, single episode, mild 10/12/2021   Chronic constipation 10/01/2021   Excessive use of nonsteroidal anti-inflammatory drugs (NSAIDs) 10/01/2021   Serrated adenoma of colon 10/01/2021   Leukocytosis 08/17/2021   GAD (generalized anxiety disorder) 07/06/2021   Insomnia 07/06/2021   Tear of right acetabular labrum 07/06/2021   Mixed incontinence urge and stress 10/27/2020   Trochanteric bursitis of right hip 06/13/2020   History of antineoplastic chemotherapy 07/18/2019   Hot flash, menopausal 10/18/2018   Toxicity of radiation, late effect 01/04/2018   History of tobacco use 09/25/2017   Endometrial mass 01/18/2017   DUB (dysfunctional uterine bleeding) 01/18/2017   Bacterial vaginosis 01/18/2017   Allergic rhinitis 06/03/2015   Anxiety 06/03/2015   Acute systolic heart failure (HCC) 06/03/2015   Clinical depression 06/03/2015   Diabetes mellitus, type 2 (HCC) 06/03/2015   Acid reflux 06/03/2015   History of biliary T-tube placement 06/03/2015   HD (Hodgkin's disease) (HCC) 06/03/2015   RAD (reactive airway disease) 06/03/2015   Fast heart beat 06/03/2015   Chronic systolic heart failure (HCC) 05/23/2014   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 Patient is unaware of any protocol from the MD. No formal precautions as far as the  patient is aware.   PERTINENT HISTORY: S/p R hip arthroscopy with labral repair on 03/08/2024. PAIN:  Are you having pain? Yes: NPRS scale: 5/10  Pain location: R hip  Pain description: sore, aches Aggravating factors: walking Relieving factors: medication, rest   PRECAUTIONS: Other: Labral repair  RED FLAGS: None   WEIGHT BEARING RESTRICTIONS: Yes WBAT  FALLS:  Has patient fallen in last 6 months? No  LIVING ENVIRONMENT: Lives with: lives with their spouse Lives in: House/apartment Stairs: No  OCCUPATION: work from home   PLOF: Independent  PATIENT GOALS: to get R leg stronger   OBJECTIVE:  Note: Objective measures were completed at Evaluation unless otherwise noted.  DIAGNOSTIC FINDINGS: N/A   PATIENT SURVEYS:  LEFS  Extreme difficulty/unable (0), Quite a bit of difficulty (1), Moderate difficulty (2), Little difficulty (3), No difficulty (4) Survey date:    Any of your usual work, housework or school activities 2  2. Usual hobbies, recreational or sporting activities 2  3. Getting into/out of the bath 0  4. Walking between rooms 3  5. Putting on socks/shoes 3  6. Squatting  0  7. Lifting an object, like a bag of groceries from the floor 0  8. Performing light activities around your home 3  9. Performing heavy activities around your home 0  10. Getting into/out of a car 2  11. Walking 2 blocks 3  12. Walking 1 mile 0  13. Going up/down 10 stairs (1 flight) 0  14. Standing for 1 hour 0  15.  sitting for 1 hour 3  16. Running on even ground 0  17. Running on uneven ground 0  18. Making sharp turns while running fast 0  19. Hopping  0  20. Rolling over in bed 2  Score total:  27/80 = 33.8%     COGNITION: Overall cognitive status: Within functional limits for tasks assessed     SENSATION: Slight numbness/tingling on distal quad   POSTURE: No Significant postural limitations  PALPATION: General soreness to palpation near incision   LOWER  EXTREMITY ROM: will measure once clearance from MD   Active ROM Right eval Left eval  Hip flexion    Hip extension    Hip abduction    Hip adduction    Hip internal rotation    Hip external rotation    Knee flexion    Knee extension    Ankle dorsiflexion    Ankle plantarflexion    Ankle inversion    Ankle eversion     (Blank rows = not tested)  LOWER EXTREMITY MMT:  MMT Right eval Left eval  Hip flexion  4  Hip extension    Hip abduction  4  Hip adduction    Hip internal rotation    Hip external rotation    Knee flexion  4+  Knee extension  4+  Ankle dorsiflexion  4+  Ankle plantarflexion    Ankle inversion    Ankle eversion     (Blank rows = not tested)   FUNCTIONAL TESTS:  5 times sit to stand: 22  seconds  6 minute walk test: to be completed visit #2   GAIT: Distance walked: 49' Assistive device utilized: None Level of assistance: Complete Independence Comments: decreased stance time on R, mild trendelenburg on R                                                                                                                            TREATMENT DATE: 03/28/24   ***  Subjective: Patient reports soreness after last session. Denies pain on arrival.   Therapeutic Activity:  TRX squats 3 x 10  Fwd step downs 6 3 x 10 with B UE support  Sidestepping with RTB around knees 4 x 15'   Therapeutic Exercise: Knee extension at OMEGA 3 x 10 - 10#  Hamstring curls at OMEGA 3 x 10 - 20#  Standing marching 3 x 10 with 3# AW  Standing hip abduction 3 x 10 with 3# AW   PATIENT EDUCATION:  Education details: HEP, POC, goals  Person educated: Patient Education method: Explanation, Demonstration, and Handouts Education comprehension: verbalized understanding and returned demonstration  HOME EXERCISE PROGRAM: Access Code: [email or web address] URL: [email or web address] Date: 03/13/2024 Prepared by: Maryanne Finder  Exercises - Mini Squat with Counter Support   - 1-2 x daily - 5-7 x weekly - 3 sets - 10 reps - Standing Hip Abduction with Counter Support  - 1-2 x daily - 5-7 x weekly - 3 sets - 10 reps - Standing March with Counter Support  - 1-2 x daily - 5-7 x weekly - 3 sets - 10 reps - Heel Raises with Counter Support  - 1-2 x daily - 5-7 x weekly - 3 sets - 10 reps - Standing Hip Extension with Counter Support  - 1-2 x daily - 5-7 x weekly - 3 sets - 10 reps  ASSESSMENT:  CLINICAL IMPRESSION:   ***  Continued PT POC focused on rehab after R hip labral repair. Session focused on quad and hip strengthening with good tolerance. Continues to demonstrate general weakness with complaints of muscle fatigue during session. Encouraged to follow up as scheduled. Patient will benefit from skilled PT services to address listed impairments to improve overall mobility and return to PLOF.     OBJECTIVE IMPAIRMENTS: Abnormal gait, decreased activity tolerance, decreased balance, decreased endurance, difficulty walking, decreased ROM, decreased strength, impaired flexibility, impaired sensation, and pain.   ACTIVITY LIMITATIONS: lifting, sitting, standing, squatting, stairs, and locomotion level  PARTICIPATION LIMITATIONS: cleaning, laundry, driving, community activity, and yard work  PERSONAL FACTORS: Age, Past/current experiences, Profession, and Time since onset of injury/illness/exacerbation are also affecting patient's functional outcome.   REHAB POTENTIAL: Good  CLINICAL DECISION MAKING: Stable/uncomplicated  EVALUATION COMPLEXITY: Low   GOALS: Goals reviewed with patient? Yes  SHORT TERM GOALS: Target date: 04/24/2024  Patient will be independent in HEP to improve strength/mobility for better functional independence with ADLs. Baseline: Goal status: INITIAL  LONG TERM GOALS: Target date: 06/05/2024  Patient will increase LEFS by 9 points  to demonstrate improved functional mobility and increased tolerance with ADLs.  Baseline: 03/13/2024:  27/80 Goal status: INITIAL  2.  Patient will increase BLE gross strength to 4+/5 as to improve functional strength for independent gait, increased standing tolerance and increased ADL ability. Baseline: 03/13/2024: will further assess strength once clearance from MD Goal status: INITIAL  3.  Patient will improve by 31m (164') in order to demonstrate clinically significant improvement in cardiopulmonary endurance and community ambulation Baseline: 03/13/2024: to be completed visit #2  Goal status: INITIAL  4.  Patient will improve 5xSTS time by 5 seconds to demonstrate improved functional LE strength. Baseline: 03/13/2024: 22 seconds  Goal status: INITIAL  5.  Patient will be able to ambulate around her block without pain to demonstrate improved tolerance to activity and return to PLOF Baseline: 03/13/2024: unable to ambulate outdoors at this time Goal status: INITIAL  6.  Patient will report a worst pain of <3/10 on NRPS in R hip to improve tolerance with ADLs and reduced symptoms with activities. Baseline: 03/13/2024: on eval 5/10  Goal status: INITIAL   PLAN:  PT FREQUENCY: 1-2x/week  PT DURATION: 12 weeks  PLANNED INTERVENTIONS: 97164- PT Re-evaluation, 97750- Physical Performance Testing, 97110-Therapeutic exercises, 97530- Therapeutic activity, 97112- Neuromuscular re-education, 97535- Self Care, 02859- Manual therapy, [phone]- Gait training, [phone]- Electrical stimulation (unattended), [phone]- Electrical stimulation (manual), [phone] (1-2 muscles), 20561 (3+ muscles)- Dry Needling, Patient/Family education, Balance training, Stair training, Joint mobilization, Joint manipulation, Cryotherapy, and Moist heat  PLAN FOR NEXT SESSION: R hip strengthening    Maryanne Finder, PT, DPT Physical Therapist - [place]  [facility] 03/28/2024, 10:48 AM

## 2024-03-29 ENCOUNTER — Ambulatory Visit: Payer: PRIVATE HEALTH INSURANCE

## 2024-04-02 ENCOUNTER — Encounter: Payer: BC Managed Care – PPO | Admitting: Dermatology

## 2024-04-03 ENCOUNTER — Ambulatory Visit: Payer: PRIVATE HEALTH INSURANCE

## 2024-04-05 ENCOUNTER — Ambulatory Visit: Payer: PRIVATE HEALTH INSURANCE

## 2024-04-05 DIAGNOSIS — M25551 Pain in right hip: Secondary | ICD-10-CM | POA: Diagnosis not present

## 2024-04-05 DIAGNOSIS — M6281 Muscle weakness (generalized): Secondary | ICD-10-CM

## 2024-04-05 DIAGNOSIS — R262 Difficulty in walking, not elsewhere classified: Secondary | ICD-10-CM

## 2024-04-05 NOTE — Therapy (Signed)
 OUTPATIENT PHYSICAL THERAPY LOWER EXTREMITY TREATMENT   Patient Name: Lori [name] MRN: [id number] DOB:[birth year], 48 y.o., female Today's Date: 04/05/2024  END OF SESSION:  PT End of Session - 04/05/24 1023     Visit Number 4    Number of Visits 25    Date for Recertification  06/05/24    PT Start Time 1023    PT Stop Time 1105    PT Time Calculation (min) 42 min    Activity Tolerance Patient tolerated treatment well    Behavior During Therapy [facility] for tasks assessed/performed             Past Medical History:  Diagnosis Date   Bacterial vaginitis    Cancer (HCC) 1987   Hodgkins Lymphoma   CHF (congestive heart failure) (HCC)    Colon polyps    Complication of anesthesia    nausea/vomiting   Depression    Diabetes mellitus without complication (HCC)    History of abnormal mammogram 2013   @[facility] - NEG MRI AND BX OCT 2014 NORMAL   History of mammogram 03/2015   BREAST MRI AT [facility] WNL   History of Papanicolaou smear of cervix 10/08/2013; 08/04/15   -/-; ASCUS/HPV NEG;   Hodgkin's disease (HCC) 1987   She had surgical resection of Lymph nodes and chemo + rad tx's.   Hypertension    Hypothyroidism    2ND TO CA TX   PONV (postoperative nausea and vomiting)    Restrictive lung disease    Skin cancer may 2025   place on back   Vitamin D  deficiency    Past Surgical History:  Procedure Laterality Date   APPENDECTOMY     CHOLECYSTECTOMY     DEEP NECK LYMPH NODE BIOPSY / EXCISION  1987   hodgkin lymphoma   DILATATION & CURETTAGE/HYSTEROSCOPY WITH MYOSURE N/A 03/10/2017   Procedure: DILATATION & CURETTAGE/HYSTEROSCOPY WITH MYOSURE;  Surgeon: Leonce Garnette BIRCH, MD;  Location: [facility] ORS;  Service: Gynecology;  Laterality: N/A;   INTRAUTERINE DEVICE (IUD) INSERTION  [id number]; 02/06/09; 12/12/13   INTRAUTERINE DEVICE (IUD) INSERTION N/A 03/10/2017   Procedure: INTRAUTERINE DEVICE (IUD) INSERTION;  Surgeon: Leonce Garnette BIRCH, MD;  Location: [facility] ORS;  Service: Gynecology;   Laterality: N/A;   SPLENECTOMY     THYROIDECTOMY     Patient Active Problem List   Diagnosis Date Noted   Restless legs 03/10/2023   Hypotension 05/13/2022   History of pyelonephritis 12/11/2021   AKI (acute kidney injury) 12/08/2021   Chronic headache disorder 12/08/2021   Lactic acidosis 12/08/2021   Sepsis with acute renal failure (HCC) 12/08/2021   Night sweats 10/12/2021   Depression, major, single episode, mild 10/12/2021   Chronic constipation 10/01/2021   Excessive use of nonsteroidal anti-inflammatory drugs (NSAIDs) 10/01/2021   Serrated adenoma of colon 10/01/2021   Leukocytosis 08/17/2021   GAD (generalized anxiety disorder) 07/06/2021   Insomnia 07/06/2021   Tear of right acetabular labrum 07/06/2021   Mixed incontinence urge and stress 10/27/2020   Trochanteric bursitis of right hip 06/13/2020   History of antineoplastic chemotherapy 07/18/2019   Hot flash, menopausal 10/18/2018   Toxicity of radiation, late effect 01/04/2018   History of tobacco use 09/25/2017   Endometrial mass 01/18/2017   DUB (dysfunctional uterine bleeding) 01/18/2017   Bacterial vaginosis 01/18/2017   Allergic rhinitis 06/03/2015   Anxiety 06/03/2015   Acute systolic heart failure (HCC) 06/03/2015   Clinical depression 06/03/2015   Diabetes mellitus, type 2 (HCC) 06/03/2015   Acid reflux 06/03/2015  History of biliary T-tube placement 06/03/2015   HD (Hodgkin's disease) (HCC) 06/03/2015   RAD (reactive airway disease) 06/03/2015   Fast heart beat 06/03/2015   Chronic systolic heart failure (HCC) 05/23/2014   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 Patient is unaware of any protocol from the MD. No formal precautions as far as the patient is aware.   PERTINENT HISTORY: S/p R hip arthroscopy with labral repair on 03/08/2024. PAIN:  Are you having pain? Yes: NPRS scale: 5/10  Pain location: R hip  Pain description: sore, aches Aggravating factors: walking Relieving factors: medication, rest   PRECAUTIONS: Other: Labral repair  RED FLAGS: None   WEIGHT BEARING RESTRICTIONS: Yes WBAT  FALLS:  Has patient fallen in last 6 months? No  LIVING ENVIRONMENT: Lives with: lives with their spouse Lives in: House/apartment Stairs: No  OCCUPATION: work from home   PLOF: Independent  PATIENT GOALS: to get R leg stronger   OBJECTIVE:  Note: Objective measures were completed at Evaluation unless otherwise noted.  DIAGNOSTIC FINDINGS: N/A   PATIENT SURVEYS:  LEFS  Extreme difficulty/unable (0), Quite a bit of difficulty (1), Moderate difficulty (2), Little difficulty (3), No difficulty (4) Survey date:    Any of your usual work, housework or school activities 2  2. Usual hobbies, recreational or sporting activities 2  3. Getting into/out of the bath 0  4. Walking between rooms 3  5. Putting on socks/shoes 3  6. Squatting  0  7. Lifting an object, like a bag of groceries from the floor 0  8. Performing light activities around your home 3  9. Performing heavy activities around your home 0  10. Getting into/out of a car 2  11. Walking 2 blocks 3  12. Walking 1 mile 0  13. Going up/down 10 stairs (1 flight) 0  14. Standing for 1 hour 0  15.  sitting for 1 hour 3  16. Running on even ground 0  17. Running on uneven ground 0  18. Making sharp turns while running fast 0  19.  Hopping  0  20. Rolling over in bed 2  Score total:  27/80 = 33.8%     COGNITION: Overall cognitive status: Within functional limits for tasks assessed     SENSATION: Slight numbness/tingling on distal quad   POSTURE: No Significant postural limitations  PALPATION: General soreness to palpation near incision   LOWER EXTREMITY ROM: will measure once clearance from MD   Active ROM Right eval Left eval  Hip flexion    Hip extension    Hip abduction    Hip adduction    Hip internal rotation    Hip external rotation    Knee flexion    Knee extension    Ankle dorsiflexion    Ankle plantarflexion    Ankle inversion    Ankle eversion     (Blank rows = not tested)  LOWER EXTREMITY MMT:  MMT Right eval Left eval  Hip flexion  4  Hip extension    Hip abduction  4  Hip adduction    Hip internal rotation    Hip external rotation    Knee flexion  4+  Knee extension  4+  Ankle dorsiflexion  4+  Ankle plantarflexion    Ankle inversion    Ankle eversion     (Blank rows = not tested)   FUNCTIONAL TESTS:  5 times sit to stand: 22  seconds  6 minute walk test: to be completed visit #2   GAIT: Distance walked: 64' Assistive device utilized: None Level of assistance: Complete Independence Comments: decreased stance time on R, mild trendelenburg on R                                                                                                                            TREATMENT DATE: 04/05/24    Subjective: Patient reports that her hip hasn't had a lot of pain. She feels like her hip has returned to normal. Yesterday she noticed her catch in her R hip while walking at [employer]. No questions or concerns.    Therapeutic Activity:   Matrix Exercise Bike Level 6-2 x 6 min (Seat 11) for LE strength and endurance; PT manually adjusted resistance throughout per patient tolerance.   TRX Squat   2 x 10   - Verbal Cue for larger BOS   Kettle Bell Squat  1 x 10 - 10# KB    2 x 8 - 20# KB   Sidestepping with Resistance   2 x 15' - RTB around ankles  2 x 15' - RTB Around ankles   2 x 15' - RTB around ankles  Therapeutic Exercise:  Standing hip extension   R/L: 3 x 10 - RTB around ankles  Standing Calve Raise   3 x 10  Supine Hip Flexor Stretch (Thomas Test)   R: 30s/bout x 2 in order to improve tissue extensibility  Supine Gluteal Stretch (Cross Over)   R: 30s/bout x 2 in order to improve tissue extensibility  Karolynn Edin Stretch for quadriceps  R: 30s/bout x 2 in order to improve tissue extensibility   PATIENT EDUCATION:  Education details: HEP, POC, goals  Person educated: Patient Education method: Explanation, Demonstration, and Handouts Education comprehension: verbalized understanding and returned demonstration  HOME EXERCISE PROGRAM: Access Code: [email or web address] URL: [email or web address] Date: 04/05/2024 Prepared by: Lonni Pall  Exercises - Standing March with Counter Support  - 1-2 x daily - 5-7 x weekly - 3 sets - 10 reps - Heel Raises with Counter Support  - 1-2 x daily - 5-7 x weekly - 3 sets - 10 reps - Hip Extension with Resistance Loop  - 1 x daily - 5-7 x weekly - 2-3 sets - 10-12 reps - Standing Hip Abduction with Resistance at Ankles and Counter Support  - 1 x daily - 5-7 x weekly - 2-3 sets - 10-12 reps - Mini Squat  - 1 x daily - 5-7  x weekly - 2-3 sets - 10-12 reps - Supine Gluteus Stretch  - 1 x daily - 7 x weekly - 3 sets - 30-60s hold - Hip Flexor Stretch at Edge of Bed  - 1 x daily - 7 x weekly - 3 sets - 30-60s hold - Child's Pose Stretch  - 1 x daily - 7 x weekly - 3 sets - 30s-60 hold  Access Code: [email or web address] URL: [email or web address] Date: 03/13/2024 Prepared by: Maryanne Finder  Exercises - Mini Squat with Counter Support  - 1-2 x daily - 5-7 x weekly - 3 sets - 10 reps - Standing Hip Abduction with Counter Support  - 1-2 x daily - 5-7 x weekly - 3 sets - 10 reps - Standing March  with Counter Support  - 1-2 x daily - 5-7 x weekly - 3 sets - 10 reps - Heel Raises with Counter Support  - 1-2 x daily - 5-7 x weekly - 3 sets - 10 reps - Standing Hip Extension with Counter Support  - 1-2 x daily - 5-7 x weekly - 3 sets - 10 reps  ASSESSMENT:  CLINICAL IMPRESSION:    Continued PT POC focused on rehab after R hip labral repair. Continued focus on LE strengthening for hip and knee musculature. Good demonstration of squatting technique against resistance in today's session. Multimodal cues provided for proper technique and neutral spine. Patient still experiences pain in R hip limiting her full paticipation in community and recreational activities. Encouraged to follow up as scheduled. Patient will benefit from skilled PT services to address listed impairments to improve overall mobility and return to PLOF.     OBJECTIVE IMPAIRMENTS: Abnormal gait, decreased activity tolerance, decreased balance, decreased endurance, difficulty walking, decreased ROM, decreased strength, impaired flexibility, impaired sensation, and pain.   ACTIVITY LIMITATIONS: lifting, sitting, standing, squatting, stairs, and locomotion level  PARTICIPATION LIMITATIONS: cleaning, laundry, driving, community activity, and yard work  PERSONAL FACTORS: Age, Past/current experiences, Profession, and Time since onset of injury/illness/exacerbation are also affecting patient's functional outcome.   REHAB POTENTIAL: Good  CLINICAL DECISION MAKING: Stable/uncomplicated  EVALUATION COMPLEXITY: Low   GOALS: Goals reviewed with patient? Yes  SHORT TERM GOALS: Target date: 04/24/2024  Patient will be independent in HEP to improve strength/mobility for better functional independence with ADLs. Baseline: Goal status: INITIAL  LONG TERM GOALS: Target date: 06/05/2024  Patient will increase LEFS by 9 points to demonstrate improved functional mobility and increased tolerance with ADLs.  Baseline: 03/13/2024:  27/80 Goal status: INITIAL  2.  Patient will increase BLE gross strength to 4+/5 as to improve functional strength for independent gait, increased standing tolerance and increased ADL ability. Baseline: 03/13/2024: will further assess strength once clearance from MD Goal status: INITIAL  3.  Patient will improve by 53m (164') in order to demonstrate clinically significant improvement in cardiopulmonary endurance and community ambulation Baseline: 03/13/2024: 1200'  Goal status: INITIAL  4.  Patient will improve 5xSTS time by 5 seconds to demonstrate improved functional LE strength. Baseline: 03/13/2024: 22 seconds  Goal status: INITIAL  5.  Patient will be able to ambulate around her block without pain to demonstrate improved tolerance to activity and return to PLOF Baseline: 03/13/2024: unable to ambulate outdoors at this time Goal status: INITIAL  6.  Patient will report a worst pain of <3/10 on NRPS in R hip to improve tolerance with ADLs and reduced symptoms with activities. Baseline: 03/13/2024: on eval 5/10  Goal status: INITIAL   PLAN:  PT FREQUENCY: 1-2x/week  PT DURATION: 12 weeks  PLANNED INTERVENTIONS: 97164- PT Re-evaluation, 97750- Physical Performance Testing, 97110-Therapeutic exercises, 97530- Therapeutic activity, 97112- Neuromuscular re-education, 97535- Self Care, 97140- Manual therapy, [phone]- Gait training, [phone]- Electrical stimulation (unattended), [phone]- Electrical stimulation (manual), [phone] (1-2 muscles), [phone] (3+ muscles)- Dry Needling, Patient/Family education, Balance training, Stair training, Joint mobilization, Joint manipulation, Cryotherapy, and Moist heat  PLAN FOR NEXT SESSION: HEP review, R hip strengthening    Lonni Pall PT, DPT Physical Therapist- [place]   [facility] 04/05/2024, 11:09 AM

## 2024-04-09 ENCOUNTER — Encounter: Payer: Self-pay | Admitting: Radiology

## 2024-04-10 ENCOUNTER — Ambulatory Visit: Payer: PRIVATE HEALTH INSURANCE

## 2024-04-12 ENCOUNTER — Ambulatory Visit: Payer: PRIVATE HEALTH INSURANCE | Attending: Orthopaedic Surgery

## 2024-04-12 DIAGNOSIS — M25551 Pain in right hip: Secondary | ICD-10-CM | POA: Diagnosis present

## 2024-04-12 DIAGNOSIS — M6281 Muscle weakness (generalized): Secondary | ICD-10-CM | POA: Diagnosis present

## 2024-04-12 DIAGNOSIS — R262 Difficulty in walking, not elsewhere classified: Secondary | ICD-10-CM | POA: Insufficient documentation

## 2024-04-12 NOTE — Therapy (Signed)
 OUTPATIENT PHYSICAL THERAPY LOWER EXTREMITY TREATMENT   Patient Name: Lori [name] MRN: [id number] DOB:1975-07-13, 48 y.o., female Today's Date: 04/12/2024  END OF SESSION:  PT End of Session - 04/12/24 0950     Visit Number 5    Number of Visits 25    Date for Recertification  06/05/24    PT Start Time 0950    PT Stop Time 1030    PT Time Calculation (min) 40 min    Activity Tolerance Patient tolerated treatment well    Behavior During Therapy [facility] for tasks assessed/performed              Past Medical History:  Diagnosis Date   Bacterial vaginitis    Cancer (HCC) 1987   Hodgkins Lymphoma   CHF (congestive heart failure) (HCC)    Colon polyps    Complication of anesthesia    nausea/vomiting   Depression    Diabetes mellitus without complication (HCC)    History of abnormal mammogram 2013   @[facility] - NEG MRI AND BX OCT 2014 NORMAL   History of mammogram 03/2015   BREAST MRI AT [facility] WNL   History of Papanicolaou smear of cervix 10/08/2013; 08/04/15   -/-; ASCUS/HPV NEG;   Hodgkin's disease (HCC) 1987   She had surgical resection of Lymph nodes and chemo + rad tx's.   Hypertension    Hypothyroidism    2ND TO CA TX   PONV (postoperative nausea and vomiting)    Restrictive lung disease    Skin cancer may 2025   place on back   Vitamin D  deficiency    Past Surgical History:  Procedure Laterality Date   APPENDECTOMY     CHOLECYSTECTOMY     DEEP NECK LYMPH NODE BIOPSY / EXCISION  1987   hodgkin lymphoma   DILATATION & CURETTAGE/HYSTEROSCOPY WITH MYOSURE N/A 03/10/2017   Procedure: DILATATION & CURETTAGE/HYSTEROSCOPY WITH MYOSURE;  Surgeon: Leonce Garnette BIRCH, MD;  Location: [facility];  Service: Gynecology;  Laterality: N/A;   INTRAUTERINE DEVICE (IUD) INSERTION  [id number]; 02/06/09; 12/12/13   INTRAUTERINE DEVICE (IUD) INSERTION N/A 03/10/2017   Procedure: INTRAUTERINE DEVICE (IUD) INSERTION;  Surgeon: Leonce Garnette BIRCH, MD;  Location: [facility];  Service: Gynecology;   Laterality: N/A;   SPLENECTOMY     THYROIDECTOMY     Patient Active Problem List   Diagnosis Date Noted   Restless legs 03/10/2023   Hypotension 05/13/2022   History of pyelonephritis 12/11/2021   AKI (acute kidney injury) 12/08/2021   Chronic headache disorder 12/08/2021   Lactic acidosis 12/08/2021   Sepsis with acute renal failure (HCC) 12/08/2021   Night sweats 10/12/2021   Depression, major, single episode, mild 10/12/2021   Chronic constipation 10/01/2021   Excessive use of nonsteroidal anti-inflammatory drugs (NSAIDs) 10/01/2021   Serrated adenoma of colon 10/01/2021   Leukocytosis 08/17/2021   GAD (generalized anxiety disorder) 07/06/2021   Insomnia 07/06/2021   Tear of right acetabular labrum 07/06/2021   Mixed incontinence urge and stress 10/27/2020   Trochanteric bursitis of right hip 06/13/2020   History of antineoplastic chemotherapy 07/18/2019   Hot flash, menopausal 10/18/2018   Toxicity of radiation, late effect 01/04/2018   History of tobacco use 09/25/2017   Endometrial mass 01/18/2017   DUB (dysfunctional uterine bleeding) 01/18/2017   Bacterial vaginosis 01/18/2017   Allergic rhinitis 06/03/2015   Anxiety 06/03/2015   Acute systolic heart failure (HCC) 06/03/2015   Clinical depression 06/03/2015   Diabetes mellitus, type 2 (HCC) 06/03/2015   Acid reflux  06/03/2015   History of biliary T-tube placement 06/03/2015   HD (Hodgkin's disease) (HCC) 06/03/2015   RAD (reactive airway disease) 06/03/2015   Fast heart beat 06/03/2015   Chronic systolic heart failure (HCC) 05/23/2014   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 Patient is unaware of any protocol from the MD. No formal precautions as far as the patient is aware.   PERTINENT HISTORY: S/p R hip arthroscopy with labral repair on 03/08/2024. PAIN:  Are you having pain? Yes: NPRS scale: 5/10  Pain location: R hip  Pain description: sore, aches Aggravating factors: walking Relieving factors: medication, rest   PRECAUTIONS: Other: Labral repair  RED FLAGS: None   WEIGHT BEARING RESTRICTIONS: Yes WBAT  FALLS:  Has patient fallen in last 6 months? No  LIVING ENVIRONMENT: Lives with: lives with their spouse Lives in: House/apartment Stairs: No  OCCUPATION: work from home   PLOF: Independent  PATIENT GOALS: to get R leg stronger   OBJECTIVE:  Note: Objective measures were completed at Evaluation unless otherwise noted.  DIAGNOSTIC FINDINGS: N/A   PATIENT SURVEYS:  LEFS  Extreme difficulty/unable (0), Quite a bit of difficulty (1), Moderate difficulty (2), Little difficulty (3), No difficulty (4) Survey date:    Any of your usual work, housework or school activities 2  2. Usual hobbies, recreational or sporting activities 2  3. Getting into/out of the bath 0  4. Walking between rooms 3  5. Putting on socks/shoes 3  6. Squatting  0  7. Lifting an object, like a bag of groceries from the floor 0  8. Performing light activities around your home 3  9. Performing heavy activities around your home 0  10. Getting into/out of a car 2  11. Walking 2 blocks 3  12. Walking 1 mile 0  13. Going up/down 10 stairs (1 flight) 0  14. Standing for 1 hour 0  15.  sitting for 1 hour 3  16. Running on even ground 0  17. Running on uneven ground 0  18. Making sharp turns while running fast 0  19.  Hopping  0  20. Rolling over in bed 2  Score total:  27/80 = 33.8%     COGNITION: Overall cognitive status: Within functional limits for tasks assessed     SENSATION: Slight numbness/tingling on distal quad   POSTURE: No Significant postural limitations  PALPATION: General soreness to palpation near incision   LOWER EXTREMITY ROM: will measure once clearance from MD   Active ROM Right eval Left eval  Hip flexion    Hip extension    Hip abduction    Hip adduction    Hip internal rotation    Hip external rotation    Knee flexion    Knee extension    Ankle dorsiflexion    Ankle plantarflexion    Ankle inversion    Ankle eversion     (Blank rows = not tested)  LOWER EXTREMITY MMT:  MMT Right eval Left eval  Hip  flexion  4  Hip extension    Hip abduction  4  Hip adduction    Hip internal rotation    Hip external rotation    Knee flexion  4+  Knee extension  4+  Ankle dorsiflexion  4+  Ankle plantarflexion    Ankle inversion    Ankle eversion     (Blank rows = not tested)   FUNCTIONAL TESTS:  5 times sit to stand: 22  seconds  6 minute walk test: to be completed visit #2   GAIT: Distance walked: 50' Assistive device utilized: None Level of assistance: Complete Independence Comments: decreased stance time on R, mild trendelenburg on R                                                                                                                            TREATMENT DATE: 04/12/24    Subjective: Patient reported moderate soreness following last PT session. Patient took ibuprofen  in order to reduce pain. No questions or concerns.    Therapeutic Activity:   Matrix Exercise Bike Level 6-2 x 6 min (Seat 11) for LE strength and endurance; PT manually adjusted resistance throughout per patient tolerance.   TRX Squat   2 x 10   TRX Lateral Lunges   2 x 10 ea leg   Therapeutic Exercise:  Seated Knee Extension   3 x 10 - 15#   Standing Hip Abduction    R/L: 3 x 10 - 25#   Sidelying Hip Abduction  R: 2 x 10 - 5# AW   Ball Squeeze with Supine Bridge   2 x 10     PATIENT EDUCATION:  Education details: Exercise technique, HEP.  Person educated: Patient Education method: Explanation, Demonstration, and Handouts Education comprehension: verbalized understanding and returned demonstration  HOME EXERCISE PROGRAM: Access Code: [email or web address] URL: [email or web address] Date: 04/05/2024 Prepared by: Lonni Pall  Exercises - Standing March with Counter Support  - 1-2 x daily - 5-7 x weekly - 3 sets - 10 reps - Heel Raises with Counter Support  - 1-2 x daily - 5-7 x weekly - 3 sets - 10 reps - Hip Extension with Resistance Loop  - 1 x daily - 5-7 x weekly - 2-3 sets - 10-12 reps - Standing Hip Abduction with Resistance at Ankles and Counter Support  - 1 x daily - 5-7 x weekly - 2-3 sets - 10-12 reps - Mini Squat  - 1 x daily - 5-7 x weekly - 2-3 sets - 10-12 reps - Supine Gluteus Stretch  - 1 x daily - 7 x weekly - 3 sets - 30-60s hold - Hip Flexor Stretch at Edge of Bed  - 1 x daily - 7 x weekly - 3 sets - 30-60s hold - Child's Pose Stretch  - 1 x daily - 7 x weekly - 3 sets - 30s-60 hold  Access Code: [email or web address] URL: [email or web address] Date: 03/13/2024 Prepared by: Maryanne Finder  Exercises - Mini  Squat with Counter Support  - 1-2 x daily - 5-7 x weekly - 3 sets - 10 reps - Standing Hip Abduction with Counter Support  - 1-2 x daily - 5-7 x weekly - 3 sets - 10 reps - Standing March with Counter Support  - 1-2 x daily - 5-7 x weekly - 3 sets - 10 reps - Heel Raises with Counter Support  - 1-2 x daily - 5-7 x weekly - 3 sets - 10 reps - Standing Hip Extension with Counter Support  - 1-2 x daily - 5-7 x weekly - 3 sets - 10 reps  ASSESSMENT:  CLINICAL IMPRESSION:    Continued PT POC focused on rehab after R hip labral repair. Continue focus on LE strengthening at hip and knee muscles. Good carryover from  last session with squatting technique, less cues from PT required. Tolerated all progressions of resistive exercises without excessive pain in the R hip. Based on today's performance, pt will benefit skilled physical therapy in order to improve hip pain and maximize return to PLOF.   OBJECTIVE IMPAIRMENTS: Abnormal gait, decreased activity tolerance, decreased balance, decreased endurance, difficulty walking, decreased ROM, decreased strength, impaired flexibility, impaired sensation, and pain.   ACTIVITY LIMITATIONS: lifting, sitting, standing, squatting, stairs, and locomotion level  PARTICIPATION LIMITATIONS: cleaning, laundry, driving, community activity, and yard work  PERSONAL FACTORS: Age, Past/current experiences, Profession, and Time since onset of injury/illness/exacerbation are also affecting patient's functional outcome.   REHAB POTENTIAL: Good  CLINICAL DECISION MAKING: Stable/uncomplicated  EVALUATION COMPLEXITY: Low   GOALS: Goals reviewed with patient? Yes  SHORT TERM GOALS: Target date: 04/24/2024  Patient will be independent in HEP to improve strength/mobility for better functional independence with ADLs. Baseline: Goal status: INITIAL  LONG TERM GOALS: Target date: 06/05/2024  Patient will increase LEFS by 9 points to demonstrate improved functional mobility and increased tolerance with ADLs.  Baseline: 03/13/2024: 27/80 Goal status: INITIAL  2.  Patient will increase BLE gross strength to 4+/5 as to improve functional strength for independent gait, increased standing tolerance and increased ADL ability. Baseline: 03/13/2024: will further assess strength once clearance from MD Goal status: INITIAL  3.  Patient will improve by 57m (164') in order to demonstrate clinically significant improvement in cardiopulmonary endurance and community ambulation Baseline: 03/13/2024: 1200'  Goal status: INITIAL  4.  Patient will improve 5xSTS time by 5 seconds to  demonstrate improved functional LE strength. Baseline: 03/13/2024: 22 seconds  Goal status: INITIAL  5.  Patient will be able to ambulate around her block without pain to demonstrate improved tolerance to activity and return to PLOF Baseline: 03/13/2024: unable to ambulate outdoors at this time Goal status: INITIAL  6.  Patient will report a worst pain of <3/10 on NRPS in R hip to improve tolerance with ADLs and reduced symptoms with activities. Baseline: 03/13/2024: on eval 5/10  Goal status: INITIAL   PLAN:  PT FREQUENCY: 1-2x/week  PT DURATION: 12 weeks  PLANNED INTERVENTIONS: 97164- PT Re-evaluation, 97750- Physical Performance Testing, 97110-Therapeutic exercises, 97530- Therapeutic activity, 97112- Neuromuscular re-education, 97535- Self Care, [id number]- Manual therapy, [phone]- Gait training, [phone]- Electrical stimulation (unattended), [phone]- Electrical stimulation (manual), [phone] (1-2 muscles), [phone] (3+ muscles)- Dry Needling, Patient/Family education, Balance training, Stair training, Joint mobilization, Joint manipulation, Cryotherapy, and Moist heat  PLAN FOR NEXT SESSION: HEP review, R hip strengthening    Lonni Pall PT, DPT Physical Therapist- [facility]   [facility] 04/12/2024, 9:51 AM

## 2024-04-17 ENCOUNTER — Ambulatory Visit: Payer: PRIVATE HEALTH INSURANCE

## 2024-04-19 ENCOUNTER — Ambulatory Visit: Payer: PRIVATE HEALTH INSURANCE

## 2024-04-19 DIAGNOSIS — M25551 Pain in right hip: Secondary | ICD-10-CM | POA: Diagnosis not present

## 2024-04-19 DIAGNOSIS — R262 Difficulty in walking, not elsewhere classified: Secondary | ICD-10-CM

## 2024-04-19 DIAGNOSIS — M6281 Muscle weakness (generalized): Secondary | ICD-10-CM

## 2024-04-19 NOTE — Therapy (Addendum)
 OUTPATIENT PHYSICAL THERAPY LOWER EXTREMITY TREATMENT   Patient Name: Lori [name] MRN: [id number] DOB:[birth year], 48 y.o., female Today's Date: 04/19/2024  END OF SESSION:  PT End of Session - 04/19/24 0947     Visit Number 6    Number of Visits 25    Date for Recertification  06/05/24    PT Start Time 0947    PT Stop Time 1030    PT Time Calculation (min) 43 min    Activity Tolerance Patient tolerated treatment well    Behavior During Therapy [facility] for tasks assessed/performed              Past Medical History:  Diagnosis Date   Bacterial vaginitis    Cancer (HCC) 1987   Hodgkins Lymphoma   CHF (congestive heart failure) (HCC)    Colon polyps    Complication of anesthesia    nausea/vomiting   Depression    Diabetes mellitus without complication (HCC)    History of abnormal mammogram 2013   @[facility] - NEG MRI AND BX OCT 2014 NORMAL   History of mammogram 03/2015   BREAST MRI AT [facility] WNL   History of Papanicolaou smear of cervix 10/08/2013; 08/04/15   -/-; ASCUS/HPV NEG;   Hodgkin's disease (HCC) 1987   She had surgical resection of Lymph nodes and chemo + rad tx's.   Hypertension    Hypothyroidism    2ND TO CA TX   PONV (postoperative nausea and vomiting)    Restrictive lung disease    Skin cancer may 2025   place on back   Vitamin D  deficiency    Past Surgical History:  Procedure Laterality Date   APPENDECTOMY     CHOLECYSTECTOMY     DEEP NECK LYMPH NODE BIOPSY / EXCISION  1987   hodgkin lymphoma   DILATATION & CURETTAGE/HYSTEROSCOPY WITH MYOSURE N/A 03/10/2017   Procedure: DILATATION & CURETTAGE/HYSTEROSCOPY WITH MYOSURE;  Surgeon: Leonce Garnette BIRCH, MD;  Location: [facility] ORS;  Service: Gynecology;  Laterality: N/A;   INTRAUTERINE DEVICE (IUD) INSERTION  [id number]; 02/06/09; 12/12/13   INTRAUTERINE DEVICE (IUD) INSERTION N/A 03/10/2017   Procedure: INTRAUTERINE DEVICE (IUD) INSERTION;  Surgeon: Leonce Garnette BIRCH, MD;  Location: [facility] ORS;  Service: Gynecology;   Laterality: N/A;   SPLENECTOMY     THYROIDECTOMY     Patient Active Problem List   Diagnosis Date Noted   Restless legs 03/10/2023   Hypotension 05/13/2022   History of pyelonephritis 12/11/2021   AKI (acute kidney injury) 12/08/2021   Chronic headache disorder 12/08/2021   Lactic acidosis 12/08/2021   Sepsis with acute renal failure (HCC) 12/08/2021   Night sweats 10/12/2021   Depression, major, single episode, mild 10/12/2021   Chronic constipation 10/01/2021   Excessive use of nonsteroidal anti-inflammatory drugs (NSAIDs) 10/01/2021   Serrated adenoma of colon 10/01/2021   Leukocytosis 08/17/2021   GAD (generalized anxiety disorder) 07/06/2021   Insomnia 07/06/2021   Tear of right acetabular labrum 07/06/2021   Mixed incontinence urge and stress 10/27/2020   Trochanteric bursitis of right hip 06/13/2020   History of antineoplastic chemotherapy 07/18/2019   Hot flash, menopausal 10/18/2018   Toxicity of radiation, late effect 01/04/2018   History of tobacco use 09/25/2017   Endometrial mass 01/18/2017   DUB (dysfunctional uterine bleeding) 01/18/2017   Bacterial vaginosis 01/18/2017   Allergic rhinitis 06/03/2015   Anxiety 06/03/2015   Acute systolic heart failure (HCC) 06/03/2015   Clinical depression 06/03/2015   Diabetes mellitus, type 2 (HCC) 06/03/2015   Acid reflux  06/03/2015   History of biliary T-tube placement 06/03/2015   HD (Hodgkin's disease) (HCC) 06/03/2015   RAD (reactive airway disease) 06/03/2015   Fast heart beat 06/03/2015   Chronic systolic heart failure (HCC) 05/23/2014   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 Patient is unaware of any protocol from the MD. No formal precautions as far as the patient is aware.   PERTINENT HISTORY: S/p R hip arthroscopy with labral repair on 03/08/2024. PAIN:  Are you having pain? Yes: NPRS scale: 5/10  Pain location: R hip  Pain description: sore, aches Aggravating factors: walking Relieving factors: medication, rest   PRECAUTIONS: Other: Labral repair  RED FLAGS: None   WEIGHT BEARING RESTRICTIONS: Yes WBAT  FALLS:  Has patient fallen in last 6 months? No  LIVING ENVIRONMENT: Lives with: lives with their spouse Lives in: House/apartment Stairs: No  OCCUPATION: work from home   PLOF: Independent  PATIENT GOALS: to get R leg stronger   OBJECTIVE:  Note: Objective measures were completed at Evaluation unless otherwise noted.  DIAGNOSTIC FINDINGS: N/A   PATIENT SURVEYS:  LEFS  Extreme difficulty/unable (0), Quite a bit of difficulty (1), Moderate difficulty (2), Little difficulty (3), No difficulty (4) Survey date:    Any of your usual work, housework or school activities 2  2. Usual hobbies, recreational or sporting activities 2  3. Getting into/out of the bath 0  4. Walking between rooms 3  5. Putting on socks/shoes 3  6. Squatting  0  7. Lifting an object, like a bag of groceries from the floor 0  8. Performing light activities around your home 3  9. Performing heavy activities around your home 0  10. Getting into/out of a car 2  11. Walking 2 blocks 3  12. Walking 1 mile 0  13. Going up/down 10 stairs (1 flight) 0  14. Standing for 1 hour 0  15.  sitting for 1 hour 3  16. Running on even ground 0  17. Running on uneven ground 0  18. Making sharp turns while running fast 0  19. Hopping  0  20.  Rolling over in bed 2  Score total:  27/80 = 33.8%     COGNITION: Overall cognitive status: Within functional limits for tasks assessed     SENSATION: Slight numbness/tingling on distal quad   POSTURE: No Significant postural limitations  PALPATION: General soreness to palpation near incision   LOWER EXTREMITY ROM: will measure once clearance from MD   Active ROM Right eval Left eval  Hip flexion    Hip extension    Hip abduction    Hip adduction    Hip internal rotation    Hip external rotation    Knee flexion    Knee extension    Ankle dorsiflexion    Ankle plantarflexion    Ankle inversion    Ankle eversion     (Blank rows = not tested)  LOWER EXTREMITY MMT:  MMT Right eval Left eval  Hip  flexion  4  Hip extension    Hip abduction  4  Hip adduction    Hip internal rotation    Hip external rotation    Knee flexion  4+  Knee extension  4+  Ankle dorsiflexion  4+  Ankle plantarflexion    Ankle inversion    Ankle eversion     (Blank rows = not tested)   FUNCTIONAL TESTS:  5 times sit to stand: 22  seconds  6 minute walk test: to be completed visit #2   GAIT: Distance walked: 28' Assistive device utilized: None Level of assistance: Complete Independence Comments: decreased stance time on R, mild trendelenburg on R                                                                                                                            TREATMENT DATE: 04/19/24    Subjective: Patient reports that she has intermittent discomfort in the R hip that sometimes radiates into the R glute. The patient reports that her hip feels fine and she can functionally do things without difficulty. No questions or concerns.    Therapeutic Activity:   Matrix Exercise Bike Level 6-2 x 6 min (Seat 11) for LE strength and endurance; PT manually adjusted resistance throughout per patient tolerance.   TRX Squat   2 x 10 - Good carry over from last session  Kettlebell  Squat   2 x 10  - 20#   Lateral Stepping against resistance   2 x 15' - Blue TB (Standing Break)  2 x 15' - Blue TB   2 x 15'  Therapeutic Exercise:   Omega Cable    Leg Press     BLE: 1 x 10 - 35#     RLE: 2 x 10 - 20#   Sidelying Clamshell    R: 3 x 10 - Blue TB    Lock Clamshell for glute medius strengthening    3 x 10    Hip Extension - Hip Matrix    R/L: 1 x 10 - 30#, 2 x 10 - 40#     Glute Cross Over Stretch    R: 30s/bout x 2 in order to improve tissue extensibility   Piriformis Stretch    R: 30s/bout x 2 in order to improve tissue extensibility  PATIENT EDUCATION:  Education details: Exercise technique, HEP.  Person educated: Patient Education method: Explanation, Demonstration, and Handouts Education comprehension: verbalized understanding and returned demonstration  HOME EXERCISE PROGRAM: Access Code: [email or web address] URL: [email or web address] Date: 04/05/2024 Prepared by: Lonni Pall  Exercises - Standing March with Counter Support  - 1-2 x daily - 5-7 x weekly - 3 sets - 10 reps - Heel Raises with Counter Support  - 1-2 x daily - 5-7 x weekly - 3 sets - 10 reps - Hip Extension with Resistance Loop  - 1 x daily - 5-7 x weekly - 2-3 sets - 10-12 reps - Standing Hip Abduction  with Resistance at Ankles and Counter Support  - 1 x daily - 5-7 x weekly - 2-3 sets - 10-12 reps - Mini Squat  - 1 x daily - 5-7 x weekly - 2-3 sets - 10-12 reps - Supine Gluteus Stretch  - 1 x daily - 7 x weekly - 3 sets - 30-60s hold - Hip Flexor Stretch at Edge of Bed  - 1 x daily - 7 x weekly - 3 sets - 30-60s hold - Child's Pose Stretch  - 1 x daily - 7 x weekly - 3 sets - 30s-60 hold  Access Code: [email or web address] URL: [email or web address] Date: 03/13/2024 Prepared by: Maryanne Finder  Exercises - Mini Squat with Counter Support  - 1-2 x daily - 5-7 x weekly - 3 sets - 10 reps - Standing Hip Abduction with Counter Support  - 1-2 x daily - 5-7 x weekly - 3  sets - 10 reps - Standing March with Counter Support  - 1-2 x daily - 5-7 x weekly - 3 sets - 10 reps - Heel Raises with Counter Support  - 1-2 x daily - 5-7 x weekly - 3 sets - 10 reps - Standing Hip Extension with Counter Support  - 1-2 x daily - 5-7 x weekly - 3 sets - 10 reps  ASSESSMENT:  CLINICAL IMPRESSION:    Continued PT POC in management of s/p R hip labral repair. PT session primarily focused on global hip strengthening. Reported pain concordant with piriformis stretch and clamshell exercises but improved with repetitions. PT educated patient on proper supine piriformis stretch and clamshell against resistance in order to strengthen muscle group.  Tolerated all progressions of resistive exercises without excessive pain in the R hip; pt able to perform weighted squats using kettlebell. Based on today's performance, pt will benefit skilled physical therapy in order to improve hip pain and maximize return to PLOF.      OBJECTIVE IMPAIRMENTS: Abnormal gait, decreased activity tolerance, decreased balance, decreased endurance, difficulty walking, decreased ROM, decreased strength, impaired flexibility, impaired sensation, and pain.   ACTIVITY LIMITATIONS: lifting, sitting, standing, squatting, stairs, and locomotion level  PARTICIPATION LIMITATIONS: cleaning, laundry, driving, community activity, and yard work  PERSONAL FACTORS: Age, Past/current experiences, Profession, and Time since onset of injury/illness/exacerbation are also affecting patient's functional outcome.   REHAB POTENTIAL: Good  CLINICAL DECISION MAKING: Stable/uncomplicated  EVALUATION COMPLEXITY: Low   GOALS: Goals reviewed with patient? Yes  SHORT TERM GOALS: Target date: 04/24/2024  Patient will be independent in HEP to improve strength/mobility for better functional independence with ADLs. Baseline: Goal status: INITIAL  LONG TERM GOALS: Target date: 06/05/2024  Patient will increase LEFS by 9 points  to demonstrate improved functional mobility and increased tolerance with ADLs.  Baseline: 03/13/2024: 27/80 Goal status: INITIAL  2.  Patient will increase BLE gross strength to 4+/5 as to improve functional strength for independent gait, increased standing tolerance and increased ADL ability. Baseline: 03/13/2024: will further assess strength once clearance from MD Goal status: INITIAL  3.  Patient will improve by 73m (164') in order to demonstrate clinically significant improvement in cardiopulmonary endurance and community ambulation Baseline: 03/13/2024: 1200'  Goal status: INITIAL  4.  Patient will improve 5xSTS time by 5 seconds to demonstrate improved functional LE strength. Baseline: 03/13/2024: 22 seconds  Goal status: INITIAL  5.  Patient will be able to ambulate around her block without pain to demonstrate improved tolerance to activity and return to PLOF Baseline: 03/13/2024: unable to ambulate outdoors  at this time Goal status: INITIAL  6.  Patient will report a worst pain of <3/10 on NRPS in R hip to improve tolerance with ADLs and reduced symptoms with activities. Baseline: 03/13/2024: on eval 5/10  Goal status: INITIAL   PLAN:  PT FREQUENCY: 1-2x/week  PT DURATION: 12 weeks  PLANNED INTERVENTIONS: 97164- PT Re-evaluation, 97750- Physical Performance Testing, 97110-Therapeutic exercises, 97530- Therapeutic activity, 97112- Neuromuscular re-education, 97535- Self Care, 02859- Manual therapy, [phone]- Gait training, [phone]- Electrical stimulation (unattended), [phone]- Electrical stimulation (manual), [phone] (1-2 muscles), [phone] (3+ muscles)- Dry Needling, Patient/Family education, Balance training, Stair training, Joint mobilization, Joint manipulation, Cryotherapy, and Moist heat  PLAN FOR NEXT SESSION: HEP review, R hip strengthening    Lonni Pall PT, DPT Physical Therapist- [place]   [facility] 04/19/2024, 9:51 AM  Note: Per the  evaluating physical therapist's plan of care, if patient does not return for follow up visit(s) related to this episode of care, this note will serve as their discharge note from physical therapy.

## 2024-04-23 ENCOUNTER — Encounter: Payer: Self-pay | Admitting: Family Medicine

## 2024-04-23 DIAGNOSIS — K219 Gastro-esophageal reflux disease without esophagitis: Secondary | ICD-10-CM

## 2024-04-23 MED ORDER — PANTOPRAZOLE SODIUM 40 MG PO TBEC: 40.0000 mg | DELAYED_RELEASE_TABLET | Freq: Every day | ORAL | 1 refills | Status: AC

## 2024-04-24 ENCOUNTER — Ambulatory Visit: Payer: PRIVATE HEALTH INSURANCE

## 2024-04-26 ENCOUNTER — Ambulatory Visit: Payer: PRIVATE HEALTH INSURANCE | Admitting: Physician Assistant

## 2024-04-26 ENCOUNTER — Encounter: Payer: Self-pay | Admitting: Physician Assistant

## 2024-04-26 ENCOUNTER — Ambulatory Visit: Payer: PRIVATE HEALTH INSURANCE

## 2024-04-26 ENCOUNTER — Encounter: Payer: Self-pay | Admitting: Family Medicine

## 2024-04-26 VITALS — BP 96/72 | HR 84 | Resp 16 | Ht 66.0 in | Wt 156.0 lb

## 2024-04-26 DIAGNOSIS — L03213 Periorbital cellulitis: Secondary | ICD-10-CM | POA: Diagnosis not present

## 2024-04-26 DIAGNOSIS — J3089 Other allergic rhinitis: Secondary | ICD-10-CM | POA: Diagnosis not present

## 2024-04-26 DIAGNOSIS — H1032 Unspecified acute conjunctivitis, left eye: Secondary | ICD-10-CM

## 2024-04-26 MED ORDER — AMOXICILLIN-POT CLAVULANATE 875-125 MG PO TABS: 1.0000 | ORAL_TABLET | Freq: Two times a day (BID) | ORAL | 0 refills | Status: DC

## 2024-04-26 MED ORDER — OFLOXACIN 0.3 % OP SOLN: 1.0000 [drp] | Freq: Four times a day (QID) | OPHTHALMIC | 0 refills | Status: DC

## 2024-04-27 ENCOUNTER — Ambulatory Visit (HOSPITAL_BASED_OUTPATIENT_CLINIC_OR_DEPARTMENT_OTHER): Payer: PRIVATE HEALTH INSURANCE | Admitting: Orthopaedic Surgery

## 2024-04-27 DIAGNOSIS — S73191A Other sprain of right hip, initial encounter: Secondary | ICD-10-CM

## 2024-04-27 NOTE — Progress Notes (Signed)
 Established patient visit  Patient: Lori [name]   DOB: [birth year]   48 y.o. Female  MRN: [id number] Visit Date: 04/26/2024  Today's healthcare provider: Jolynn Spencer, PA-C   Chief Complaint  Patient presents with   Conjunctivitis    Left eye   Subjective      Discussed the use of AI scribe software for clinical note transcription with the patient, who gave verbal consent to proceed.  History of Present Illness Lori [name] is a 48 year old female who presents with left eye pain and swelling.  She has experienced pain and swelling in her left eye for the past couple of days, with soreness especially when blinking or touching the area. The eye is itchy and crusted with discharge upon waking for the past two mornings. No fever is present.  She denies recent viral infections in herself or her family, although she cares for her grandchildren during the week. She describes the area under her eye as significantly affected. She has avoided wearing makeup and using soap on the eye, opting for warm water instead. She denies any previous similar eye issues.       04/26/2024    4:10 PM 11/22/2023   11:22 AM 11/09/2023    3:57 PM  Depression screen PHQ 2/9  Decreased Interest 0 0 0  Down, Depressed, Hopeless 0 0 0  PHQ - 2 Score 0 0 0  Altered sleeping 0  3  Tired, decreased energy 0  3  Change in appetite 0  0  Feeling bad or failure about yourself  0  0  Trouble concentrating 0  0  Moving slowly or fidgety/restless   0  Suicidal thoughts 0  0  PHQ-9 Score 0  6   Difficult doing work/chores Not difficult at all  Not difficult at all     Data saved with a previous flowsheet row definition      11/09/2023    3:57 PM 09/22/2023    8:10 AM 04/11/2023    8:39 AM 03/10/2023   11:01 AM  GAD 7 : Generalized Anxiety Score  Nervous, Anxious, on Edge 0 0 1 2  Control/stop worrying 0 0 1 2  Worry too much - different things 1 0 1 2  Trouble relaxing 0 0 1 2  Restless 1 0 1 2  Easily  annoyed or irritable 1 0 2 3  Afraid - awful might happen 0 0 1 2  Total GAD 7 Score 3 0 8 15  Anxiety Difficulty Not difficult at all Not difficult at all Somewhat difficult     Medications: Outpatient Medications Prior to Visit  Medication Sig   ALPRAZolam  (XANAX ) 0.5 MG tablet TAKE 1 TAB AT BEDTIME AS NEEDED FOR ANXIETY AND 1/2 TAB DURING THE DAY FOR PANIC ATTACKS. DO NOT TAKE WITH MUSCLE RELAXANTS   Atogepant 60 MG TABS Take 60 mg by mouth.   atorvastatin (LIPITOR) 40 MG tablet Take 1 tablet by mouth daily.   BD PEN NEEDLE NANO 2ND GEN 32G X 4 MM MISC SMARTSIG:1 Each Daily   Continuous Blood Gluc Transmit (DEXCOM G6 TRANSMITTER) MISC USE TO MONITOR BLOOD SUGARS. CHANGE EVERY 90 DAYS.   Continuous Glucose Sensor (DEXCOM G6 SENSOR) MISC SMARTSIG:1 EACH TOPICAL EVERY 10 DAYS   gabapentin  (NEURONTIN ) 100 MG capsule TAKE 1 CAPSULE BY MOUTH AT BEDTIME.   glimepiride (AMARYL) 4 MG tablet Take 2 mg by mouth.   Insulin  Glargine (BASAGLAR KWIKPEN) 100 UNIT/ML Start 10 units once a  day.  Titrate by 1 unit daily until fasting blood sugar under 125.  Max daily dose 50 units.   JANUVIA 100 MG tablet Take 100 mg by mouth daily.   levonorgestrel  (MIRENA ) 20 MCG/24HR IUD 1 each by Intrauterine route once.   levothyroxine (SYNTHROID) 75 MCG tablet Take 75 mcg by mouth daily.   lisinopril (ZESTRIL) 5 MG tablet Take by mouth.   metFORMIN (GLUCOPHAGE-XR) 500 MG 24 hr tablet 1 tablet with evening meal Orally Twice a day; Duration: 30 day(s)   metoprolol  succinate (TOPROL -XL) 50 MG 24 hr tablet TAKE 1 TABLET BY MOUTH EVERY DAY   pantoprazole  (PROTONIX ) 40 MG tablet Take 1 tablet (40 mg total) by mouth daily.   sertraline  (ZOLOFT ) 100 MG tablet TAKE 1 TABLET BY MOUTH EVERY DAY   SYNJARDY XR 12.10-998 MG TB24 Take 2 tablets by mouth every morning.   aspirin  EC 325 MG tablet Take 1 tablet (325 mg total) by mouth daily.   insulin  aspart protamine - aspart (NOVOLOG MIX 70/30 FLEXPEN) (70-30) 100 UNIT/ML FlexPen  INJECT 55 UNITS ONCE DAILY IN THE MORNING, AND 50 UNITS IN THE EVENING. TAKE BEFORE MEALS; Duration: 28   ondansetron  (ZOFRAN -ODT) 4 MG disintegrating tablet Take 4 mg by mouth every 8 (eight) hours as needed.   oxyCODONE  (ROXICODONE ) 5 MG immediate release tablet Take 1 tablet (5 mg total) by mouth every 4 (four) hours as needed for severe pain (pain score 7-10) or breakthrough pain.   saxagliptin HCl (ONGLYZA) 5 MG TABS tablet Take 5 mg by mouth daily.   No facility-administered medications prior to visit.    Review of Systems All negative Except see HPI       Objective    BP 96/72 (BP Location: Right Arm, Patient Position: Sitting, Cuff Size: Normal)   Pulse 84   Resp 16   Ht 5' 6 (1.676 m)   Wt 156 lb (70.8 kg)   SpO2 99%   BMI 25.18 kg/m     Physical Exam Vitals reviewed.  Constitutional:      Appearance: She is normal weight.  HENT:     Head: Normocephalic and atraumatic.     Right Ear: Ear canal and external ear normal.     Left Ear: Ear canal and external ear normal.     Nose: Congestion and rhinorrhea present.     Mouth/Throat:     Pharynx: No posterior oropharyngeal erythema.     Comments: Postnasal drainage noted Eyes:     General: No scleral icterus.       Right eye: No discharge.        Left eye: No discharge.     Extraocular Movements: Extraocular movements intact.     Pupils: Pupils are equal, round, and reactive to light.  Cardiovascular:     Rate and Rhythm: Normal rate and regular rhythm.  Pulmonary:     Effort: Pulmonary effort is normal.     Breath sounds: Normal breath sounds.  Abdominal:     General: Abdomen is flat. Bowel sounds are normal.     Palpations: Abdomen is soft.  Lymphadenopathy:     Cervical: No cervical adenopathy.  Neurological:     Mental Status: She is alert.      Results for orders placed or performed in visit on 04/26/24  VITAMIN D  25 Hydroxy (Vit-D Deficiency, Fractures)  Result Value Ref Range   Vit D,  25-Hydroxy 19.8   Microalbumin / creatinine urine ratio  Result Value Ref Range  Microalb Creat Ratio <19.7   Protein / creatinine ratio, urine  Result Value Ref Range   Creatinine, Urine 35.6    Albumin, U <7   Hemoglobin A1c  Result Value Ref Range   Hemoglobin A1C 7.3         Assessment & Plan Left periorbital cellulitis/Conjunctivitis Acute left periorbital cellulitis with pain, redness, swelling, and crusting without proptosis, ophthalmoplegia, or pain with eye movement. Differential includes viral infection and bacterial cellulitis. Antibiotics prescribed for bacterial infection. - Prescribed antibiotic eye drops for both eyes. - Advised against using mascara or makeup on the affected eye. - Recommended warm compresses with tea bags. - Instructed to clean the eye from the outside angle to the inside angle using a tea bag. - Scheduled follow-up in 7-10 days.  Allergic rhinitis Nasal dryness and drainage. Discussed antihistamines and nasal saline spray for symptom management. - Recommended purchasing Flonase over the counter. - Suggested using over-the-counter antihistamines such as Allegra, Claritin, or Zyrtec. - Advised using nasal saline spray. RTC if symptoms worsen  Periorbital cellulitis of left eye (Primary)  - amoxicillin -clavulanate (AUGMENTIN ) 875-125 MG tablet; Take 1 tablet by mouth 2 (two) times daily.  Dispense: 20 tablet; Refill: 0 - ofloxacin  (OCUFLOX ) 0.3 % ophthalmic solution; Place 1 drop into the left eye 4 (four) times daily.  Dispense: 5 mL; Refill: 0  No orders of the defined types were placed in this encounter.   No follow-ups on file.   The patient was advised to call back or seek an in-person evaluation if the symptoms worsen or if the condition fails to improve as anticipated.  I discussed the assessment and treatment plan with the patient. The patient was provided an opportunity to ask questions and all were answered. The patient agreed with  the plan and demonstrated an understanding of the instructions.  I, Shanel Prazak, PA-C have reviewed all documentation for this visit. The documentation on 04/26/2024  for the exam, diagnosis, procedures, and orders are all accurate and complete.  Jolynn Spencer, [facility], MMS [facility] [phone] (phone) [phone] (fax)  [facility]

## 2024-04-27 NOTE — Progress Notes (Signed)
 Post Operative Evaluation    Procedure/Date of Surgery: Right hip labral repair 10/12  Interval History:   Presents 6 weeks status post above procedure.  Overall she is doing extremely well.  Denies any pain only mild gluteus symptoms   PMH/PSH/Family History/Social History/Meds/Allergies:    Past Medical History:  Diagnosis Date   Bacterial vaginitis    Cancer (HCC) 1987   Hodgkins Lymphoma   CHF (congestive heart failure) (HCC)    Colon polyps    Complication of anesthesia    nausea/vomiting   Depression    Diabetes mellitus without complication (HCC)    History of abnormal mammogram 2013   @[facility] - NEG MRI AND BX OCT 2014 NORMAL   History of mammogram 03/2015   BREAST MRI AT [facility] WNL   History of Papanicolaou smear of cervix 10/08/2013; 08/04/15   -/-; ASCUS/HPV NEG;   Hodgkin's disease (HCC) 1987   She had surgical resection of Lymph nodes and chemo + rad tx's.   Hypertension    Hypothyroidism    2ND TO CA TX   PONV (postoperative nausea and vomiting)    Restrictive lung disease    Skin cancer may 2025   place on back   Vitamin D  deficiency    Past Surgical History:  Procedure Laterality Date   APPENDECTOMY     CHOLECYSTECTOMY     DEEP NECK LYMPH NODE BIOPSY / EXCISION  1987   hodgkin lymphoma   DILATATION & CURETTAGE/HYSTEROSCOPY WITH MYOSURE N/A 03/10/2017   Procedure: DILATATION & CURETTAGE/HYSTEROSCOPY WITH MYOSURE;  Surgeon: Leonce Garnette BIRCH, MD;  Location: [facility];  Service: Gynecology;  Laterality: N/A;   INTRAUTERINE DEVICE (IUD) INSERTION  [id number]; 02/06/09; 12/12/13   INTRAUTERINE DEVICE (IUD) INSERTION N/A 03/10/2017   Procedure: INTRAUTERINE DEVICE (IUD) INSERTION;  Surgeon: Leonce Garnette BIRCH, MD;  Location: [facility];  Service: Gynecology;  Laterality: N/A;   SPLENECTOMY     THYROIDECTOMY     Social History   Socioeconomic History   Marital status: Married    Spouse name: Not on file   Number of children:  Not on file   Years of education: 12   Highest education level: GED or equivalent  Occupational History   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    Comment: Mirena   Other Topics Concern   Not on file  Social History Narrative   Not on file   Social Drivers of Health   Financial Resource Strain: Low Risk  (11/22/2023)   Overall Financial Resource Strain (CARDIA)    Difficulty of Paying Living Expenses: Not hard at all  Food Insecurity: No Food Insecurity (11/22/2023)  Hunger Vital Sign    Worried About Running Out of Food in the Last Year: Never true    Ran Out of Food in the Last Year: Never true  Transportation Needs: No Transportation Needs (11/22/2023)   PRAPARE - Administrator, Civil Service (Medical): No    Lack of Transportation (Non-Medical): No  Physical Activity: Insufficiently Active (03/08/2023)   Exercise Vital Sign    Days of Exercise per Week: 1 day    Minutes of Exercise per Session: 10 min  Stress: No Stress Concern Present (11/09/2023)   [employer] of Occupational Health - Occupational Stress Questionnaire    Feeling of Stress : Not at all  Social Connections: Socially Integrated (03/08/2023)   Social Connection and Isolation Panel    Frequency of Communication with Friends and Family: More than three times a week    Frequency of Social Gatherings with Friends and Family: Three times a week     Attends Religious Services: More than 4 times per year    Active Member of Clubs or Organizations: Yes    Attends Engineer, Structural: More than 4 times per year    Marital Status: Married   Family History  Problem Relation Age of Onset   Hypertension Mother    Other Maternal Grandfather        bile duct cancer   Colon cancer Paternal Grandmother        87-70   Allergies  Allergen Reactions   Dapagliflozin Other (See Comments)    (Farxiga) UTI   Liraglutide Nausea Only and Nausea And Vomiting    Victoza Victoza   Current Outpatient Medications  Medication Sig Dispense Refill   ALPRAZolam  (XANAX ) 0.5 MG tablet TAKE 1 TAB AT BEDTIME AS NEEDED FOR ANXIETY AND 1/2 TAB DURING THE DAY FOR PANIC ATTACKS. DO NOT TAKE WITH MUSCLE RELAXANTS 30 tablet 0   amoxicillin -clavulanate (AUGMENTIN ) 875-125 MG tablet Take 1 tablet by mouth 2 (two) times daily. 20 tablet 0   Atogepant 60 MG TABS Take 60 mg by mouth.     atorvastatin (LIPITOR) 40 MG tablet Take 1 tablet by mouth daily.     BD PEN NEEDLE NANO 2ND GEN 32G X 4 MM MISC SMARTSIG:1 Each Daily     Continuous Blood Gluc Transmit (DEXCOM G6 TRANSMITTER) MISC USE TO MONITOR BLOOD SUGARS. CHANGE EVERY 90 DAYS.     Continuous Glucose Sensor (DEXCOM G6 SENSOR) MISC SMARTSIG:1 EACH TOPICAL EVERY 10 DAYS 9 each 0   gabapentin  (NEURONTIN ) 100 MG capsule TAKE 1 CAPSULE BY MOUTH AT BEDTIME. 90 capsule 2   glimepiride (AMARYL) 4 MG tablet Take 2 mg by mouth.     insulin  aspart protamine - aspart (NOVOLOG MIX 70/30 FLEXPEN) (70-30) 100 UNIT/ML FlexPen INJECT 55 UNITS ONCE DAILY IN THE MORNING, AND 50 UNITS IN THE EVENING. TAKE BEFORE MEALS; Duration: 28     Insulin  Glargine (BASAGLAR KWIKPEN) 100 UNIT/ML Start 10 units once a day.  Titrate by 1 unit daily until fasting blood sugar under 125.  Max daily dose 50 units.     JANUVIA 100 MG tablet Take 100 mg by mouth daily.     levonorgestrel  (MIRENA ) 20 MCG/24HR IUD 1 each by Intrauterine route  once.     levothyroxine (SYNTHROID) 75 MCG tablet Take 75 mcg by mouth daily.     lisinopril (ZESTRIL) 5 MG tablet Take by mouth.     metFORMIN (GLUCOPHAGE-XR) 500 MG 24 hr tablet 1 tablet with evening meal  Orally Twice a day; Duration: 30 day(s)     metoprolol  succinate (TOPROL -XL) 50 MG 24 hr tablet TAKE 1 TABLET BY MOUTH EVERY DAY 90 tablet 1   ofloxacin  (OCUFLOX ) 0.3 % ophthalmic solution Place 1 drop into the left eye 4 (four) times daily. 5 mL 0   pantoprazole  (PROTONIX ) 40 MG tablet Take 1 tablet (40 mg total) by mouth daily. 90 tablet 1   saxagliptin HCl (ONGLYZA) 5 MG TABS tablet Take 5 mg by mouth daily.     sertraline  (ZOLOFT ) 100 MG tablet TAKE 1 TABLET BY MOUTH EVERY DAY 90 tablet 0   SYNJARDY XR 12.10-998 MG TB24 Take 2 tablets by mouth every morning.     No current facility-administered medications for this visit.   No results found.  Review of Systems:   A ROS was performed including pertinent positives and negatives as documented in the HPI.   Musculoskeletal Exam:    There were no vitals taken for this visit.  Right hip incisions are well-appearing without erythema or drainage.  30 degrees internal/external rotation of the right hip without pain.  Good abduction strength is neurosensory exams intact  Imaging:      I personally reviewed and interpreted the radiographs.   Assessment:   6 weeks status post right hip arthroscopic labral repair at this time she is doing extremely well I will discontinue physical therapy.  At this time she is doing extremely well.  I will plan to see her back as needed  Plan :    - Return to clinic as needed      I personally saw and evaluated the patient, and participated in the management and treatment plan.  Elspeth Parker, MD Attending Physician, Orthopedic Surgery  This document was dictated using Dragon voice recognition software. A reasonable attempt at proof reading has been made to minimize errors.

## 2024-05-01 ENCOUNTER — Ambulatory Visit: Payer: PRIVATE HEALTH INSURANCE

## 2024-05-01 ENCOUNTER — Encounter (HOSPITAL_BASED_OUTPATIENT_CLINIC_OR_DEPARTMENT_OTHER): Payer: Self-pay | Admitting: Orthopaedic Surgery

## 2024-05-01 ENCOUNTER — Other Ambulatory Visit: Payer: Self-pay | Admitting: Orthopaedic Surgery

## 2024-05-01 MED ORDER — METHYLPREDNISOLONE 4 MG PO TBPK: ORAL_TABLET | ORAL | 0 refills | Status: DC

## 2024-05-21 ENCOUNTER — Ambulatory Visit: Payer: Self-pay

## 2024-05-21 ENCOUNTER — Ambulatory Visit: Payer: PRIVATE HEALTH INSURANCE | Admitting: Physician Assistant

## 2024-05-21 ENCOUNTER — Encounter: Payer: Self-pay | Admitting: Physician Assistant

## 2024-05-21 VITALS — BP 99/61 | HR 81 | Resp 14 | Ht 66.0 in | Wt 157.9 lb

## 2024-05-21 DIAGNOSIS — J0110 Acute frontal sinusitis, unspecified: Secondary | ICD-10-CM | POA: Insufficient documentation

## 2024-05-21 DIAGNOSIS — J3089 Other allergic rhinitis: Secondary | ICD-10-CM

## 2024-05-21 DIAGNOSIS — E119 Type 2 diabetes mellitus without complications: Secondary | ICD-10-CM

## 2024-05-21 DIAGNOSIS — J069 Acute upper respiratory infection, unspecified: Secondary | ICD-10-CM | POA: Diagnosis not present

## 2024-05-21 DIAGNOSIS — R3915 Urgency of urination: Secondary | ICD-10-CM | POA: Insufficient documentation

## 2024-05-21 LAB — POCT URINALYSIS DIPSTICK
Bilirubin, UA: NEGATIVE
Blood, UA: NEGATIVE
Glucose, UA: POSITIVE — AB
Ketones, UA: NEGATIVE
Leukocytes, UA: NEGATIVE
Nitrite, UA: NEGATIVE
Protein, UA: NEGATIVE
Spec Grav, UA: 1.01 (ref 1.010–1.025)
Urobilinogen, UA: 0.2 U/dL
pH, UA: 6 (ref 5.0–8.0)

## 2024-05-21 NOTE — Progress Notes (Addendum)
 " Established patient visit  Patient: Lori [name]   DOB: 10-Sep-1975   48 y.o. Female  MRN: [id number] Visit Date: 05/21/2024  Today's healthcare provider: Jolynn Spencer, PA-C   Chief Complaint  Patient presents with   Acute Visit    Urgency with pain when urinating, frequency x 3-4 days otc: motrin  No appetite, nausea, headache unsure if related to symptoms   Subjective     HPI     Acute Visit    Additional comments: Urgency with pain when urinating, frequency x 3-4 days otc: motrin  No appetite, nausea, headache unsure if related to symptoms      Last edited by Wilfred Hargis RAMAN, CMA on 05/21/2024  9:49 AM.       Discussed the use of AI scribe software for clinical note transcription with the patient, who gave verbal consent to proceed.  History of Present Illness Lori [name] is a 48 year old female who presents with symptoms of a urinary tract infection.  She has urinary urgency, frequency, and dysuria, but an in-office dipstick was normal.  Over the weekend she developed anorexia, nausea without vomiting, fatigue, and a persistent vertex headache radiating into her neck, which is milder today. She denies fever and diarrhea.  She notes mucus in her throat and post-nasal drainage without sore throat or cough. She uses Flonase but skipped it this morning.  Her blood pressure tends to be low for her. Today she has nausea and a mild headache but no fever, diarrhea, sore throat, or cough.       04/26/2024    4:10 PM 11/22/2023   11:22 AM 11/09/2023    3:57 PM  Depression screen PHQ 2/9  Decreased Interest 0 0 0  Down, Depressed, Hopeless 0 0 0  PHQ - 2 Score 0 0 0  Altered sleeping 0  3  Tired, decreased energy 0  3  Change in appetite 0  0  Feeling bad or failure about yourself  0  0  Trouble concentrating 0  0  Moving slowly or fidgety/restless   0  Suicidal thoughts 0  0  PHQ-9 Score 0  6   Difficult doing work/chores Not difficult at all  Not difficult at all      Data saved with a previous flowsheet row definition      11/09/2023    3:57 PM 09/22/2023    8:10 AM 04/11/2023    8:39 AM 03/10/2023   11:01 AM  GAD 7 : Generalized Anxiety Score  Nervous, Anxious, on Edge 0 0 1 2  Control/stop worrying 0 0 1 2  Worry too much - different things 1 0 1 2  Trouble relaxing 0 0 1 2  Restless 1 0 1 2  Easily annoyed or irritable 1 0 2 3  Afraid - awful might happen 0 0 1 2  Total GAD 7 Score 3 0 8 15  Anxiety Difficulty Not difficult at all Not difficult at all Somewhat difficult     Medications: Show/hide medication list[1]  Review of Systems All negative Except see HPI       Objective    BP 99/61   Pulse 81   Resp 14   Ht 5' 6 (1.676 m)   Wt 157 lb 14.4 oz (71.6 kg)   SpO2 100%   BMI 25.49 kg/m     Physical Exam Vitals reviewed.  Constitutional:      General: She is not in acute distress.    Appearance:  Normal appearance. She is well-developed. She is not diaphoretic.  HENT:     Head: Normocephalic and atraumatic.  Eyes:     General: No scleral icterus.    Conjunctiva/sclera: Conjunctivae normal.  Neck:     Thyroid : No thyromegaly.  Cardiovascular:     Rate and Rhythm: Normal rate and regular rhythm.     Pulses: Normal pulses.     Heart sounds: Normal heart sounds. No murmur heard. Pulmonary:     Effort: Pulmonary effort is normal. No respiratory distress.     Breath sounds: Normal breath sounds. No wheezing, rhonchi or rales.  Musculoskeletal:     Cervical back: Neck supple.     Right lower leg: No edema.     Left lower leg: No edema.  Lymphadenopathy:     Cervical: No cervical adenopathy.  Skin:    General: Skin is warm and dry.     Findings: No rash.  Neurological:     Mental Status: She is alert and oriented to person, place, and time. Mental status is at baseline.  Psychiatric:        Mood and Affect: Mood normal.        Behavior: Behavior normal.      Results for orders placed or performed in visit  on 05/21/24  POCT Urinalysis Dipstick  Result Value Ref Range   Color, UA yellow    Clarity, UA clear    Glucose, UA Positive (A) Negative   Bilirubin, UA negative    Ketones, UA negative    Spec Grav, UA 1.010 1.010 - 1.025   Blood, UA negative    pH, UA 6.0 5.0 - 8.0   Protein, UA Negative Negative   Urobilinogen, UA 0.2 0.2 or 1.0 E.U./dL   Nitrite, UA negative    Leukocytes, UA Negative Negative   Appearance     Odor          Assessment & Plan Urinary tract infection Acute  X 3-4 days Symptoms suggestive of UTI despite negative dipstick. No fever or signs of pyelonephritis. - Ordered urine culture and microscopy. - Advised increased water intake. - Instructed symptomatic treatment. Will follow-up  Allergic rhinitis vs sinusitis vs uri Symptoms consistent with allergic rhinitis. Possible viral contribution. Blood pressure low but baseline. - Continue Flonase nasal spray. - Use saline nasal spray or gel. - Consider Claritin or Allegra. - Avoid decongestants. - Use humidifier. - Try warm salt gargles. - Use hot tea with honey. Will follow-up  DMII Chronic and well controlled Continue current DM regimen Follow-up with endocrinology Will follow-up    Orders Placed This Encounter  Procedures   Urine Culture   Urine Microscopic   POCT Urinalysis Dipstick    No follow-ups on file.   The patient was advised to call back or seek an in-person evaluation if the symptoms worsen or if the condition fails to improve as anticipated.  I discussed the assessment and treatment plan with the patient. The patient was provided an opportunity to ask questions and all were answered. The patient agreed with the plan and demonstrated an understanding of the instructions.  I, Makelle Marrone, PA-C have reviewed all documentation for this visit. The documentation on 05/21/2024  for the exam, diagnosis, procedures, and orders are all accurate and complete.  Jolynn Spencer, [facility],  MMS [facility] [phone] (phone) [phone] (fax)  [facility]     [1]  Outpatient Medications Prior to Visit  Medication Sig   ALPRAZolam  (XANAX )  0.5 MG tablet TAKE 1 TAB AT BEDTIME AS NEEDED FOR ANXIETY AND 1/2 TAB DURING THE DAY FOR PANIC ATTACKS. DO NOT TAKE WITH MUSCLE RELAXANTS   atorvastatin (LIPITOR) 40 MG tablet Take 1 tablet by mouth daily.   BD PEN NEEDLE NANO 2ND GEN 32G X 4 MM MISC SMARTSIG:1 Each Daily   Continuous Blood Gluc Transmit (DEXCOM G6 TRANSMITTER) MISC USE TO MONITOR BLOOD SUGARS. CHANGE EVERY 90 DAYS.   Continuous Glucose Sensor (DEXCOM G6 SENSOR) MISC SMARTSIG:1 EACH TOPICAL EVERY 10 DAYS   gabapentin  (NEURONTIN ) 100 MG capsule TAKE 1 CAPSULE BY MOUTH AT BEDTIME.   glimepiride (AMARYL) 4 MG tablet Take 2 mg by mouth.   insulin  aspart protamine - aspart (NOVOLOG MIX 70/30 FLEXPEN) (70-30) 100 UNIT/ML FlexPen INJECT 55 UNITS ONCE DAILY IN THE MORNING, AND 50 UNITS IN THE EVENING. TAKE BEFORE MEALS; Duration: 28   Insulin  Glargine (BASAGLAR KWIKPEN) 100 UNIT/ML Start 10 units once a day.  Titrate by 1 unit daily until fasting blood sugar under 125.  Max daily dose 50 units.   JANUVIA 100 MG tablet Take 100 mg by mouth daily.   levonorgestrel  (MIRENA ) 20 MCG/24HR IUD 1 each by Intrauterine route once.   levothyroxine (SYNTHROID) 75 MCG tablet Take 75 mcg by mouth daily.   lisinopril (ZESTRIL) 5 MG tablet Take by mouth.   metFORMIN (GLUCOPHAGE-XR) 500 MG 24 hr tablet 1 tablet with evening meal Orally Twice a day; Duration: 30 day(s)   metoprolol  succinate (TOPROL -XL) 50 MG 24 hr tablet TAKE 1 TABLET BY MOUTH EVERY DAY   pantoprazole  (PROTONIX ) 40 MG tablet Take 1 tablet (40 mg total) by mouth daily.   saxagliptin HCl (ONGLYZA) 5 MG TABS tablet Take 5 mg by mouth daily.   sertraline  (ZOLOFT ) 100 MG tablet TAKE 1 TABLET BY MOUTH EVERY DAY   SYNJARDY XR 12.10-998 MG TB24 Take 2 tablets by mouth every morning.   [DISCONTINUED]  amoxicillin -clavulanate (AUGMENTIN ) 875-125 MG tablet Take 1 tablet by mouth 2 (two) times daily.   [DISCONTINUED] Atogepant 60 MG TABS Take 60 mg by mouth.   [DISCONTINUED] methylPREDNISolone  (MEDROL  DOSEPAK) 4 MG TBPK tablet Take per packet instructions   [DISCONTINUED] ofloxacin  (OCUFLOX ) 0.3 % ophthalmic solution Place 1 drop into the left eye 4 (four) times daily.   No facility-administered medications prior to visit.   "

## 2024-05-21 NOTE — Telephone Encounter (Signed)
 FYI Only or Action Required?: FYI only for provider: appointment scheduled on today.  Patient was last seen in primary care on 04/26/2024 by Ostwalt, Janna, PA-C.  Called Nurse Triage reporting Urinary Tract Infection.  Symptoms began several days ago.  Interventions attempted: Rest, hydration, or home remedies.  Symptoms are: gradually worsening.  Triage Disposition: See Physician Within 24 Hours  Patient/caregiver understands and will follow disposition?: Yes, will follow disposition  Copied from CRM #[id number]. Topic: Clinical - Red Word Triage >> May 21, 2024  8:04 AM Ivette P wrote: Red Word that prompted transfer to Nurse Triage: pain - burning.  overall discomfort nauseated, headache. Reason for Disposition  All other patients with painful urination  (Exception: [1] EITHER frequency or urgency AND [2] has on-call doctor.)  Answer Assessment - Initial Assessment Questions 1. SEVERITY: How bad is the pain?  (e.g., Scale 1-10; mild, moderate, or severe)     6 2. FREQUENCY: How many times have you had painful urination today?      Voiding excessive amount 3. PATTERN: Is pain present every time you urinate or just sometimes?      Each time she voids 4. ONSET: When did the painful urination start?      About 2 days 5. FEVER: Do you have a fever? If Yes, ask: What is your temperature, how was it measured, and when did it start?     denies 6. PAST UTI: Have you had a urine infection before? If Yes, ask: When was the last time? and What happened that time?      Hx 7. CAUSE: What do you think is causing the painful urination?  (e.g., UTI, scratch, Herpes sore)     UTI 8. OTHER SYMPTOMS: Do you have any other symptoms? (e.g., blood in urine, flank pain, genital sores, urgency, vaginal discharge)     Nausea, odor to urine, denies hematuria, denies fever, denies back pain  Protocols used: Urination Pain - Female-A-AH

## 2024-05-21 NOTE — Telephone Encounter (Signed)
 Noted

## 2024-05-22 ENCOUNTER — Ambulatory Visit: Payer: Self-pay | Admitting: Physician Assistant

## 2024-05-22 LAB — URINALYSIS, MICROSCOPIC ONLY
Bacteria, UA: NONE SEEN
Casts: NONE SEEN /LPF

## 2024-05-25 ENCOUNTER — Telehealth: Payer: Self-pay

## 2024-05-25 LAB — URINE CULTURE

## 2024-05-25 MED ORDER — CEPHALEXIN 500 MG PO CAPS: 500.0000 mg | ORAL_CAPSULE | Freq: Two times a day (BID) | ORAL | 0 refills | Status: DC

## 2024-05-25 NOTE — Telephone Encounter (Unsigned)
 Copied from CRM #[id number]. Topic: Clinical - Medical Advice >> May 25, 2024  9:35 AM Delon HERO wrote: Reason for CRM: Patient is calling to report that she still having UTI symptoms with burning & pain. Calling to receive urine culture results. However not back. Starting the AZO 05/22/24- advised by nurse unable to give another UA because she has started the AZO. Please advise

## 2024-05-25 NOTE — Telephone Encounter (Signed)
 Noted

## 2024-05-26 ENCOUNTER — Other Ambulatory Visit: Payer: Self-pay | Admitting: Family Medicine

## 2024-05-26 DIAGNOSIS — F320 Major depressive disorder, single episode, mild: Secondary | ICD-10-CM

## 2024-05-28 NOTE — Telephone Encounter (Signed)
 Duplicate encounter. Patient was advised through my chart by provider.

## 2024-06-03 ENCOUNTER — Other Ambulatory Visit: Payer: Self-pay | Admitting: Family Medicine

## 2024-06-03 DIAGNOSIS — F411 Generalized anxiety disorder: Secondary | ICD-10-CM

## 2024-06-19 ENCOUNTER — Ambulatory Visit: Payer: PRIVATE HEALTH INSURANCE | Admitting: Physician Assistant

## 2024-06-19 DIAGNOSIS — E039 Hypothyroidism, unspecified: Secondary | ICD-10-CM

## 2024-06-19 DIAGNOSIS — Z1159 Encounter for screening for other viral diseases: Secondary | ICD-10-CM

## 2024-06-19 DIAGNOSIS — Z1211 Encounter for screening for malignant neoplasm of colon: Secondary | ICD-10-CM

## 2024-06-19 DIAGNOSIS — Z23 Encounter for immunization: Secondary | ICD-10-CM

## 2024-06-19 DIAGNOSIS — R300 Dysuria: Secondary | ICD-10-CM

## 2024-06-19 DIAGNOSIS — Z114 Encounter for screening for human immunodeficiency virus [HIV]: Secondary | ICD-10-CM

## 2024-06-19 DIAGNOSIS — E119 Type 2 diabetes mellitus without complications: Secondary | ICD-10-CM

## 2024-06-20 NOTE — Progress Notes (Signed)
 "  Chief Complaint  Patient presents with   Gynecologic Exam    Sweating all the time, hot flashes, having trouble sleeping   Urinary Tract Infection    Uncomfortable feeling when voiding, recent UTI     HPI:      Ms. Lori JEZEWSKI is a 49 y.o. [id number] who LMP was No LMP recorded. (Menstrual status: IUD)., presents today for her annual examination.  Her menses are absent with IUD. Pt had DUB in past with endometrial mass on u/s, s/p hysteroscopy/D&C/polypectomy and new IUD placement with Dr. Leonce 10/18. Doing well. No BTB/dysmen. Has vasomotor sx, trouble sleeping, brain fog, waves of nausea, HA. Also with fairly controlled DM. No concern about lymphoma per pt.   Had E. Coli on C&S 12/25 with PCP, treated with keflex  with mostly sx improvement. Still having pelvic discomfort with voiding, still with some frequency and urgency but also drinks lots of water.   Sex activity: single partner, contraception - IUD. Mirena  placed 03/10/17. Has dryness, improved with lubricants.   Last Pap: 08/16/22 Results were: no abnormalities /neg HPV DNA  Hx of STDs: none  Last mammogram: pt gets mammos at [facility] yearly for hx of Breast mass/bx. Last mammo 06/02/23 was normal, has appt 2/26. Hx of Birads 3 3/21, breast MRI done 9/21 for RT breast was cat 2.  There is no FH of breast cancer. There is no FH of ovarian cancer. The patient does do self-breast exams.  Tobacco use: The patient denies current or previous tobacco use. Alcohol use: social No drug use Exercise: min active  Colonoscopy: 4/20 and 10/26/21 at [facility] with polyps; repeat due in 1 yr; pt high risk for CRC due to hx of para-aortic/splenic pedicle radiation for Hodgkins lymphoma age 23.   She does get adequate calcium and Vitamin D  in her diet. Diagnosed with Vit D deficiency in past.  She has labs with PCP. Followed by oncology for hx of Hodgkins lymphoma and cardiology and endocrinology at [facility].    Past Medical History:  Diagnosis Date    Bacterial vaginitis    Cancer (HCC) 1987   Hodgkins Lymphoma   CHF (congestive heart failure) (HCC)    Colon polyps    Complication of anesthesia    nausea/vomiting   Depression    Diabetes mellitus without complication (HCC)    History of abnormal mammogram 2013   @[facility] - NEG MRI AND BX OCT 2014 NORMAL   History of mammogram 03/2015   BREAST MRI AT [facility] WNL   History of Papanicolaou smear of cervix 10/08/2013; 08/04/15   -/-; ASCUS/HPV NEG;   Hodgkin's disease (HCC) 1987   She had surgical resection of Lymph nodes and chemo + rad tx's.   Hypertension    Hypothyroidism    2ND TO CA TX   PONV (postoperative nausea and vomiting)    Restrictive lung disease    Skin cancer may 2025   place on back   Vitamin D  deficiency     Past Surgical History:  Procedure Laterality Date   APPENDECTOMY     CHOLECYSTECTOMY     DEEP NECK LYMPH NODE BIOPSY / EXCISION  1987   hodgkin lymphoma   DILATATION & CURETTAGE/HYSTEROSCOPY WITH MYOSURE N/A 03/10/2017   Procedure: DILATATION & CURETTAGE/HYSTEROSCOPY WITH MYOSURE;  Surgeon: Leonce Garnette BIRCH, MD;  Location: [facility] ORS;  Service: Gynecology;  Laterality: N/A;   INTRAUTERINE DEVICE (IUD) INSERTION  [id number]; 02/06/09; 12/12/13   INTRAUTERINE DEVICE (IUD) INSERTION N/A 03/10/2017  Procedure: INTRAUTERINE DEVICE (IUD) INSERTION;  Surgeon: Leonce Garnette BIRCH, MD;  Location: [facility] ORS;  Service: Gynecology;  Laterality: N/A;   SPLENECTOMY     THYROIDECTOMY      Family History  Problem Relation Age of Onset   Hypertension Mother    Other Maternal Grandfather        bile duct cancer   Colon cancer Paternal Grandmother        58-70    Social History   Socioeconomic History   Marital status: Married    Spouse name: Not on file   Number of children: Not on file   Years of education: 12   Highest education level: GED or equivalent  Occupational History   Occupation: QUOTATION [employer]    Employer: [employer]  Tobacco Use   Smoking  status: Former    Current packs/day: 0.00    Average packs/day: 0.3 packs/day for 11.7 years (3.0 ttl pk-yrs)    Types: Cigarettes    Start date: 03/10/2006    Quit date: 03/10/2016    Years since quitting: 8.2   Smokeless tobacco: Never  Vaping Use   Vaping status: Never Used  Substance and Sexual Activity   Alcohol use: Yes    Alcohol/week: 2.0 standard drinks of alcohol    Types: 2 Glasses of wine per week    Comment: occasional   Drug use: No   Sexual activity: Yes    Birth control/protection: I.U.D.    Comment: Mirena   Other Topics Concern   Not on file  Social History Narrative   Not on file   Social Drivers of Health   Tobacco Use: Medium Risk (06/21/2024)   Patient History    Smoking Tobacco Use: Former    Smokeless Tobacco Use: Never    Passive Exposure: Not on file  Financial Resource Strain: Low Risk (11/22/2023)   Overall Financial Resource Strain (CARDIA)    Difficulty of Paying Living Expenses: Not hard at all  Food Insecurity: No Food Insecurity (11/22/2023)   Epic    Worried About Programme Researcher, Broadcasting/film/video in the Last Year: Never true    Ran Out of Food in the Last Year: Never true  Transportation Needs: No Transportation Needs (11/22/2023)   Epic    Lack of Transportation (Medical): No    Lack of Transportation (Non-Medical): No  Physical Activity: Insufficiently Active (03/08/2023)   Exercise Vital Sign    Days of Exercise per Week: 1 day    Minutes of Exercise per Session: 10 min  Stress: No Stress Concern Present (11/09/2023)   [employer] of Occupational Health - Occupational Stress Questionnaire    Feeling of Stress : Not at all  Social Connections: Socially Integrated (03/08/2023)   Social Connection and Isolation Panel    Frequency of Communication with Friends and Family: More than three times a week    Frequency of Social Gatherings with Friends and Family: Three times a week    Attends Religious Services: More than 4 times per year    Active  Member of Clubs or Organizations: Yes    Attends Banker Meetings: More than 4 times per year    Marital Status: Married  Catering Manager Violence: Not At Risk (11/22/2023)   Epic    Fear of Current or Ex-Partner: No    Emotionally Abused: No    Physically Abused: No    Sexually Abused: No  Depression (PHQ2-9): Low Risk (04/26/2024)   Depression (PHQ2-9)    PHQ-2  Score: 0  Alcohol Screen: Low Risk (11/09/2023)   Alcohol Screen    Last Alcohol Screening Score (AUDIT): 2  Housing: Unknown (11/22/2023)   Epic    Unable to Pay for Housing in the Last Year: No    Number of Times Moved in the Last Year: Not on file    Homeless in the Last Year: No  Utilities: Not At Risk (11/22/2023)   Epic    Threatened with loss of utilities: No  Health Literacy: Adequate Health Literacy (11/09/2023)   B1300 Health Literacy    Frequency of need for help with medical instructions: Never     Current Outpatient Medications:    ALPRAZolam  (XANAX ) 0.5 MG tablet, TAKE 1 TAB AT BEDTIME AS NEEDED FOR ANXIETY AND 1/2 TAB DURING THE DAY FOR PANIC ATTACKS. DO NOT TAKE WITH MUSCLE RELAXANTS, Disp: 30 tablet, Rfl: 0   atorvastatin (LIPITOR) 40 MG tablet, Take 1 tablet by mouth daily., Disp: , Rfl:    BD PEN NEEDLE NANO 2ND GEN 32G X 4 MM MISC, SMARTSIG:1 Each Daily, Disp: , Rfl:    Continuous Blood Gluc Transmit (DEXCOM G6 TRANSMITTER) MISC, USE TO MONITOR BLOOD SUGARS. CHANGE EVERY 90 DAYS., Disp: , Rfl:    Continuous Glucose Sensor (DEXCOM G6 SENSOR) MISC, SMARTSIG:1 EACH TOPICAL EVERY 10 DAYS, Disp: 9 each, Rfl: 0   gabapentin  (NEURONTIN ) 100 MG capsule, TAKE 1 CAPSULE BY MOUTH AT BEDTIME., Disp: 90 capsule, Rfl: 2   glimepiride (AMARYL) 4 MG tablet, Take 2 mg by mouth., Disp: , Rfl:    insulin  aspart protamine - aspart (NOVOLOG MIX 70/30 FLEXPEN) (70-30) 100 UNIT/ML FlexPen, INJECT 55 UNITS ONCE DAILY IN THE MORNING, AND 50 UNITS IN THE EVENING. TAKE BEFORE MEALS; Duration: 28, Disp: , Rfl:    Insulin   Glargine (BASAGLAR KWIKPEN) 100 UNIT/ML, Start 10 units once a day.  Titrate by 1 unit daily until fasting blood sugar under 125.  Max daily dose 50 units., Disp: , Rfl:    JANUVIA 100 MG tablet, Take 100 mg by mouth daily., Disp: , Rfl:    levonorgestrel  (MIRENA ) 20 MCG/24HR IUD, 1 each by Intrauterine route once., Disp: , Rfl:    levothyroxine (SYNTHROID) 75 MCG tablet, Take 75 mcg by mouth daily., Disp: , Rfl:    lisinopril (ZESTRIL) 5 MG tablet, Take by mouth., Disp: , Rfl:    metFORMIN (GLUCOPHAGE-XR) 500 MG 24 hr tablet, 1 tablet with evening meal Orally Twice a day; Duration: 30 day(s), Disp: , Rfl:    metoprolol  succinate (TOPROL -XL) 50 MG 24 hr tablet, TAKE 1 TABLET BY MOUTH EVERY DAY, Disp: 90 tablet, Rfl: 1   pantoprazole  (PROTONIX ) 40 MG tablet, Take 1 tablet (40 mg total) by mouth daily., Disp: 90 tablet, Rfl: 1   saxagliptin HCl (ONGLYZA) 5 MG TABS tablet, Take 5 mg by mouth daily., Disp: , Rfl:    sertraline  (ZOLOFT ) 100 MG tablet, TAKE 1 TABLET BY MOUTH EVERY DAY, Disp: 90 tablet, Rfl: 0   SYNJARDY XR 12.10-998 MG TB24, Take 2 tablets by mouth every morning., Disp: , Rfl:   ROS:  Review of Systems  Constitutional:  Negative for fatigue, fever and unexpected weight change.  Respiratory:  Negative for cough, shortness of breath and wheezing.   Cardiovascular:  Negative for chest pain, palpitations and leg swelling.  Gastrointestinal:  Negative for blood in stool, constipation, diarrhea, nausea and vomiting.  Endocrine: Negative for cold intolerance, heat intolerance and polyuria.  Genitourinary:  Positive for frequency. Negative for dyspareunia,  dysuria, flank pain, genital sores, hematuria, menstrual problem, pelvic pain, urgency, vaginal bleeding and vaginal discharge.  Musculoskeletal:  Negative for back pain, joint swelling and myalgias.  Skin:  Negative for rash.  Neurological:  Positive for headaches. Negative for dizziness, syncope, light-headedness and numbness.   Hematological:  Negative for adenopathy.  Psychiatric/Behavioral:  Negative for agitation, confusion, sleep disturbance and suicidal ideas. The patient is not nervous/anxious.      Objective: BP 104/68   Pulse 84   Ht 5' 6 (1.676 m)   Wt 160 lb (72.6 kg)   BMI 25.82 kg/m    Physical Exam Constitutional:      Appearance: She is well-developed.  Genitourinary:     Vulva normal.     Right Labia: No rash, tenderness or lesions.    Left Labia: No tenderness, lesions or rash.    No vaginal discharge, erythema or tenderness.      Right Adnexa: not tender and no mass present.    Left Adnexa: not tender and no mass present.    No cervical motion tenderness, friability or polyp.     IUD strings visualized.     Uterus is not enlarged or tender.  Breasts:    Right: No mass, nipple discharge, skin change or tenderness.     Left: No mass, nipple discharge, skin change or tenderness.  Neck:     Thyroid : No thyromegaly.  Cardiovascular:     Rate and Rhythm: Normal rate and regular rhythm.     Heart sounds: Normal heart sounds. No murmur heard. Pulmonary:     Effort: Pulmonary effort is normal.     Breath sounds: Normal breath sounds.  Abdominal:     Palpations: Abdomen is soft.     Tenderness: There is no abdominal tenderness. There is no guarding or rebound.  Musculoskeletal:        General: Normal range of motion.     Cervical back: Normal range of motion.  Lymphadenopathy:     Cervical: No cervical adenopathy.  Neurological:     General: No focal deficit present.     Mental Status: She is alert and oriented to person, place, and time.     Cranial Nerves: No cranial nerve deficit.  Skin:    General: Skin is warm and dry.  Psychiatric:        Mood and Affect: Mood normal.        Behavior: Behavior normal.        Thought Content: Thought content normal.        Judgment: Judgment normal.  Vitals reviewed.    Results for orders placed or performed in visit on 06/21/24  (from the past 24 hours)  POCT Urinalysis Dipstick     Status: Abnormal   Collection Time: 06/21/24  4:51 PM  Result Value Ref Range   Color, UA yellow    Clarity, UA clear    Glucose, UA Positive (A) Negative   Bilirubin, UA neg    Ketones, UA neg    Spec Grav, UA 1.015 1.010 - 1.025   Blood, UA neg    pH, UA 5.0 5.0 - 8.0   Protein, UA Negative Negative   Urobilinogen, UA     Nitrite, UA neg    Leukocytes, UA Negative Negative   Appearance     Odor       Assessment/Plan: Encounter for annual routine gynecological examination  Encounter for routine checking of intrauterine contraceptive device (IUD)--IUD strings in cx os, has 8  yr indication. Discussed removal/replacement 10/26. Checking for menopause with labs.   Encounter for screening mammogram for malignant neoplasm of breast; pt has appt 2/26  Screening for colon cancer - Plan: Ambulatory referral to Gastroenterology; refer back to [facility]  UTI symptoms - Plan: POCT Urinalysis Dipstick, Urine Culture; neg UA, check C&S. Will f/u if abn.   Menopause - Plan: FSH/LH, Estradiol ; check labs.  Perimenopausal vasomotor symptoms--discussed HRT vs veozah. Pt to discuss with cardiologist first to see if qualifies. Pt to f/u via MyChart after her upcoming appt with him.    GYN counsel adequate intake of calcium and vitamin D      F/U  Return in about 1 year (around 06/21/2025).  Sukaina Toothaker B. Violia Knopf, PA-C 06/21/2024 4:52 PM "

## 2024-06-21 ENCOUNTER — Ambulatory Visit (INDEPENDENT_AMBULATORY_CARE_PROVIDER_SITE_OTHER): Payer: PRIVATE HEALTH INSURANCE | Admitting: Obstetrics and Gynecology

## 2024-06-21 ENCOUNTER — Encounter: Payer: Self-pay | Admitting: Obstetrics and Gynecology

## 2024-06-21 VITALS — BP 104/68 | HR 84 | Ht 66.0 in | Wt 160.0 lb

## 2024-06-21 DIAGNOSIS — Z30431 Encounter for routine checking of intrauterine contraceptive device: Secondary | ICD-10-CM

## 2024-06-21 DIAGNOSIS — R399 Unspecified symptoms and signs involving the genitourinary system: Secondary | ICD-10-CM | POA: Diagnosis not present

## 2024-06-21 DIAGNOSIS — Z1211 Encounter for screening for malignant neoplasm of colon: Secondary | ICD-10-CM

## 2024-06-21 DIAGNOSIS — Z01411 Encounter for gynecological examination (general) (routine) with abnormal findings: Secondary | ICD-10-CM | POA: Diagnosis not present

## 2024-06-21 DIAGNOSIS — Z975 Presence of (intrauterine) contraceptive device: Secondary | ICD-10-CM | POA: Diagnosis not present

## 2024-06-21 DIAGNOSIS — Z01419 Encounter for gynecological examination (general) (routine) without abnormal findings: Secondary | ICD-10-CM

## 2024-06-21 DIAGNOSIS — Z780 Asymptomatic menopausal state: Secondary | ICD-10-CM | POA: Diagnosis not present

## 2024-06-21 DIAGNOSIS — Z1231 Encounter for screening mammogram for malignant neoplasm of breast: Secondary | ICD-10-CM

## 2024-06-21 DIAGNOSIS — N951 Menopausal and female climacteric states: Secondary | ICD-10-CM

## 2024-06-21 LAB — POCT URINALYSIS DIPSTICK
Bilirubin, UA: NEGATIVE
Blood, UA: NEGATIVE
Glucose, UA: POSITIVE — AB
Ketones, UA: NEGATIVE
Leukocytes, UA: NEGATIVE
Nitrite, UA: NEGATIVE
Protein, UA: NEGATIVE
Spec Grav, UA: 1.015
pH, UA: 5

## 2024-06-21 NOTE — Patient Instructions (Signed)
 I value your feedback and you entrusting [place] with your care. If you get a [place] patient survey, I would appreciate you taking the time to let us know about your experience today. Thank you! ? ? ?

## 2024-06-22 ENCOUNTER — Ambulatory Visit: Payer: Self-pay | Admitting: Obstetrics and Gynecology

## 2024-06-22 LAB — ESTRADIOL: Estradiol: <5 pg/mL

## 2024-06-22 LAB — FSH/LH
FSH: 51.2 m[IU]/mL
LH: 28 m[IU]/mL

## 2024-06-24 LAB — URINE CULTURE: Organism ID, Bacteria: NO GROWTH
